# Patient Record
Sex: Female | Born: 1940 | Race: White | Hispanic: No | Marital: Single | Smoking: Never smoker
Health system: Southern US, Community
[De-identification: ages and names within clinical notes are randomized; demographics above are authoritative.]

## PROBLEM LIST (undated history)

## (undated) DIAGNOSIS — F329 Major depressive disorder, single episode, unspecified: Secondary | ICD-10-CM

## (undated) DIAGNOSIS — E079 Disorder of thyroid, unspecified: Secondary | ICD-10-CM

## (undated) DIAGNOSIS — F419 Anxiety disorder, unspecified: Secondary | ICD-10-CM

## (undated) DIAGNOSIS — K219 Gastro-esophageal reflux disease without esophagitis: Secondary | ICD-10-CM

## (undated) DIAGNOSIS — M4800 Spinal stenosis, site unspecified: Secondary | ICD-10-CM

## (undated) DIAGNOSIS — E785 Hyperlipidemia, unspecified: Secondary | ICD-10-CM

## (undated) DIAGNOSIS — F32A Depression, unspecified: Secondary | ICD-10-CM

## (undated) DIAGNOSIS — F319 Bipolar disorder, unspecified: Secondary | ICD-10-CM

## (undated) DIAGNOSIS — A6000 Herpesviral infection of urogenital system, unspecified: Secondary | ICD-10-CM

## (undated) DIAGNOSIS — E559 Vitamin D deficiency, unspecified: Secondary | ICD-10-CM

## (undated) HISTORY — PX: HAMMER TOE SURGERY: SHX385

## (undated) HISTORY — PX: NASAL SINUS SURGERY: SHX719

## (undated) HISTORY — DX: Vitamin D deficiency, unspecified: E55.9

## (undated) HISTORY — PX: ABDOMINAL HYSTERECTOMY: SHX81

## (undated) HISTORY — DX: Anxiety disorder, unspecified: F41.9

## (undated) HISTORY — DX: Hyperlipidemia, unspecified: E78.5

---

## 2010-01-07 ENCOUNTER — Ambulatory Visit: Payer: Self-pay | Admitting: Family Medicine

## 2010-02-09 ENCOUNTER — Ambulatory Visit: Payer: Self-pay | Admitting: Family Medicine

## 2010-02-26 ENCOUNTER — Ambulatory Visit: Payer: Self-pay | Admitting: Internal Medicine

## 2010-03-02 ENCOUNTER — Emergency Department: Payer: Self-pay | Admitting: Emergency Medicine

## 2011-02-28 ENCOUNTER — Other Ambulatory Visit (HOSPITAL_COMMUNITY): Payer: Self-pay | Admitting: Podiatry

## 2011-02-28 DIAGNOSIS — M79671 Pain in right foot: Secondary | ICD-10-CM

## 2011-03-09 ENCOUNTER — Encounter (HOSPITAL_COMMUNITY)
Admission: RE | Admit: 2011-03-09 | Discharge: 2011-03-09 | Disposition: A | Discharge status: Home / Self Care | Payer: Medicare Other | Source: Ambulatory Visit | Attending: Podiatry | Admitting: Podiatry

## 2011-03-09 ENCOUNTER — Ambulatory Visit (HOSPITAL_COMMUNITY): Payer: Medicare Other

## 2011-03-09 ENCOUNTER — Other Ambulatory Visit (HOSPITAL_COMMUNITY): Payer: Self-pay | Admitting: Podiatry

## 2011-03-09 ENCOUNTER — Ambulatory Visit (HOSPITAL_COMMUNITY)
Admission: RE | Admit: 2011-03-09 | Discharge: 2011-03-09 | Disposition: A | Discharge status: Home / Self Care | Payer: Medicare Other | Source: Ambulatory Visit | Attending: Podiatry | Admitting: Podiatry

## 2011-03-09 DIAGNOSIS — R52 Pain, unspecified: Secondary | ICD-10-CM

## 2011-03-09 DIAGNOSIS — M79609 Pain in unspecified limb: Secondary | ICD-10-CM | POA: Insufficient documentation

## 2011-03-09 DIAGNOSIS — M899 Disorder of bone, unspecified: Secondary | ICD-10-CM | POA: Insufficient documentation

## 2011-03-09 DIAGNOSIS — M79671 Pain in right foot: Secondary | ICD-10-CM

## 2011-03-09 DIAGNOSIS — M949 Disorder of cartilage, unspecified: Secondary | ICD-10-CM | POA: Insufficient documentation

## 2011-03-09 DIAGNOSIS — M79673 Pain in unspecified foot: Secondary | ICD-10-CM

## 2011-03-09 MED ORDER — TECHNETIUM TC 99M MEDRONATE IV KIT
23.4000 | PACK | Freq: Once | INTRAVENOUS | Status: AC | PRN
  Administered 2011-03-09: 23.4 via INTRAVENOUS

## 2012-03-22 ENCOUNTER — Other Ambulatory Visit: Payer: Self-pay | Admitting: Family Medicine

## 2012-03-22 DIAGNOSIS — M545 Low back pain: Secondary | ICD-10-CM

## 2012-03-27 ENCOUNTER — Other Ambulatory Visit: Payer: Medicare Other

## 2012-07-13 ENCOUNTER — Encounter (HOSPITAL_COMMUNITY): Payer: Self-pay | Admitting: Emergency Medicine

## 2012-07-13 ENCOUNTER — Emergency Department (HOSPITAL_COMMUNITY): Payer: Medicare Other

## 2012-07-13 ENCOUNTER — Emergency Department (HOSPITAL_COMMUNITY)
Admission: EM | Admit: 2012-07-13 | Discharge: 2012-07-13 | Disposition: A | Discharge status: Home / Self Care | Payer: Medicare Other | Attending: Emergency Medicine | Admitting: Emergency Medicine

## 2012-07-13 DIAGNOSIS — Z79899 Other long term (current) drug therapy: Secondary | ICD-10-CM | POA: Insufficient documentation

## 2012-07-13 DIAGNOSIS — Y9389 Activity, other specified: Secondary | ICD-10-CM | POA: Insufficient documentation

## 2012-07-13 DIAGNOSIS — W19XXXA Unspecified fall, initial encounter: Secondary | ICD-10-CM | POA: Insufficient documentation

## 2012-07-13 DIAGNOSIS — Z8659 Personal history of other mental and behavioral disorders: Secondary | ICD-10-CM | POA: Insufficient documentation

## 2012-07-13 DIAGNOSIS — S0993XA Unspecified injury of face, initial encounter: Secondary | ICD-10-CM | POA: Insufficient documentation

## 2012-07-13 DIAGNOSIS — S0990XA Unspecified injury of head, initial encounter: Secondary | ICD-10-CM | POA: Insufficient documentation

## 2012-07-13 DIAGNOSIS — Z90710 Acquired absence of both cervix and uterus: Secondary | ICD-10-CM | POA: Insufficient documentation

## 2012-07-13 DIAGNOSIS — S199XXA Unspecified injury of neck, initial encounter: Secondary | ICD-10-CM | POA: Insufficient documentation

## 2012-07-13 DIAGNOSIS — Y92009 Unspecified place in unspecified non-institutional (private) residence as the place of occurrence of the external cause: Secondary | ICD-10-CM | POA: Insufficient documentation

## 2012-07-13 HISTORY — DX: Disorder of thyroid, unspecified: E07.9

## 2012-07-13 MED ORDER — OXYCODONE-ACETAMINOPHEN 5-325 MG PO TABS
1.0000 | ORAL_TABLET | Freq: Once | ORAL | Status: AC
  Administered 2012-07-13: 1 via ORAL
  Filled 2012-07-13: qty 1

## 2012-07-13 MED ORDER — OXYCODONE-ACETAMINOPHEN 5-325 MG PO TABS: 2.0000 | ORAL_TABLET | ORAL | Status: DC | PRN

## 2012-07-13 NOTE — Progress Notes (Signed)
[facility] ED CM consulted by ED SW for assist with home health services. Pt states pcp, Pam Jackson, sent a RN to her home only for a home safety evaluation. Pt does not know the name of the agency and would prefer to use the same agency ordered previously by pcp.  She has agreed to home health RN, PT/OT/aide and SW.  Sister, Pam Jackson,  also at bedsides states she will continue to assist pt.  Pt has another sister locally but her children are not local.  Cm reviewed information for home health services, differences in home health and private duty nursing, private duty nursing agencies, Medicare admission guidelines, coverages for all levels of care discussed and rehab snf programs. CM reviewed this ED visit labs and imaging.  EDP also reviewed these results pt per sister. Pt states she lives alone CM recommend rehab snf as option for strengthening.  CM provided lists for [place] home health agencies, assisted living, snfs and private duty nursing agencies Pt noted with some confusion She told Cm she did not live in [place] but when sister came in room she stated she did live in [place] Pt is able to tell CM she is in Emergency room and today is "Friday" 1230 CM spoke with Dr Pam Jackson who reports pt as being uncooperative with her treatment plan Reports pt refused for [facility] to return to her home after first [facility] visit PCP has referred pt to neurology for memory and falling concerns. PCP agrees pt may benefit from services PCP has spoken with sister, Pam Jackson on 07/12/12 evening in details

## 2012-07-13 NOTE — ED Notes (Signed)
Per EMS chronic falls, fell yesterday-chronic right leg pain-no loss of consciousness, did not hit head-bruise on right upper chest

## 2012-07-13 NOTE — ED Provider Notes (Addendum)
History     CSN: [id number]  Arrival date & time 07/13/12  0930   First MD Initiated Contact with Patient 07/13/12 0945      Chief Complaint  Patient presents with  . Fall    (Consider location/radiation/quality/duration/timing/severity/associated sxs/prior treatment) The history is provided by the patient.  Pam Jackson is a 72 y.o. female history of hysterectomy, schizophrenia, here s/p fall. She falls frequently. Yesterday, she was at home and had a mechanical fall. + head injury and neck pain. No LOC or syncope. Not on anticoagulants. No headaches.    No past medical history on file.  Past Surgical History  Procedure Date  . Abdominal hysterectomy   . Hammer toe surgery     No family history on file.  History  Substance Use Topics  . Smoking status: Not on file  . Smokeless tobacco: Not on file  . Alcohol Use: No    OB History    Grav Para Term Preterm Abortions TAB SAB Ect Mult Living                  Review of Systems  Musculoskeletal:       Neck pain   All other systems reviewed and are negative.    Allergies  Review of patient's allergies indicates no known allergies.  Home Medications   Current Outpatient Rx  Name  Route  Sig  Dispense  Refill  . HYDROCODONE-ACETAMINOPHEN 7.5-325 MG PO TABS   Oral   Take 1 tablet by mouth every 4 (four) hours as needed.         Marland Kitchen LEVOTHYROXINE SODIUM 50 MCG PO TABS   Oral   Take 50 mcg by mouth daily.         . MELOXICAM 15 MG PO TABS   Oral   Take 15 mg by mouth daily.         Marland Kitchen METHOCARBAMOL 500 MG PO TABS   Oral   Take 500 mg by mouth 3 (three) times daily.         . TRAMADOL HCL 50 MG PO TABS   Oral   Take 50 mg by mouth every 6 (six) hours as needed.         . OXYCODONE-ACETAMINOPHEN 5-325 MG PO TABS   Oral   Take 2 tablets by mouth every 4 (four) hours as needed for pain.   15 tablet   0     BP 130/66  Pulse 85  Temp 98.4 F (36.9 C) (Oral)  Resp 18  SpO2  93%  Physical Exam  Nursing note and vitals reviewed. Constitutional: She is oriented to person, place, and time. She appears well-developed and well-nourished.       NAD   HENT:  Head: Normocephalic.  Mouth/Throat: Oropharynx is clear and moist.       No scalp hematoma   Eyes: Conjunctivae normal are normal. Pupils are equal, round, and reactive to light.  Neck: Normal range of motion.       ? Midline tenderness, dec ROM from pain   Cardiovascular: Normal rate, regular rhythm and normal heart sounds.   Pulmonary/Chest: Effort normal and breath sounds normal. No respiratory distress. She has no wheezes. She has no rales.       No palpable tenderness   Abdominal: Bowel sounds are normal. She exhibits no distension. There is no tenderness. There is no rebound.  Musculoskeletal: Normal range of motion.  Neurological: She is alert and oriented to person, place,  and time.       Nl strength and sensation throughout   Skin: Skin is warm and dry.  Psychiatric: She has a normal mood and affect. Her behavior is normal. Judgment and thought content normal.    ED Course  Procedures (including critical care time)  Labs Reviewed - No data to display Dg Chest 2 View  07/13/2012  *RADIOLOGY REPORT*  Clinical Data: Fall.  Right upper chest pain.  CHEST - 2 VIEW  Comparison: None.  Findings: Cardiac and mediastinal contours appear normal.  The lungs appear clear.  No pleural effusion is identified.  IMPRESSION:  No significant abnormality identified.   Original Report Authenticated By: Gaylyn Rong, M.D.    Ct Head Wo Contrast  07/13/2012  *RADIOLOGY REPORT*  Clinical Data:  Fall  CT HEAD WITHOUT CONTRAST CT CERVICAL SPINE WITHOUT CONTRAST  Technique:  Multidetector CT imaging of the head and cervical spine was performed following the standard protocol without intravenous contrast.  Multiplanar CT image reconstructions of the cervical spine were also generated.  Comparison:   None  CT HEAD   Findings: Chronic ischemic changes in the periventricular white matter and left basal ganglia.  Global atrophy.  No mass effect, midline shift, or acute intracranial hemorrhage. Mastoid air cells and visualized paranasal sinuses are clear.  IMPRESSION: No acute intracranial pathology.  CT CERVICAL SPINE  Findings: Advanced degenerative changes throughout the cervical spine are noted.  There is rotation of C1 upon C2.  There is severe multilevel facet arthropathy worse on the left. This involves C3-4 through C7-T1.  Anterolisthesis at C4-5 there is a 3 mm without evidence of dislocation or fracture. Anterior displacement of the right C4 articular facet with respect to that of C5 is present which may be chronic.  Severe narrowing of the C5-6 and C6-7 is present.  Posterior osteophytes occur at these levels.  Right foraminal stenosis occurs at C6-7 secondary to uncovertebral osteophytes.  IMPRESSION: No evidence of acute fracture in the cervical spine.  There is a 3 mm anterolisthesis of C4 upon C5 as described without obvious dislocation.  Soft tissue injury with instability of the ligamentous structures cannot be excluded.  Flexion and extension views are recommended.   Original Report Authenticated By: Jolaine Click, M.D.    Ct Cervical Spine Wo Contrast  07/13/2012  *RADIOLOGY REPORT*  Clinical Data:  Fall  CT HEAD WITHOUT CONTRAST CT CERVICAL SPINE WITHOUT CONTRAST  Technique:  Multidetector CT imaging of the head and cervical spine was performed following the standard protocol without intravenous contrast.  Multiplanar CT image reconstructions of the cervical spine were also generated.  Comparison:   None  CT HEAD  Findings: Chronic ischemic changes in the periventricular white matter and left basal ganglia.  Global atrophy.  No mass effect, midline shift, or acute intracranial hemorrhage. Mastoid air cells and visualized paranasal sinuses are clear.  IMPRESSION: No acute intracranial pathology.  CT CERVICAL  SPINE  Findings: Advanced degenerative changes throughout the cervical spine are noted.  There is rotation of C1 upon C2.  There is severe multilevel facet arthropathy worse on the left. This involves C3-4 through C7-T1.  Anterolisthesis at C4-5 there is a 3 mm without evidence of dislocation or fracture. Anterior displacement of the right C4 articular facet with respect to that of C5 is present which may be chronic.  Severe narrowing of the C5-6 and C6-7 is present.  Posterior osteophytes occur at these levels.  Right foraminal stenosis occurs at C6-7 secondary to  uncovertebral osteophytes.  IMPRESSION: No evidence of acute fracture in the cervical spine.  There is a 3 mm anterolisthesis of C4 upon C5 as described without obvious dislocation.  Soft tissue injury with instability of the ligamentous structures cannot be excluded.  Flexion and extension views are recommended.   Original Report Authenticated By: Jolaine Click, M.D.      1. Fall   2. Head injury       MDM  Pam Jackson is a 72 y.o. female here with s/p fall. Will do CT head/neck. Will get CXR and give percocet and reassess.   11:46 AM Sister came in. She said that patient has been falling more frequently in the last 6 months. She has been following up with a neurosurgeon and her PMD. She is suppose to get physical therapy. She doesn't have any home health aid. I called social work to help arrange for help at home. CT head/neck showed no bleed or fracture.   12:19 PM Social work talked with family. She was given list of resources. Patient wants to go home. Will d/c home with pain meds and she has neurosurgery f/u.    1:10 PM Home health referral obtained. D/c home.   Richardean Canal, MD 07/13/12 5621  Richardean Canal, MD 07/13/12 1310

## 2012-07-13 NOTE — ED Notes (Signed)
[facility] called for transport.  

## 2012-07-13 NOTE — Discharge Instructions (Signed)
Take mobic for pain. For severe pain you can take percocet.   Follow up with your neurosurgeon.   Return to ER if you have severe pain, unable to walk, falling.

## 2012-07-13 NOTE — Progress Notes (Signed)
Dr Haynes Dage confirmed [facility] health agency [facility].  Cm left messages x 2 for [facility] coordinator to complete referral for services Pt is aware [facility] can assist with further placement if she agrees from home Dr Haynes Dage states she will assist pt with "anything she needs"  Reviewed with sister it may take 1-3 days for placement once pt agrees Sister agrees to assist pt during and after this time Reports pt also has dogs that need to be cared for

## 2012-07-13 NOTE — ED Notes (Signed)
Off floor for testing 

## 2012-07-13 NOTE — ED Notes (Signed)
Bed:WA14<BR> Expected date:<BR> Expected time:<BR> Means of arrival:<BR> Comments:<BR> ems

## 2012-07-13 NOTE — Progress Notes (Signed)
sister stated pt has walker at home

## 2012-07-20 ENCOUNTER — Encounter (HOSPITAL_COMMUNITY): Payer: Self-pay | Admitting: Emergency Medicine

## 2012-07-20 ENCOUNTER — Emergency Department (HOSPITAL_COMMUNITY)
Admission: EM | Admit: 2012-07-20 | Discharge: 2012-07-20 | Disposition: A | Discharge status: Home / Self Care | Payer: Medicare Other | Attending: Emergency Medicine | Admitting: Emergency Medicine

## 2012-07-20 ENCOUNTER — Emergency Department (HOSPITAL_COMMUNITY): Payer: Medicare Other

## 2012-07-20 DIAGNOSIS — Y939 Activity, unspecified: Secondary | ICD-10-CM | POA: Insufficient documentation

## 2012-07-20 DIAGNOSIS — W010XXA Fall on same level from slipping, tripping and stumbling without subsequent striking against object, initial encounter: Secondary | ICD-10-CM | POA: Insufficient documentation

## 2012-07-20 DIAGNOSIS — Y999 Unspecified external cause status: Secondary | ICD-10-CM | POA: Insufficient documentation

## 2012-07-20 DIAGNOSIS — Y92009 Unspecified place in unspecified non-institutional (private) residence as the place of occurrence of the external cause: Secondary | ICD-10-CM | POA: Insufficient documentation

## 2012-07-20 DIAGNOSIS — Z7982 Long term (current) use of aspirin: Secondary | ICD-10-CM | POA: Insufficient documentation

## 2012-07-20 DIAGNOSIS — E079 Disorder of thyroid, unspecified: Secondary | ICD-10-CM | POA: Insufficient documentation

## 2012-07-20 DIAGNOSIS — S50319A Abrasion of unspecified elbow, initial encounter: Secondary | ICD-10-CM

## 2012-07-20 DIAGNOSIS — R109 Unspecified abdominal pain: Secondary | ICD-10-CM | POA: Insufficient documentation

## 2012-07-20 DIAGNOSIS — Z79899 Other long term (current) drug therapy: Secondary | ICD-10-CM | POA: Insufficient documentation

## 2012-07-20 DIAGNOSIS — IMO0002 Reserved for concepts with insufficient information to code with codable children: Secondary | ICD-10-CM | POA: Insufficient documentation

## 2012-07-20 DIAGNOSIS — Z90710 Acquired absence of both cervix and uterus: Secondary | ICD-10-CM | POA: Insufficient documentation

## 2012-07-20 DIAGNOSIS — S20219A Contusion of unspecified front wall of thorax, initial encounter: Secondary | ICD-10-CM | POA: Insufficient documentation

## 2012-07-20 NOTE — ED Notes (Signed)
[id number]<AV> Expected date:<BR> Expected time:<BR> Means of arrival:<BR> Comments:<BR> EMS/71 yo fall

## 2012-07-20 NOTE — ED Notes (Addendum)
Per EMS, pt tripped and fell at home.  Has an abrasion to her right elbow.  Pt states new onset abdominal pain and pain in bilateral hips. Pt denies any head pain, n/v, or dizziness.

## 2012-07-20 NOTE — ED Provider Notes (Signed)
History     CSN: [id number]  Arrival date & time 07/20/12  1308   First MD Initiated Contact with Patient 07/20/12 [phone]      Chief Complaint  Patient presents with  . Fall  . Hip Pain    (Consider location/radiation/quality/duration/timing/severity/associated sxs/prior treatment) HPI Comments: Ms. Pam Jackson presents via EMS from home for evaluation.  She fell at 1700 while letting her dog out of the house.  She is unsure what might have struck the ground first but denies any LOC.  She used emergency call device to reach EMS and was helped up off the floor.  She awoke this morning feeling very sore.  She reports diffuse soreness but also significant left lowerchest wall discomfort with deep inspiration.  Patient is a 72 y.o. female presenting with fall and hip pain. The history is provided by the patient. No language interpreter was used.  Fall The accident occurred yesterday (1700). The fall occurred while walking. Distance fallen: from standing. She landed on a hard floor. The volume of blood lost was minimal. Point of impact: pt is unsure. Pain location: chest wall. She was not ambulatory at the scene (EMS was called to the home and helped her get upright.).  Hip Pain    Past Medical History  Diagnosis Date  . Thyroid disease     Past Surgical History  Procedure Date  . Abdominal hysterectomy   . Hammer toe surgery     No family history on file.  History  Substance Use Topics  . Smoking status: Not on file  . Smokeless tobacco: Not on file  . Alcohol Use: No    OB History    Grav Para Term Preterm Abortions TAB SAB Ect Mult Living                  Review of Systems  Allergies  Review of patient's allergies indicates no known allergies.  Home Medications   Current Outpatient Rx  Name  Route  Sig  Dispense  Refill  . ASPIRIN EC 81 MG PO TBEC   Oral   Take 81 mg by mouth daily.         Marland Kitchen VITAMIN D 1000 UNITS PO TABS   Oral   Take 1,000 Units by mouth  daily.         . OMEGA-3 FATTY ACIDS 1000 MG PO CAPS   Oral   Take 1 g by mouth daily.         Marland Kitchen LEVOTHYROXINE SODIUM 50 MCG PO TABS   Oral   Take 50 mcg by mouth daily.         . OXYCODONE-ACETAMINOPHEN 5-325 MG PO TABS   Oral   Take 2 tablets by mouth every 4 (four) hours as needed for pain.   15 tablet   0   . TRAMADOL HCL 50 MG PO TABS   Oral   Take 50 mg by mouth every 6 (six) hours as needed. For pain           BP 104/71  Pulse 71  Temp 98.3 F (36.8 C) (Oral)  SpO2 98%  Physical Exam  Nursing note and vitals reviewed. Constitutional: She is oriented to person, place, and time. She appears well-developed and well-nourished. No distress. She is not intubated.  HENT:  Head: Normocephalic and atraumatic.  Right Ear: External ear normal.  Left Ear: External ear normal.  Nose: Nose normal.  Mouth/Throat: Oropharynx is clear and moist. No oropharyngeal exudate.  Eyes: Conjunctivae normal are normal. Pupils are equal, round, and reactive to light. Right eye exhibits no discharge. Left eye exhibits no discharge. No scleral icterus.  Neck: Normal range of motion. Neck supple. No JVD present. No tracheal deviation present.       No midline tenderness or step-offs.  ROM is intact.  Cardiovascular: Normal rate, regular rhythm, normal heart sounds and intact distal pulses.  Exam reveals no gallop.   No murmur heard. Pulmonary/Chest: Effort normal and breath sounds normal. No accessory muscle usage or stridor. No apnea, not tachypneic and not bradypneic. She is not intubated. No respiratory distress. She has no decreased breath sounds. She has no wheezes. She has no rhonchi. She has no rales. She exhibits tenderness and bony tenderness. She exhibits no mass, no laceration, no crepitus, no edema, no deformity, no swelling and no retraction.    Abdominal: Soft. Bowel sounds are normal. She exhibits no distension and no mass. There is no tenderness. There is no rebound and  no guarding.  Musculoskeletal: Normal range of motion. She exhibits edema (trace bilat) and tenderness.       No deformities x 4 extremities.  Note intact ROM without pain at shoulders, elbows, wrists, hips, knees, and ankles.  Lymphadenopathy:    She has no cervical adenopathy.  Neurological: She is alert and oriented to person, place, and time. No cranial nerve deficit.  Skin: Skin is warm. Abrasion, bruising and ecchymosis noted. No rash noted. She is not diaphoretic. No cyanosis or erythema. No pallor. Nails show no clubbing.          Right elbow abrasion with skin avulsion.  No active bleeding.  Note multiple bruises on arms and legs in various stages of healing.  Psychiatric: She has a normal mood and affect. Her behavior is normal.    ED Course  Procedures (including critical care time)  Labs Reviewed - No data to display Dg Chest 2 View  07/20/2012  *RADIOLOGY REPORT*  Clinical Data: Left anterior chest and rib pain after fall.  CHEST - 2 VIEW  Comparison: 07/13/2012  Findings: Shallow inspiration.  Borderline heart size.  Normal pulmonary vascularity.  No focal airspace consolidation in the lungs.  No blunting of costophrenic angles.  No pneumothorax. Visualized bones appear grossly intact.  No significant change since previous study.  IMPRESSION: No evidence of active pulmonary disease.   Original Report Authenticated By: Burman Nieves, M.D.      No diagnosis found.    MDM  Pt presents for evaluation after falling yesterday.  She appears nontoxic, note stable VS, NAD.  She has no respiratory insufficiency and no evidence of a head injury.  She also has no midline neck or back point tenderness.  The pelvis is stable.  Secondary to reproducible left, lower, lateral chest wall/rib discomfort, will obtain a chest x-ray.  1610.  Pt stable, NAD.  Note nl respiratory effort.  There are no obvious rib fractures, effusion, ptx, or infiltrate on CXR.  She refused any pain medication.   Plan discharge home.       Tobin Chad, MD 07/20/12 [phone]

## 2012-10-25 ENCOUNTER — Emergency Department (HOSPITAL_COMMUNITY): Payer: Medicare Other

## 2012-10-25 ENCOUNTER — Emergency Department (HOSPITAL_COMMUNITY)
Admission: EM | Admit: 2012-10-25 | Discharge: 2012-10-25 | Disposition: A | Discharge status: Home / Self Care | Payer: Medicare Other | Attending: Emergency Medicine | Admitting: Emergency Medicine

## 2012-10-25 DIAGNOSIS — K5900 Constipation, unspecified: Secondary | ICD-10-CM | POA: Insufficient documentation

## 2012-10-25 DIAGNOSIS — Z79899 Other long term (current) drug therapy: Secondary | ICD-10-CM | POA: Insufficient documentation

## 2012-10-25 DIAGNOSIS — R109 Unspecified abdominal pain: Secondary | ICD-10-CM

## 2012-10-25 DIAGNOSIS — R1013 Epigastric pain: Secondary | ICD-10-CM | POA: Insufficient documentation

## 2012-10-25 DIAGNOSIS — Z7982 Long term (current) use of aspirin: Secondary | ICD-10-CM | POA: Insufficient documentation

## 2012-10-25 DIAGNOSIS — E079 Disorder of thyroid, unspecified: Secondary | ICD-10-CM | POA: Insufficient documentation

## 2012-10-25 LAB — COMPREHENSIVE METABOLIC PANEL
AST: 397 U/L — ABNORMAL HIGH (ref 0–37)
Albumin: 3.2 g/dL — ABNORMAL LOW (ref 3.5–5.2)
Alkaline Phosphatase: 121 U/L — ABNORMAL HIGH (ref 39–117)
BUN: 23 mg/dL (ref 6–23)
Chloride: 106 mEq/L (ref 96–112)
Potassium: 3.2 mEq/L — ABNORMAL LOW (ref 3.5–5.1)
Sodium: 143 mEq/L (ref 135–145)
Total Bilirubin: 0.6 mg/dL (ref 0.3–1.2)
Total Protein: 6.2 g/dL (ref 6.0–8.3)

## 2012-10-25 LAB — CBC WITH DIFFERENTIAL/PLATELET
Basophils Absolute: 0 10*3/uL (ref 0.0–0.1)
Basophils Relative: 0 % (ref 0–1)
Eosinophils Absolute: 0 10*3/uL (ref 0.0–0.7)
Hemoglobin: 13 g/dL (ref 12.0–15.0)
MCH: 32.2 pg (ref 26.0–34.0)
MCHC: 33.6 g/dL (ref 30.0–36.0)
Neutro Abs: 4.4 10*3/uL (ref 1.7–7.7)
Neutrophils Relative %: 96 % — ABNORMAL HIGH (ref 43–77)
Platelets: 157 10*3/uL (ref 150–400)
RDW: 12.9 % (ref 11.5–15.5)

## 2012-10-25 LAB — URINALYSIS, ROUTINE W REFLEX MICROSCOPIC
Glucose, UA: NEGATIVE mg/dL
Ketones, ur: NEGATIVE mg/dL
Leukocytes, UA: NEGATIVE
Nitrite: NEGATIVE
Specific Gravity, Urine: >1.046 — ABNORMAL HIGH (ref 1.005–1.030)
pH: 5.5 (ref 5.0–8.0)

## 2012-10-25 LAB — LIPASE, BLOOD: Lipase: 20 U/L (ref 11–59)

## 2012-10-25 MED ORDER — HYDROCODONE-ACETAMINOPHEN 5-325 MG PO TABS: 1.0000 | ORAL_TABLET | Freq: Four times a day (QID) | ORAL | Status: DC | PRN

## 2012-10-25 MED ORDER — ONDANSETRON HCL 4 MG/2ML IJ SOLN
4.0000 mg | Freq: Once | INTRAMUSCULAR | Status: AC
  Administered 2012-10-25: 4 mg via INTRAVENOUS
  Filled 2012-10-25: qty 2

## 2012-10-25 MED ORDER — HYDROMORPHONE HCL PF 1 MG/ML IJ SOLN
0.5000 mg | Freq: Once | INTRAMUSCULAR | Status: AC
  Administered 2012-10-25: 0.5 mg via INTRAVENOUS
  Filled 2012-10-25: qty 1

## 2012-10-25 MED ORDER — SODIUM CHLORIDE 0.9 % IV BOLUS (SEPSIS)
1000.0000 mL | Freq: Once | INTRAVENOUS | Status: AC
  Administered 2012-10-25: 1000 mL via INTRAVENOUS

## 2012-10-25 MED ORDER — IOHEXOL 300 MG/ML  SOLN
100.0000 mL | Freq: Once | INTRAMUSCULAR | Status: AC | PRN
  Administered 2012-10-25: 100 mL via INTRAVENOUS

## 2012-10-25 MED ORDER — IOHEXOL 300 MG/ML  SOLN
50.0000 mL | Freq: Once | INTRAMUSCULAR | Status: AC | PRN
  Administered 2012-10-25: 50 mL via ORAL

## 2012-10-25 NOTE — ED Provider Notes (Signed)
History     CSN: [id number]  Arrival date & time 10/25/12  0700   First MD Initiated Contact with Patient 10/25/12 [phone]      Chief Complaint  Patient presents with  . Abdominal Pain  . Constipation    (Consider location/radiation/quality/duration/timing/severity/associated sxs/prior treatment) Patient is a 72 y.o. female presenting with abdominal pain and constipation. The history is provided by the patient (pt complains of abdominal pain and constipation). No language interpreter was used.  Abdominal Pain Pain location:  Epigastric Pain quality: aching   Pain radiates to:  Does not radiate Pain severity:  Moderate Onset quality:  Gradual Timing:  Intermittent Progression:  Waxing and waning Chronicity:  New Context: not alcohol use   Associated symptoms: constipation   Associated symptoms: no chest pain, no cough, no diarrhea, no fatigue and no hematuria   Constipation  Associated symptoms include abdominal pain. Pertinent negatives include no diarrhea, no hematuria, no chest pain, no headaches, no coughing and no rash.    Past Medical History  Diagnosis Date  . Thyroid disease     Past Surgical History  Procedure Laterality Date  . Abdominal hysterectomy    . Hammer toe surgery      No family history on file.  History  Substance Use Topics  . Smoking status: Not on file  . Smokeless tobacco: Not on file  . Alcohol Use: No    OB History   Grav Para Term Preterm Abortions TAB SAB Ect Mult Living                  Review of Systems  Constitutional: Negative for appetite change and fatigue.  HENT: Negative for congestion, sinus pressure and ear discharge.   Eyes: Negative for discharge.  Respiratory: Negative for cough.   Cardiovascular: Negative for chest pain.  Gastrointestinal: Positive for abdominal pain and constipation. Negative for diarrhea.  Genitourinary: Negative for frequency and hematuria.  Musculoskeletal: Negative for back pain.  Skin:  Negative for rash.  Neurological: Negative for seizures and headaches.  Psychiatric/Behavioral: Negative for hallucinations.    Allergies  Review of patient's allergies indicates no known allergies.  Home Medications   Current Outpatient Rx  Name  Route  Sig  Dispense  Refill  . aspirin EC 81 MG tablet   Oral   Take 81 mg by mouth daily.         . cholecalciferol (VITAMIN D) 1000 UNITS tablet   Oral   Take 1,000 Units by mouth daily.         . diphenhydramine-acetaminophen (TYLENOL PM) 25-500 MG TABS   Oral   Take 1 tablet by mouth at bedtime as needed (sleep).         . divalproex (DEPAKOTE) 500 MG DR tablet   Oral   Take 1,000 mg by mouth at bedtime.         . fish oil-omega-3 fatty acids 1000 MG capsule   Oral   Take 1 g by mouth daily.         Marland Kitchen FLUoxetine (PROZAC) 20 MG tablet   Oral   Take 20 mg by mouth every morning.         . lamoTRIgine (LAMICTAL) 150 MG tablet   Oral   Take 150 mg by mouth daily.         Marland Kitchen levothyroxine (LEVOTHROID) 25 MCG tablet   Oral   Take 25 mcg by mouth daily before breakfast.         .  meloxicam (MOBIC) 15 MG tablet   Oral   Take 15 mg by mouth daily.         . pantoprazole (PROTONIX) 40 MG tablet   Oral   Take 40 mg by mouth daily.         . traMADol (ULTRAM) 50 MG tablet   Oral   Take 50 mg by mouth every 6 (six) hours as needed. For pain         . HYDROcodone-acetaminophen (NORCO/VICODIN) 5-325 MG per tablet   Oral   Take 1 tablet by mouth every 6 (six) hours as needed for pain.   20 tablet   0     BP 112/54  Pulse 96  Temp(Src) 98.1 F (36.7 C) (Oral)  Resp 18  SpO2 95%  Physical Exam  Constitutional: She is oriented to person, place, and time. She appears well-developed.  HENT:  Head: Normocephalic.  Eyes: Conjunctivae and EOM are normal. No scleral icterus.  Neck: Neck supple. No thyromegaly present.  Cardiovascular: Normal rate and regular rhythm.  Exam reveals no gallop and no  friction rub.   No murmur heard. Pulmonary/Chest: No stridor. She has no wheezes. She has no rales. She exhibits no tenderness.  Abdominal: She exhibits no distension. There is tenderness. There is no rebound.  Tender epigastric and luq  Musculoskeletal: Normal range of motion. She exhibits no edema.  Lymphadenopathy:    She has no cervical adenopathy.  Neurological: She is oriented to person, place, and time. Coordination normal.  Skin: No rash noted. No erythema.  Psychiatric: She has a normal mood and affect. Her behavior is normal.    ED Course  Procedures (including critical care time)  Labs Reviewed  CBC WITH DIFFERENTIAL - Abnormal; Notable for the following:    Neutrophils Relative 96 (*)    Lymphocytes Relative 3 (*)    Lymphs Abs 0.1 (*)    Monocytes Relative 1 (*)    All other components within normal limits  COMPREHENSIVE METABOLIC PANEL - Abnormal; Notable for the following:    Potassium 3.2 (*)    Glucose, Bld 117 (*)    Albumin 3.2 (*)    AST 397 (*)    ALT 261 (*)    Alkaline Phosphatase 121 (*)    GFR calc non Af Amer 55 (*)    GFR calc Af Amer 64 (*)    All other components within normal limits  URINALYSIS, ROUTINE W REFLEX MICROSCOPIC - Abnormal; Notable for the following:    Color, Urine AMBER (*)    Specific Gravity, Urine >1.046 (*)    Bilirubin Urine SMALL (*)    Urobilinogen, UA 2.0 (*)    All other components within normal limits  LIPASE, BLOOD   Ct Abdomen Pelvis W Contrast  10/25/2012  *RADIOLOGY REPORT*  Clinical Data: Abdominal pain and constipation.  Nausea.  CT ABDOMEN AND PELVIS WITH CONTRAST  Technique:  Multidetector CT imaging of the abdomen and pelvis was performed following the standard protocol during bolus administration of intravenous contrast.  Contrast: 50mL OMNIPAQUE IOHEXOL 300 MG/ML  SOLN, OMNIPAQUE IOHEXOL 300 MG/ML  SOLN  Comparison: No priors.  Findings:  Lung Bases: A small amount of dependent subsegmental atelectasis in  the lung bases bilaterally (right greater than left).  Abdomen/Pelvis:  The appearance of the liver, gallbladder, pancreas, spleen, bilateral adrenal glands and bilateral kidneys is unremarkable.  Extensive atherosclerosis throughout the abdominal and pelvic vasculature, without evidence of aneurysm or dissection. No significant volume  of ascites.  No pneumoperitoneum.  No pathologic distension of small bowel.  No definite pathologic lymphadenopathy identified within the abdomen or pelvis.  Status post hysterectomy.  Ovaries are not confidently identified and are likely surgically absent, or may be atrophic.  Urinary bladder is unremarkable in appearance.  Tiny umbilical hernia containing only omental fat incidentally noted.  Musculoskeletal: There are no aggressive appearing lytic or blastic lesions noted in the visualized portions of the skeleton. Multilevel degenerative disc disease, most severe at L1-L2 where there are advanced degenerative changes of the vertebral body endplates.  IMPRESSION: 1.  No acute findings in the abdomen or pelvis to account for the patient's symptoms. 2.  Atherosclerosis.   Original Report Authenticated By: Trudie Reed, M.D.      1. Abdominal pain       MDM  abd pain.  Normal ct,  pud        Benny Lennert, MD 10/25/12 1131

## 2012-10-25 NOTE — ED Notes (Signed)
[id number]<GN> Expected date:10/25/12<BR> Expected time: 6:50 AM<BR> Means of arrival:Ambulance<BR> Comments:<BR> 72 yo upper abd pain

## 2012-10-25 NOTE — ED Notes (Signed)
Pt brought in per ems for epigastric/abd pain radiating to her back. Pt states this pain started 10/24/12 at 2100. Pt also states she has been constipated x 1 week. Pt also c/o weakness.

## 2012-10-25 NOTE — ED Notes (Signed)
Patient transported to CT 

## 2012-10-26 ENCOUNTER — Emergency Department (HOSPITAL_COMMUNITY)
Admission: EM | Admit: 2012-10-26 | Discharge: 2012-10-26 | Disposition: A | Discharge status: Home / Self Care | Payer: Medicare Other | Attending: Emergency Medicine | Admitting: Emergency Medicine

## 2012-10-26 ENCOUNTER — Encounter (HOSPITAL_COMMUNITY): Payer: Self-pay

## 2012-10-26 DIAGNOSIS — B373 Candidiasis of vulva and vagina: Secondary | ICD-10-CM

## 2012-10-26 DIAGNOSIS — R300 Dysuria: Secondary | ICD-10-CM | POA: Insufficient documentation

## 2012-10-26 DIAGNOSIS — Z791 Long term (current) use of non-steroidal anti-inflammatories (NSAID): Secondary | ICD-10-CM | POA: Insufficient documentation

## 2012-10-26 DIAGNOSIS — Z8619 Personal history of other infectious and parasitic diseases: Secondary | ICD-10-CM | POA: Insufficient documentation

## 2012-10-26 DIAGNOSIS — Z79899 Other long term (current) drug therapy: Secondary | ICD-10-CM | POA: Insufficient documentation

## 2012-10-26 DIAGNOSIS — B3731 Acute candidiasis of vulva and vagina: Secondary | ICD-10-CM | POA: Insufficient documentation

## 2012-10-26 DIAGNOSIS — Z7982 Long term (current) use of aspirin: Secondary | ICD-10-CM | POA: Insufficient documentation

## 2012-10-26 DIAGNOSIS — E079 Disorder of thyroid, unspecified: Secondary | ICD-10-CM | POA: Insufficient documentation

## 2012-10-26 DIAGNOSIS — Z87440 Personal history of urinary (tract) infections: Secondary | ICD-10-CM | POA: Insufficient documentation

## 2012-10-26 LAB — URINALYSIS, ROUTINE W REFLEX MICROSCOPIC
Nitrite: NEGATIVE
Protein, ur: NEGATIVE mg/dL
Urobilinogen, UA: 1 mg/dL (ref 0.0–1.0)

## 2012-10-26 LAB — URINE MICROSCOPIC-ADD ON

## 2012-10-26 MED ORDER — CLOTRIMAZOLE 1 % EX CREA: TOPICAL_CREAM | CUTANEOUS | Status: DC

## 2012-10-26 MED ORDER — HYDROCODONE-ACETAMINOPHEN 5-325 MG PO TABS: 1.0000 | ORAL_TABLET | ORAL | Status: DC | PRN

## 2012-10-26 MED ORDER — HYDROCODONE-ACETAMINOPHEN 5-325 MG PO TABS
2.0000 | ORAL_TABLET | Freq: Once | ORAL | Status: AC
  Administered 2012-10-26: 2 via ORAL
  Filled 2012-10-26: qty 2

## 2012-10-26 NOTE — ED Provider Notes (Signed)
Medical screening examination/treatment/procedure(s) were performed by non-physician practitioner and as supervising physician I was immediately available for consultation/collaboration.  Sunnie Nielsen, MD 10/26/12 2259

## 2012-10-26 NOTE — ED Notes (Signed)
Per EMS, pt lives alone and is here from home.  Lives by herself.  Was seen this morning and dx with constipation.  Pt now states that when she is having burning in vaginal area.  States that it is worse when it urinates.  Vitals 122/84, hr 80, resp 18, sp02 100% ra, pain 10/10

## 2012-10-26 NOTE — ED Provider Notes (Signed)
History     CSN: [id number]  Arrival date & time 10/26/12  0305   First MD Initiated Contact with Patient 10/26/12 [phone]      Chief Complaint  Patient presents with  . Vaginal Pain    (Consider location/radiation/quality/duration/timing/severity/associated sxs/prior treatment) HPI History provided by pt.   Pt presents to ED complaining of vaginal pain x 2 weeks.  Associated w/ dysuria.  Has been on two rounds of abx for UTI since 10/11/12 and PCP prescribed bactroban for "severe external bacterial infection" several days ago.  Her symptoms have worsened.  No associated fever, abd pain, N/V or other GU sx. Past Medical History  Diagnosis Date  . Thyroid disease     Past Surgical History  Procedure Laterality Date  . Abdominal hysterectomy    . Hammer toe surgery      History reviewed. No pertinent family history.  History  Substance Use Topics  . Smoking status: Not on file  . Smokeless tobacco: Not on file  . Alcohol Use: No    OB History   Grav Para Term Preterm Abortions TAB SAB Ect Mult Living                  Review of Systems  All other systems reviewed and are negative.    Allergies  Review of patient's allergies indicates no known allergies.  Home Medications   Current Outpatient Rx  Name  Route  Sig  Dispense  Refill  . aspirin EC 81 MG tablet   Oral   Take 81 mg by mouth daily.         . cholecalciferol (VITAMIN D) 1000 UNITS tablet   Oral   Take 1,000 Units by mouth daily.         . diphenhydramine-acetaminophen (TYLENOL PM) 25-500 MG TABS   Oral   Take 1 tablet by mouth at bedtime as needed (sleep).         . divalproex (DEPAKOTE) 500 MG DR tablet   Oral   Take 1,000 mg by mouth at bedtime.         . fish oil-omega-3 fatty acids 1000 MG capsule   Oral   Take 1 g by mouth daily.         Marland Kitchen FLUoxetine (PROZAC) 20 MG tablet   Oral   Take 20 mg by mouth every morning.         . lamoTRIgine (LAMICTAL) 150 MG tablet    Oral   Take 150 mg by mouth daily.         Marland Kitchen levothyroxine (LEVOTHROID) 25 MCG tablet   Oral   Take 25 mcg by mouth daily before breakfast.         . meloxicam (MOBIC) 15 MG tablet   Oral   Take 15 mg by mouth daily.         . pantoprazole (PROTONIX) 40 MG tablet   Oral   Take 40 mg by mouth daily.         . traMADol (ULTRAM) 50 MG tablet   Oral   Take 50 mg by mouth every 6 (six) hours as needed. For pain         . HYDROcodone-acetaminophen (NORCO/VICODIN) 5-325 MG per tablet   Oral   Take 1 tablet by mouth every 6 (six) hours as needed for pain.   20 tablet   0     BP 112/45  Pulse 75  Temp(Src) 98.9 F (37.2 C) (Oral)  Resp  16  Ht 5\' 6"  (1.676 m)  Wt 150 lb (68.04 kg)  BMI 24.22 kg/m2  SpO2 96%  Physical Exam  Nursing note and vitals reviewed. Constitutional: She is oriented to person, place, and time. She appears well-developed and well-nourished.  Uncomfortable appearing  HENT:  Head: Normocephalic and atraumatic.  Eyes:  Normal appearance  Neck: Normal range of motion.  Cardiovascular: Normal rate and regular rhythm.   Pulmonary/Chest: Effort normal and breath sounds normal. No respiratory distress.  Abdominal: Soft. Bowel sounds are normal. She exhibits no distension and no mass. There is no tenderness. There is no rebound and no guarding.  Genitourinary:  Vulva diffusely erythematous and raw.  Labia severely ttp.    Musculoskeletal: Normal range of motion.  Neurological: She is alert and oriented to person, place, and time.  Skin: Skin is warm and dry. No rash noted.  Psychiatric: She has a normal mood and affect. Her behavior is normal.    ED Course  Procedures (including critical care time)  Labs Reviewed  URINE CULTURE  URINALYSIS, ROUTINE W REFLEX MICROSCOPIC   Ct Abdomen Pelvis W Contrast  10/25/2012  *RADIOLOGY REPORT*  Clinical Data: Abdominal pain and constipation.  Nausea.  CT ABDOMEN AND PELVIS WITH CONTRAST  Technique:   Multidetector CT imaging of the abdomen and pelvis was performed following the standard protocol during bolus administration of intravenous contrast.  Contrast: 50mL OMNIPAQUE IOHEXOL 300 MG/ML  SOLN, OMNIPAQUE IOHEXOL 300 MG/ML  SOLN  Comparison: No priors.  Findings:  Lung Bases: A small amount of dependent subsegmental atelectasis in the lung bases bilaterally (right greater than left).  Abdomen/Pelvis:  The appearance of the liver, gallbladder, pancreas, spleen, bilateral adrenal glands and bilateral kidneys is unremarkable.  Extensive atherosclerosis throughout the abdominal and pelvic vasculature, without evidence of aneurysm or dissection. No significant volume of ascites.  No pneumoperitoneum.  No pathologic distension of small bowel.  No definite pathologic lymphadenopathy identified within the abdomen or pelvis.  Status post hysterectomy.  Ovaries are not confidently identified and are likely surgically absent, or may be atrophic.  Urinary bladder is unremarkable in appearance.  Tiny umbilical hernia containing only omental fat incidentally noted.  Musculoskeletal: There are no aggressive appearing lytic or blastic lesions noted in the visualized portions of the skeleton. Multilevel degenerative disc disease, most severe at L1-L2 where there are advanced degenerative changes of the vertebral body endplates.  IMPRESSION: 1.  No acute findings in the abdomen or pelvis to account for the patient's symptoms. 2.  Atherosclerosis.   Original Report Authenticated By: Trudie Reed, M.D.      1. Vaginal candidiasis       MDM  [phone] F presents w/ vulvovaginal pain and dysuria.  Has been on two abx for UTI since 10/11/12 as well as bactroban for "severe external bacterial infection" of vagina, and sx have worsened.  Afebrile, uncomfortable appearing, erythematous/raw/tender vulva on exam.  U/A neg.  Most likely has vulvar candidiasis.  Advised pt d/c bactroban and start lotrimin cream in conjunction  with desitin.  Prescribed vicodin for pain.  She will f/u with her PCP Monday.   Return precautions discussed.         Otilio Miu, PA-C 10/26/12 [id number]

## 2012-10-26 NOTE — ED Notes (Signed)
[id number]<AV> Expected date:10/26/12<BR> Expected time: 2:55 AM<BR> Means of arrival:Ambulance<BR> Comments:<BR> 72 yo F  UTI sxs

## 2012-10-27 LAB — URINE CULTURE: Culture: NO GROWTH

## 2013-01-09 ENCOUNTER — Emergency Department (HOSPITAL_COMMUNITY)
Admission: EM | Admit: 2013-01-09 | Discharge: 2013-01-10 | Disposition: A | Discharge status: Home / Self Care | Payer: Medicare Other | Attending: Emergency Medicine | Admitting: Emergency Medicine

## 2013-01-09 DIAGNOSIS — Z79899 Other long term (current) drug therapy: Secondary | ICD-10-CM | POA: Insufficient documentation

## 2013-01-09 DIAGNOSIS — R252 Cramp and spasm: Secondary | ICD-10-CM | POA: Insufficient documentation

## 2013-01-09 DIAGNOSIS — E079 Disorder of thyroid, unspecified: Secondary | ICD-10-CM | POA: Insufficient documentation

## 2013-01-09 DIAGNOSIS — Z7982 Long term (current) use of aspirin: Secondary | ICD-10-CM | POA: Insufficient documentation

## 2013-01-09 MED ORDER — HYDROMORPHONE HCL PF 1 MG/ML IJ SOLN
1.0000 mg | Freq: Once | INTRAMUSCULAR | Status: AC
  Administered 2013-01-09: 1 mg via INTRAVENOUS
  Filled 2013-01-09: qty 1

## 2013-01-09 MED ORDER — SODIUM CHLORIDE 0.9 % IV BOLUS (SEPSIS)
500.0000 mL | Freq: Once | INTRAVENOUS | Status: AC
  Administered 2013-01-09: 500 mL via INTRAVENOUS

## 2013-01-09 MED ORDER — DIAZEPAM 5 MG/ML IJ SOLN
2.5000 mg | Freq: Once | INTRAMUSCULAR | Status: AC
  Administered 2013-01-09: 2.5 mg via INTRAVENOUS
  Filled 2013-01-09: qty 2

## 2013-01-09 NOTE — ED Notes (Signed)
[id number]<NW> Expected date:<BR> Expected time:<BR> Means of arrival:<BR> Comments:<BR> EMS/legs cramping

## 2013-01-09 NOTE — ED Provider Notes (Signed)
History    CSN: [id number] Arrival date & time 01/09/13  2252  First MD Initiated Contact with Patient 01/09/13 2319     Chief Complaint  Patient presents with  . Leg Cramps    HPI Pam Jackson is a 72 y.o. 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 Patient seen her primary care physician and was taking "muscle relaxers" without relief. Patient's pain is constant and intermittently worse as the muscles cramp up, worse in the right leg than the left leg, no individual leg swelling, no redness in her legs, no history of DVT or PE, no history of varicosities. No fevers, no chills, no chest pain, shortness of breath, abdominal pain, nausea vomiting or diarrhea.   Past Medical History  Diagnosis Date  . Thyroid disease    Past Surgical History  Procedure Laterality Date  . Abdominal hysterectomy    . Hammer toe surgery     No family history on file. History  Substance Use Topics  . Smoking status: Not on file  . Smokeless tobacco: Not on file  . Alcohol Use: No   OB History   Grav Para Term Preterm Abortions TAB SAB Ect Mult Living                 Review of Systems At least 10pt or greater review of systems completed and are negative except where specified in the HPI.  Allergies  Review of patient's allergies indicates no known allergies.  Home Medications   Current Outpatient Rx  Name  Route  Sig  Dispense  Refill  . aspirin EC 81 MG tablet   Oral   Take 81 mg by mouth daily.         . cholecalciferol (VITAMIN D) 1000 UNITS tablet   Oral   Take 1,000 Units by mouth daily.         . diphenhydramine-acetaminophen (TYLENOL PM) 25-500 MG TABS   Oral   Take 1 tablet by mouth at bedtime as needed (sleep).         . divalproex (DEPAKOTE) 500 MG DR tablet   Oral   Take 1,000 mg by mouth at bedtime.         . fish oil-omega-3 fatty acids 1000 MG capsule  Oral   Take 1 g by mouth daily.         Marland Kitchen FLUoxetine (PROZAC) 20 MG tablet   Oral   Take 20 mg by mouth every morning.         . lamoTRIgine (LAMICTAL) 150 MG tablet   Oral   Take 150 mg by mouth daily.         Marland Kitchen levothyroxine (LEVOTHROID) 25 MCG tablet   Oral   Take 25 mcg by mouth daily before breakfast.         . meloxicam (MOBIC) 15 MG tablet   Oral   Take 15 mg by mouth daily.         . pantoprazole (PROTONIX) 40 MG tablet   Oral   Take 40 mg by mouth daily.         . traMADol (ULTRAM) 50 MG tablet   Oral   Take 50 mg by mouth every 6 (six) hours as needed. For pain          BP 115/60  Pulse 79  Temp(Src) 98.6 F (37 C) (Oral)  Resp 16  SpO2 95%  Physical Exam  Nursing notes reviewed.  Electronic medical record reviewed. VITAL SIGNS:   Filed Vitals:   01/09/13 2252 01/10/13 0213  BP: 115/60 151/81  Pulse: 79 88  Temp: 98.6 F (37 C) 98.3 F (36.8 C)  TempSrc: Oral Oral  Resp: 16 18  SpO2: 95% 96%   CONSTITUTIONAL: Awake, oriented, appears non-toxic HENT: Atraumatic, normocephalic, oral mucosa pink and moist, airway patent. Nares patent without drainage. External ears normal. EYES: Conjunctiva clear, EOMI, PERRLA NECK: Trachea midline, non-tender, supple CARDIOVASCULAR: Normal heart rate, Normal rhythm, No murmurs, rubs, gallops PULMONARY/CHEST: Clear to auscultation, no rhonchi, wheezes, or rales. Symmetrical breath sounds. Non-tender. ABDOMINAL: Non-distended, soft, non-tender - no rebound or guarding.  BS normal. NEUROLOGIC: Non-focal, moving all four extremities, no gross sensory or motor deficits. EXTREMITIES: No clubbing, cyanosis, or edema. No tenderness to palpation along the deep vein system. Medial aspect of right gastrocnemius muscle belly is especially tender to palpation.  No swelling in the lower extremities. No varicosities. Second toe amputation of left foot, site is clean dry and intact without erythema, pus. SKIN: Warm, Dry,  No erythema, No rash  ED Course  Procedures (including critical care time) Labs Reviewed  BASIC METABOLIC PANEL - Abnormal; Notable for the following:    Glucose, Bld 107 (*)    GFR calc non Af Amer 74 (*)    GFR calc Af Amer 85 (*)    All other components within normal limits  VALPROIC ACID LEVEL - Abnormal; Notable for the following:    Valproic Acid Lvl 26.5 (*)    All other components within normal limits  MAGNESIUM  HEMOGLOBIN AND HEMATOCRIT, BLOOD   No results found. 1. Leg cramps     MDM  Patient presenting with persistent leg cramps. Treat with pain medicine and Valium, she got good relief from this medication regimen we discharged home with some pain medicine and Valium for muscle relaxation.  I do not think this patient has a DVT, patient has had a surgery however this was not a major surgery, I think muscle cramps are more likely diagnosis giving her Wells score of -2. Do not think lower extremity ultrasounds or  d-dimer are indicated at this time. No signs of infection or cellulitis.  I explained the diagnosis and have given explicit precautions to return to the ER including any other new or worsening symptoms. The patient understands and accepts the medical plan as it's been dictated and I have answered her questions. Discharge instructions concerning home care and prescriptions have been given for muscle cramping.  The patient is STABLE and is discharged to home in good condition.   Jones Skene, MD 01/11/13 1610

## 2013-01-09 NOTE — ED Notes (Signed)
Pt BIB EMS. Pt c/o L side leg cramps for the past 2 days. Pt was given a muscle relaxer from her MD, but states it is not working. Pt able to ambulate with steady gait per EMS. Pt arrives a/o x 4 with no acute distress.

## 2013-01-10 LAB — HEMOGLOBIN AND HEMATOCRIT, BLOOD: HCT: 36.4 % (ref 36.0–46.0)

## 2013-01-10 LAB — VALPROIC ACID LEVEL: Valproic Acid Lvl: 26.5 ug/mL — ABNORMAL LOW (ref 50.0–100.0)

## 2013-01-10 LAB — BASIC METABOLIC PANEL
Chloride: 103 mEq/L (ref 96–112)
Creatinine, Ser: 0.8 mg/dL (ref 0.50–1.10)
GFR calc Af Amer: 85 mL/min — ABNORMAL LOW (ref >90)
Sodium: 139 mEq/L (ref 135–145)

## 2013-01-10 LAB — MAGNESIUM: Magnesium: 2 mg/dL (ref 1.5–2.5)

## 2013-01-10 MED ORDER — HYDROCODONE-ACETAMINOPHEN 5-325 MG PO TABS: 1.0000 | ORAL_TABLET | Freq: Four times a day (QID) | ORAL | Status: DC | PRN

## 2013-01-10 MED ORDER — DIAZEPAM 5 MG/ML IJ SOLN
2.5000 mg | Freq: Once | INTRAMUSCULAR | Status: AC
  Administered 2013-01-10: 2.5 mg via INTRAVENOUS
  Filled 2013-01-10: qty 2

## 2013-01-10 MED ORDER — DIAZEPAM 5 MG PO TABS: 5.0000 mg | ORAL_TABLET | Freq: Two times a day (BID) | ORAL | Status: DC

## 2013-01-19 ENCOUNTER — Encounter (HOSPITAL_COMMUNITY): Payer: Self-pay | Admitting: *Deleted

## 2013-01-19 ENCOUNTER — Emergency Department (HOSPITAL_COMMUNITY)
Admission: EM | Admit: 2013-01-19 | Discharge: 2013-01-19 | Disposition: A | Discharge status: Home / Self Care | Payer: Medicare Other | Attending: Emergency Medicine | Admitting: Emergency Medicine

## 2013-01-19 DIAGNOSIS — E079 Disorder of thyroid, unspecified: Secondary | ICD-10-CM

## 2013-01-19 DIAGNOSIS — R252 Cramp and spasm: Secondary | ICD-10-CM

## 2013-01-19 DIAGNOSIS — Y939 Activity, unspecified: Secondary | ICD-10-CM | POA: Insufficient documentation

## 2013-01-19 DIAGNOSIS — Y929 Unspecified place or not applicable: Secondary | ICD-10-CM | POA: Insufficient documentation

## 2013-01-19 DIAGNOSIS — F3289 Other specified depressive episodes: Secondary | ICD-10-CM | POA: Insufficient documentation

## 2013-01-19 DIAGNOSIS — K219 Gastro-esophageal reflux disease without esophagitis: Secondary | ICD-10-CM

## 2013-01-19 DIAGNOSIS — F319 Bipolar disorder, unspecified: Secondary | ICD-10-CM

## 2013-01-19 DIAGNOSIS — F32A Depression, unspecified: Secondary | ICD-10-CM

## 2013-01-19 DIAGNOSIS — W19XXXA Unspecified fall, initial encounter: Secondary | ICD-10-CM | POA: Insufficient documentation

## 2013-01-19 DIAGNOSIS — Z7982 Long term (current) use of aspirin: Secondary | ICD-10-CM | POA: Insufficient documentation

## 2013-01-19 DIAGNOSIS — Z79899 Other long term (current) drug therapy: Secondary | ICD-10-CM | POA: Insufficient documentation

## 2013-01-19 DIAGNOSIS — F329 Major depressive disorder, single episode, unspecified: Secondary | ICD-10-CM

## 2013-01-19 DIAGNOSIS — S8990XA Unspecified injury of unspecified lower leg, initial encounter: Secondary | ICD-10-CM | POA: Insufficient documentation

## 2013-01-19 HISTORY — DX: Bipolar disorder, unspecified: F31.9

## 2013-01-19 HISTORY — DX: Major depressive disorder, single episode, unspecified: F32.9

## 2013-01-19 HISTORY — DX: Depression, unspecified: F32.A

## 2013-01-19 HISTORY — DX: Gastro-esophageal reflux disease without esophagitis: K21.9

## 2013-01-19 LAB — POCT I-STAT, CHEM 8
Chloride: 106 mEq/L (ref 96–112)
Chloride: 106 mEq/L (ref 96–112)
Creatinine, Ser: 0.9 mg/dL (ref 0.50–1.10)
Glucose, Bld: 115 mg/dL — ABNORMAL HIGH (ref 70–99)
HCT: 37 % (ref 36.0–46.0)
Potassium: 4.1 mEq/L (ref 3.5–5.1)
Potassium: 4.6 mEq/L (ref 3.5–5.1)
Sodium: 142 mEq/L (ref 135–145)

## 2013-01-19 LAB — CBC
Platelets: 276 10*3/uL (ref 150–400)
RBC: 3.8 MIL/uL — ABNORMAL LOW (ref 3.87–5.11)
WBC: 9 10*3/uL (ref 4.0–10.5)

## 2013-01-19 LAB — URINALYSIS, ROUTINE W REFLEX MICROSCOPIC
Hgb urine dipstick: NEGATIVE
Specific Gravity, Urine: 1.035 — ABNORMAL HIGH (ref 1.005–1.030)
Urobilinogen, UA: 1 mg/dL (ref 0.0–1.0)
pH: 5.5 (ref 5.0–8.0)

## 2013-01-19 LAB — VALPROIC ACID LEVEL: Valproic Acid Lvl: 27.6 ug/mL — ABNORMAL LOW (ref 50.0–100.0)

## 2013-01-19 LAB — CK: Total CK: 43 U/L (ref 7–177)

## 2013-01-19 MED ORDER — CYCLOBENZAPRINE HCL 10 MG PO TABS: 10.0000 mg | ORAL_TABLET | Freq: Two times a day (BID) | ORAL | Status: DC | PRN

## 2013-01-19 MED ORDER — SODIUM CHLORIDE 0.9 % IV BOLUS (SEPSIS)
500.0000 mL | Freq: Once | INTRAVENOUS | Status: AC
  Administered 2013-01-19: 500 mL via INTRAVENOUS

## 2013-01-19 MED ORDER — TRAMADOL HCL 50 MG PO TABS: 50.0000 mg | ORAL_TABLET | Freq: Four times a day (QID) | ORAL | Status: DC | PRN

## 2013-01-19 MED ORDER — DIAZEPAM 5 MG/ML IJ SOLN
2.5000 mg | Freq: Once | INTRAMUSCULAR | Status: DC
  Filled 2013-01-19: qty 2

## 2013-01-19 MED ORDER — HYDROMORPHONE HCL PF 1 MG/ML IJ SOLN
1.0000 mg | Freq: Once | INTRAMUSCULAR | Status: DC
  Filled 2013-01-19: qty 1

## 2013-01-19 MED ORDER — HYDROMORPHONE HCL PF 1 MG/ML IJ SOLN
1.0000 mg | Freq: Once | INTRAMUSCULAR | Status: AC
  Administered 2013-01-19: 1 mg via INTRAVENOUS

## 2013-01-19 NOTE — ED Notes (Signed)
CBG registered 141 on ED Glucometer

## 2013-01-19 NOTE — ED Notes (Signed)
EMS states pt called due to feeling shaky since about 9 am as well as generalized pain with movement. Pt states she does not feel safe at home due to this feeling.

## 2013-01-19 NOTE — ED Notes (Signed)
Bed:WA20<BR> Expected date:<BR> Expected time:<BR> Means of arrival:<BR> Comments:<BR> EMS

## 2013-01-19 NOTE — ED Provider Notes (Signed)
The bilateral leg pain for several months. Also complains of bilateral shoulder pain for the past several days. Pain is worse with movement. No other complaint. Had recent amputation of second left toe. Patient states that she feels unsafe at home had multiple falls in the past year. Last fall was 2 days ago. She walks with a walker. Exam alert Glasgow Coma Score 15 no distress lungs clear auscultation heart regular rate and rhythm abdomen nondistended nontender pelvis stable nontender all 4 extremities contusion abrasion or tenderness neurovascularly intact left lower extremity with amputated second toe. No redness no swelling. PT pulses 2+ bilaterally   Doug Sou, MD 01/19/13 2052

## 2013-01-19 NOTE — ED Provider Notes (Signed)
History    CSN: [id number] Arrival date & time 01/19/13  1752  First MD Initiated Contact with Patient 01/19/13 1904     Chief Complaint  Patient presents with  . Shaking  . Pain   (Consider location/radiation/quality/duration/timing/severity/associated sxs/prior Treatment) The history is provided by the patient. No language interpreter was used.  Pam Jackson is a 72 y/o F with PMHx of thyroid disease, bipolar, depression, GERD, polysubstance abuse presenting to the ED, brought in by EMS, with generalized pain all over her body, with discomfort primarily to the legs bilaterally. Patient reported that she has "aches all over" - stating that the discomfort started this morning. Patient reported that mainly both of her legs bother her - stated that they are constantly aching - stated that "legs always hurt" - that is constantly feeling muscle spasms occur. Stated that the pain gets worse when she lays still, stated that the pain improves when she is moving and when her legs are being rubbed. Patient stated that this leg discomfort has been ongoing for the past 6 months. Stated that she is being followed by her PCP, Dr. Ricci Barker - stated that she is giving her medications, but patient does not know what she is taking. Patient reported that she does not necessarily like the PCP that she is seeing. Patient reported that she has been falling a lot - not due to syncopal episodes - but, due to leg pain. Stated that she uses a cane to walk. Reported that she fell the other night, reported that she did not hit her head, denied LOC, denied dizziness or neurological deficits - patient reported that she has been falling for the past year - nothing new. Denied head injury, blurred vision, neck pain, headache, nausea, vomiting, sweating, chest pain, shortness of breath, difficulty breathing, decreased appetite, fever, chills. PCP: Dr. Ricci Barker    Past Medical History  Diagnosis Date  .  Thyroid disease   . Bipolar 1 disorder   . Depression   . GERD (gastroesophageal reflux disease)    Past Surgical History  Procedure Laterality Date  . Abdominal hysterectomy    . Hammer toe surgery     No family history on file. History  Substance Use Topics  . Smoking status: Not on file  . Smokeless tobacco: Not on file  . Alcohol Use: No   OB History   Grav Para Term Preterm Abortions TAB SAB Ect Mult Living                 Review of Systems  Constitutional: Negative for fever and chills.  HENT: Negative for trouble swallowing.   Eyes: Negative for visual disturbance.  Respiratory: Negative for chest tightness and shortness of breath.   Cardiovascular: Negative for chest pain.  Genitourinary: Negative for decreased urine volume and difficulty urinating.  Musculoskeletal: Positive for myalgias (bilateral leg pain).  Neurological: Negative for dizziness, weakness, light-headedness, numbness and headaches.    Allergies  Review of patient's allergies indicates no known allergies.  Home Medications   Current Outpatient Rx  Name  Route  Sig  Dispense  Refill  . aspirin EC 81 MG tablet   Oral   Take 81 mg by mouth daily.         . celecoxib (CELEBREX) 200 MG capsule   Oral   Take 200 mg by mouth 2 (two) times daily.         . cholecalciferol (VITAMIN D) 1000 UNITS tablet   Oral  Take 1,000 Units by mouth daily.         . diazepam (VALIUM) 5 MG tablet   Oral   Take 1 tablet (5 mg total) by mouth 2 (two) times daily.   10 tablet   0   . diphenhydramine-acetaminophen (TYLENOL PM) 25-500 MG TABS   Oral   Take 1 tablet by mouth at bedtime as needed (sleep).         . divalproex (DEPAKOTE) 500 MG DR tablet   Oral   Take 1,000 mg by mouth at bedtime.         Marland Kitchen FLUoxetine (PROZAC) 20 MG tablet   Oral   Take 20 mg by mouth every morning.         Marland Kitchen HYDROcodone-acetaminophen (NORCO/VICODIN) 5-325 MG per tablet   Oral   Take 1-2 tablets by mouth  every 6 (six) hours as needed for pain.   17 tablet   0   . lamoTRIgine (LAMICTAL) 150 MG tablet   Oral   Take 150 mg by mouth daily.         Marland Kitchen levothyroxine (LEVOTHROID) 25 MCG tablet   Oral   Take 25 mcg by mouth daily before breakfast.         . meloxicam (MOBIC) 15 MG tablet   Oral   Take 15 mg by mouth daily.         . pantoprazole (PROTONIX) 40 MG tablet   Oral   Take 40 mg by mouth daily.         . traMADol (ULTRAM) 50 MG tablet   Oral   Take 50 mg by mouth every 6 (six) hours as needed. For pain         . cyclobenzaprine (FLEXERIL) 10 MG tablet   Oral   Take 1 tablet (10 mg total) by mouth 2 (two) times daily as needed for muscle spasms.   20 tablet   0   . traMADol (ULTRAM) 50 MG tablet   Oral   Take 1 tablet (50 mg total) by mouth every 6 (six) hours as needed for pain.   15 tablet   0    BP 131/52  Pulse 86  Temp(Src) 97.9 F (36.6 C) (Oral)  Resp 18  SpO2 95% Physical Exam  Nursing note and vitals reviewed. Constitutional: She is oriented to person, place, and time. She appears well-developed and well-nourished. No distress.  HENT:  Head: Normocephalic and atraumatic.  Eyes: Conjunctivae and EOM are normal. Pupils are equal, round, and reactive to light. Right eye exhibits no discharge. Left eye exhibits no discharge.  Neck: Normal range of motion. Neck supple.  Negative neck stiffness Negative nuchal rigidity  Cardiovascular: Normal rate, regular rhythm and normal heart sounds.  Exam reveals no friction rub.   No murmur heard. Pulses:      Radial pulses are 2+ on the right side, and 2+ on the left side.       Dorsalis pedis pulses are 2+ on the right side, and 2+ on the left side.  Negative leg and ankle swelling Negative pitting edema Negative discoloration to the lower extremities Negative ulcers noted  Pulmonary/Chest: Effort normal and breath sounds normal. No respiratory distress. She has no wheezes. She has no rales.   Musculoskeletal: Normal range of motion. She exhibits tenderness.  Generalized pain to body upon palpation  Pain upon palpation to the legs bilaterally Negative pain noted on straight leg raise bilaterally  Negative swelling, erythema, inflammation, discoloration, deformities  noted to the lower extremities.   Neurological: She is alert and oriented to person, place, and time. No cranial nerve deficit or sensory deficit. She exhibits normal muscle tone. Coordination normal. GCS eye subscore is 4. GCS verbal subscore is 5. GCS motor subscore is 6.  Cranial nerves III-XII grossly intact Sensation intact to upper and lower extremities bilaterally with differentiation to sharp and dull touch Strength 5+/5+ with resistance  Patient ambulated well - steady and balanced.   Skin: Skin is warm and dry. No rash noted. She is not diaphoretic. No erythema.  Amputation of left second toe - negative sign of infection, drainage, inflammation, swelling noted. Healing well.   Psychiatric: She has a normal mood and affect. Her behavior is normal. Thought content normal.    ED Course  Procedures (including critical care time)  9:13PM Patient feeling a lot better - stated that her legs are not bothering her anymore.  Spoke with Company secretary regarding steps for Home Health Aide with nurse to visit patient since patient lives alone. Flow manager directed me to Child psychotherapist, spoke with Baxter Hire from Child psychotherapist who directed me to Nurse Case Management. Secretary consulted Nurse Case Management numerous times without success - order placed for Home Health Aid. Patient stated that her sister comes to visit her all the time and landlord lives in front of her.    Labs Reviewed  GLUCOSE, CAPILLARY - Abnormal; Notable for the following:    Glucose-Capillary 141 (*)    All other components within normal limits  URINALYSIS, ROUTINE W REFLEX MICROSCOPIC - Abnormal; Notable for the following:    Color, Urine AMBER  (*)    APPearance CLOUDY (*)    Specific Gravity, Urine 1.035 (*)    Bilirubin Urine SMALL (*)    Leukocytes, UA MODERATE (*)    All other components within normal limits  URINE MICROSCOPIC-ADD ON - Abnormal; Notable for the following:    Bacteria, UA FEW (*)    Casts HYALINE CASTS (*)    All other components within normal limits  CBC - Abnormal; Notable for the following:    RBC 3.80 (*)    All other components within normal limits  VALPROIC ACID LEVEL - Abnormal; Notable for the following:    Valproic Acid Lvl 27.6 (*)    All other components within normal limits  POCT I-STAT, CHEM 8 - Abnormal; Notable for the following:    Glucose, Bld 115 (*)    All other components within normal limits  POCT I-STAT, CHEM 8 - Abnormal; Notable for the following:    Glucose, Bld 107 (*)    All other components within normal limits  URINE CULTURE  CK   No results found. 1. Leg cramping   2. Thyroid disease   3. Bipolar disorder   4. Depression   5. GERD (gastroesophageal reflux disease)     MDM  Patient with PMHx of bipolar, depression, GERD presenting to the ED with "aches all over" and bilateral leg pain - predominantly affecting the legs. Stated that the leg pain has been ongoing for the past 6 months. Patient was recently seen in the ED 01/09/2013 for the same complaint. Negative neurological deficits noted. Pulses palpable. Full ROM to upper and lower extremities. Strength 5+/5+ with resistance. Amputated toe to the left foot - negative sign of infection noted - healing well. Doubt DVT - negative inflammation, warmth, discoloration, decreased pulses, ulcers noted.   Dr. Alden Server saw patient and assessed patient. Chem-8  negative findings. CK negative elevation. CBC negative findings. UA noted moderate amount of leukocytes, small bilirubin, hyaline casts noted - negative elevation in WBC. Valproic acid negative elevation.  Patient stable, afebrile. Pain controlled in ED setting.  Negative acute findings. Leg cramping has been ongoing for the past 6 months - ongoing issue - PCP is aware of the situation and if being followed, as per patient. Doubt cardiac or vascular issues - suspicion to be possible psychological issue. Discharged patient with muscle relaxer - discussed with patient on how to take medications. Referred patient to PCP. Discussed with patient to rest and stay hydrated. Discussed with patient to continue to monitor symptoms and if symptoms are to worsen or change to report back to the ED - strict return instructions given.  Patient agreed to plan of care, understood, all questions answered.    Raymon Mutton, PA-C 01/20/13 0150

## 2013-01-20 NOTE — ED Provider Notes (Signed)
Medical screening examination/treatment/procedure(s) were conducted as a shared visit with non-physician practitioner(s) and myself.  I personally evaluated the patient during the encounter  Vickye Astorino, MD 01/20/13 1449 

## 2013-01-23 LAB — URINE CULTURE: Colony Count: 80000

## 2013-01-24 NOTE — ED Notes (Signed)
+   urine No treatment necessary per Sharilyn Sites.

## 2013-06-30 ENCOUNTER — Encounter (HOSPITAL_COMMUNITY): Payer: Self-pay | Admitting: Emergency Medicine

## 2013-06-30 ENCOUNTER — Emergency Department (HOSPITAL_COMMUNITY)
Admission: EM | Admit: 2013-06-30 | Discharge: 2013-06-30 | Disposition: A | Discharge status: Home / Self Care | Payer: Medicare Other | Attending: Emergency Medicine | Admitting: Emergency Medicine

## 2013-06-30 DIAGNOSIS — K219 Gastro-esophageal reflux disease without esophagitis: Secondary | ICD-10-CM | POA: Insufficient documentation

## 2013-06-30 DIAGNOSIS — M79606 Pain in leg, unspecified: Secondary | ICD-10-CM

## 2013-06-30 DIAGNOSIS — Z79899 Other long term (current) drug therapy: Secondary | ICD-10-CM | POA: Insufficient documentation

## 2013-06-30 DIAGNOSIS — E079 Disorder of thyroid, unspecified: Secondary | ICD-10-CM | POA: Insufficient documentation

## 2013-06-30 DIAGNOSIS — Z7982 Long term (current) use of aspirin: Secondary | ICD-10-CM | POA: Insufficient documentation

## 2013-06-30 DIAGNOSIS — F319 Bipolar disorder, unspecified: Secondary | ICD-10-CM | POA: Insufficient documentation

## 2013-06-30 DIAGNOSIS — M79609 Pain in unspecified limb: Secondary | ICD-10-CM | POA: Insufficient documentation

## 2013-06-30 MED ORDER — OXYCODONE-ACETAMINOPHEN 5-325 MG PO TABS: 1.0000 | ORAL_TABLET | ORAL | Status: DC | PRN

## 2013-06-30 MED ORDER — OXYCODONE-ACETAMINOPHEN 5-325 MG PO TABS
1.0000 | ORAL_TABLET | Freq: Once | ORAL | Status: AC
  Administered 2013-06-30: 1 via ORAL
  Filled 2013-06-30: qty 1

## 2013-06-30 MED ORDER — DIAZEPAM 5 MG PO TABS
5.0000 mg | ORAL_TABLET | Freq: Once | ORAL | Status: AC
  Administered 2013-06-30: 5 mg via ORAL
  Filled 2013-06-30: qty 1

## 2013-06-30 NOTE — ED Notes (Signed)
PER EMS- pt picked up from home with c/o bilateral leg pain x2 days.  Pt has a hematoma on L inner thigh.  Reports falling x1 week.  Pt has been seen before for leg cramping and reports it's related to poor circulation.  Reports pain 7/10. Ambulates with walker at home. Pt alert and oriented.

## 2013-06-30 NOTE — ED Notes (Signed)
Pt given percocet, would like to wait before discharged.

## 2013-06-30 NOTE — ED Notes (Signed)
Bed: WA04 Expected date:  Expected time:  Means of arrival:  Comments: Leg pain

## 2013-06-30 NOTE — ED Notes (Signed)
MD at bedside. 

## 2013-06-30 NOTE — ED Provider Notes (Signed)
CSN: [id number]     Arrival date & time 06/30/13  4098 History   First MD Initiated Contact with Patient 06/30/13 [phone]     Chief Complaint  Patient presents with  . Leg Pain    HPI Patient reports ongoing crampy leg pain over the past several days.  She reports falling one week ago.  She's had multiple ER visits for ongoing leg cramping and seems to believe is related to "poor circulation".  Normal pulses present at bedside currently.  Patient is nursing a Physiological scientist.  She relates a home with a walker secondary to some neuropathy.  She takes tramadol at home for pain but reports this is not improving her symptoms.  She has a primary care physician but there is no clear etiology found thus far for her crampy pain.  She states her legs feel better when she rubs them.  No recent fever or chills.  No weakness of her legs that is new.  No rash noted by the patient.   Past Medical History  Diagnosis Date  . Thyroid disease   . Bipolar 1 disorder   . Depression   . GERD (gastroesophageal reflux disease)    Past Surgical History  Procedure Laterality Date  . Abdominal hysterectomy    . Hammer toe surgery     History reviewed. No pertinent family history. History  Substance Use Topics  . Smoking status: Never Smoker   . Smokeless tobacco: Not on file  . Alcohol Use: No   OB History   Grav Para Term Preterm Abortions TAB SAB Ect Mult Living                 Review of Systems  All other systems reviewed and are negative.    Allergies  Review of patient's allergies indicates no known allergies.  Home Medications   Current Outpatient Rx  Name  Route  Sig  Dispense  Refill  . aspirin EC 81 MG tablet   Oral   Take 81 mg by mouth daily.         . cholecalciferol (VITAMIN D) 1000 UNITS tablet   Oral   Take 1,000 Units by mouth daily.         . divalproex (DEPAKOTE) 500 MG DR tablet   Oral   Take 1,000 mg by mouth at bedtime.         Marland Kitchen FLUoxetine (PROZAC) 20 MG  tablet   Oral   Take 20 mg by mouth every Monday, Wednesday, and Friday.          . lamoTRIgine (LAMICTAL) 150 MG tablet   Oral   Take 150 mg by mouth daily.         Marland Kitchen levothyroxine (LEVOTHROID) 25 MCG tablet   Oral   Take 25 mcg by mouth daily before breakfast.         . pantoprazole (PROTONIX) 40 MG tablet   Oral   Take 40 mg by mouth daily.         . traMADol (ULTRAM) 50 MG tablet   Oral   Take 50 mg by mouth every 6 (six) hours as needed. For pain         . oxyCODONE-acetaminophen (PERCOCET/ROXICET) 5-325 MG per tablet   Oral   Take 1 tablet by mouth every 4 (four) hours as needed for severe pain.   20 tablet   0    BP 131/61  Pulse 77  Temp(Src) 98 F (36.7 C) (Oral)  Resp 14  SpO2 94% Physical Exam  Nursing note and vitals reviewed. Constitutional: She is oriented to person, place, and time. She appears well-developed and well-nourished. No distress.  HENT:  Head: Normocephalic and atraumatic.  Eyes: EOM are normal.  Neck: Normal range of motion.  Cardiovascular: Normal rate, regular rhythm and normal heart sounds.   Pulmonary/Chest: Effort normal and breath sounds normal.  Abdominal: Soft. She exhibits no distension. There is no tenderness.  Musculoskeletal: Normal range of motion.  Normal PT and DP pulses bilaterally.  No unilateral leg swelling.  No erythema warmth or focal tenderness of her legs.  Full range of motion bilateral ankles knees and hips.  No joint effusion is noted.  Neurological: She is alert and oriented to person, place, and time.  Skin: Skin is warm and dry.  Psychiatric: She has a normal mood and affect. Judgment normal.    ED Course  Procedures (including critical care time) Labs Review Labs Reviewed - No data to display Imaging Review No results found.  EKG Interpretation   None       MDM   1. Leg pain, diffuse, unspecified laterality    Unclear etiology for ongoing leg cramps.  This is a chronic issue and does  not appear to be the presentation of deep vein thromboses.  This does not appear to be claudication.  Normal pulses in bilateral feet.  No indication for imaging or ultrasound or arterial studies today.  PCP followup.  Pain improved in the emergency department Valium and Percocet.  Discharge in good condition    Lyanne Co, MD 06/30/13 1046

## 2013-06-30 NOTE — ED Notes (Signed)
Pt awake, alert, no apparent distress.  In w/c discharged with family member.

## 2013-08-20 ENCOUNTER — Encounter (HOSPITAL_COMMUNITY): Payer: Self-pay | Admitting: Emergency Medicine

## 2013-08-20 ENCOUNTER — Emergency Department (HOSPITAL_COMMUNITY)
Admission: EM | Admit: 2013-08-20 | Discharge: 2013-08-20 | Disposition: A | Discharge status: Home / Self Care | Payer: Medicare Other | Attending: Emergency Medicine | Admitting: Emergency Medicine

## 2013-08-20 DIAGNOSIS — IMO0001 Reserved for inherently not codable concepts without codable children: Secondary | ICD-10-CM | POA: Insufficient documentation

## 2013-08-20 DIAGNOSIS — Z7982 Long term (current) use of aspirin: Secondary | ICD-10-CM | POA: Insufficient documentation

## 2013-08-20 DIAGNOSIS — E079 Disorder of thyroid, unspecified: Secondary | ICD-10-CM | POA: Insufficient documentation

## 2013-08-20 DIAGNOSIS — Z791 Long term (current) use of non-steroidal anti-inflammatories (NSAID): Secondary | ICD-10-CM | POA: Insufficient documentation

## 2013-08-20 DIAGNOSIS — M79606 Pain in leg, unspecified: Secondary | ICD-10-CM

## 2013-08-20 DIAGNOSIS — M25569 Pain in unspecified knee: Secondary | ICD-10-CM | POA: Insufficient documentation

## 2013-08-20 DIAGNOSIS — K219 Gastro-esophageal reflux disease without esophagitis: Secondary | ICD-10-CM | POA: Insufficient documentation

## 2013-08-20 DIAGNOSIS — Z79899 Other long term (current) drug therapy: Secondary | ICD-10-CM | POA: Insufficient documentation

## 2013-08-20 DIAGNOSIS — F3130 Bipolar disorder, current episode depressed, mild or moderate severity, unspecified: Secondary | ICD-10-CM | POA: Insufficient documentation

## 2013-08-20 MED ORDER — OXYCODONE-ACETAMINOPHEN 5-325 MG PO TABS: 1.0000 | ORAL_TABLET | ORAL | Status: DC | PRN

## 2013-08-20 MED ORDER — DIAZEPAM 5 MG PO TABS
5.0000 mg | ORAL_TABLET | Freq: Once | ORAL | Status: AC
  Administered 2013-08-20: 5 mg via ORAL
  Filled 2013-08-20: qty 1

## 2013-08-20 MED ORDER — OXYCODONE-ACETAMINOPHEN 5-325 MG PO TABS
1.0000 | ORAL_TABLET | Freq: Once | ORAL | Status: AC
Start: 2013-08-20 — End: 2013-08-20
  Administered 2013-08-20: 1 via ORAL
  Filled 2013-08-20: qty 1

## 2013-08-20 NOTE — ED Provider Notes (Signed)
CSN: [id number]     Arrival date & time 08/20/13  1902 History   First MD Initiated Contact with Patient 08/20/13 1922     Chief Complaint  Patient presents with  . Leg cramps      (Consider location/radiation/quality/duration/timing/severity/associated sxs/prior Treatment) HPI Comments: Patient presents with leg cramps. She has ongoing problem with leg cramps. She states it affects both of her legs and the thighs and in the lower legs. She says it alternates between the legs and waxes and wanes in intensity. She does state that they hurt all the time however. She states today she is having pain in her right thigh area. She denies any leg swelling. She denies any fevers or chills. She's been taking Tylenol and ibuprofen at home without relief. She states her pain is related to circulation problems although she doesn't seem to have any worsening symptoms on ambulation.   Past Medical History  Diagnosis Date  . Thyroid disease   . Bipolar 1 disorder   . Depression   . GERD (gastroesophageal reflux disease)    Past Surgical History  Procedure Laterality Date  . Abdominal hysterectomy    . Hammer toe surgery     History reviewed. No pertinent family history. History  Substance Use Topics  . Smoking status: Never Smoker   . Smokeless tobacco: Not on file  . Alcohol Use: No   OB History   Grav Para Term Preterm Abortions TAB SAB Ect Mult Living                 Review of Systems  Constitutional: Negative for fever, chills, diaphoresis and fatigue.  HENT: Negative for congestion, rhinorrhea and sneezing.   Eyes: Negative.   Respiratory: Negative for cough, chest tightness and shortness of breath.   Cardiovascular: Negative for chest pain and leg swelling.  Gastrointestinal: Negative for nausea, vomiting, abdominal pain, diarrhea and blood in stool.  Genitourinary: Negative for frequency, hematuria, flank pain and difficulty urinating.  Musculoskeletal: Positive for arthralgias  and myalgias. Negative for back pain.  Skin: Negative for rash.  Neurological: Negative for dizziness, speech difficulty, weakness, numbness and headaches.      Allergies  Review of patient's allergies indicates no known allergies.  Home Medications   Current Outpatient Rx  Name  Route  Sig  Dispense  Refill  . aspirin EC 81 MG tablet   Oral   Take 81 mg by mouth daily.         . cholecalciferol (VITAMIN D) 1000 UNITS tablet   Oral   Take 1,000 Units by mouth daily.         . divalproex (DEPAKOTE) 500 MG DR tablet   Oral   Take 1,000 mg by mouth at bedtime.         Marland Kitchen FLUoxetine (PROZAC) 20 MG tablet   Oral   Take 20 mg by mouth every Monday, Wednesday, and Friday.          . lamoTRIgine (LAMICTAL) 150 MG tablet   Oral   Take 150 mg by mouth daily.         Marland Kitchen levothyroxine (LEVOTHROID) 25 MCG tablet   Oral   Take 25 mcg by mouth daily before breakfast.         . meloxicam (MOBIC) 15 MG tablet   Oral   Take 15 mg by mouth daily.         . pantoprazole (PROTONIX) 40 MG tablet   Oral   Take 40 mg  by mouth daily.         Marland Kitchen. oxyCODONE-acetaminophen (PERCOCET) 5-325 MG per tablet   Oral   Take 1 tablet by mouth every 4 (four) hours as needed.   20 tablet   0    BP 115/56  Pulse 85  Temp(Src) 98.3 F (36.8 C) (Oral)  Resp 17  SpO2 92% Physical Exam  Constitutional: She is oriented to person, place, and time. She appears well-developed and well-nourished.  HENT:  Head: Normocephalic and atraumatic.  Eyes: Pupils are equal, round, and reactive to light.  Neck: Normal range of motion. Neck supple.  Cardiovascular: Normal rate, regular rhythm and normal heart sounds.   Pulmonary/Chest: Effort normal and breath sounds normal. No respiratory distress. She has no wheezes. She has no rales. She exhibits no tenderness.  Abdominal: Soft. Bowel sounds are normal. There is no tenderness. There is no rebound and no guarding.  Musculoskeletal: Normal range  of motion. She exhibits no edema.  There is no noticeable swelling to legs. There is no rash or erythema. There is no sores noted. Dorsalis pedis and posterior tibial pulses are intact and symmetric bilaterally. She has normal sensation to the legs bilaterally. She has normal motor function in the legs.  Lymphadenopathy:    She has no cervical adenopathy.  Neurological: She is alert and oriented to person, place, and time.  Skin: Skin is warm and dry. No rash noted.  Psychiatric: She has a normal mood and affect.    ED Course  Procedures (including critical care time) Labs Review Labs Reviewed - No data to display Imaging Review No results found.  EKG Interpretation   None       MDM   Final diagnoses:  Leg pain    Patient presents for leg pain. She has an ongoing history of leg pain. She doesn't seem to have any symptoms consistent with claudication. She has normal pulses in the feet. She has no neurologic deficits. She was given Valium and Percocet and had marked relief with symptoms with this. She was discharged home with a prescription for Percocet and advised to followup with her primary care physician.    Rolan BuccoMelanie Cyenna Rebello, MD 08/20/13 2156

## 2013-08-20 NOTE — ED Notes (Signed)
Per EMS, Pt, from home, c/o R upper leg cramp starting this morning.  Pt reports that she can not walk, but she was able to stand, pivot, and sit on EMS stretcher.  [facility]

## 2013-08-20 NOTE — Discharge Instructions (Signed)
Musculoskeletal Pain °Musculoskeletal pain is muscle and boney aches and pains. These pains can occur in any part of the body. Your caregiver may treat you without knowing the cause of the pain. They may treat you if blood or urine tests, X-rays, and other tests were normal.  °CAUSES °There is often not a definite cause or reason for these pains. These pains may be caused by a type of germ (virus). The discomfort may also come from overuse. Overuse includes working out too hard when your body is not fit. Boney aches also come from weather changes. Bone is sensitive to atmospheric pressure changes. °HOME CARE INSTRUCTIONS  °· Ask when your test results will be ready. Make sure you get your test results. °· Only take over-the-counter or prescription medicines for pain, discomfort, or fever as directed by your caregiver. If you were given medications for your condition, do not drive, operate machinery or power tools, or sign legal documents for 24 hours. Do not drink alcohol. Do not take sleeping pills or other medications that may interfere with treatment. °· Continue all activities unless the activities cause more pain. When the pain lessens, slowly resume normal activities. Gradually increase the intensity and duration of the activities or exercise. °· During periods of severe pain, bed rest may be helpful. Lay or sit in any position that is comfortable. °· Putting ice on the injured area. °· Put ice in a bag. °· Place a towel between your skin and the bag. °· Leave the ice on for 15 to 20 minutes, 3 to 4 times a day. °· Follow up with your caregiver for continued problems and no reason can be found for the pain. If the pain becomes worse or does not go away, it may be necessary to repeat tests or do additional testing. Your caregiver may need to look further for a possible cause. °SEEK IMMEDIATE MEDICAL CARE IF: °· You have pain that is getting worse and is not relieved by medications. °· You develop chest pain  that is associated with shortness or breath, sweating, feeling sick to your stomach (nauseous), or throw up (vomit). °· Your pain becomes localized to the abdomen. °· You develop any new symptoms that seem different or that concern you. °MAKE SURE YOU:  °· Understand these instructions. °· Will watch your condition. °· Will get help right away if you are not doing well or get worse. °Document Released: 06/20/2005 Document Revised: 09/12/2011 Document Reviewed: 02/22/2013 °ExitCare® Patient Information ©2014 [employer]. ° °

## 2013-09-06 ENCOUNTER — Emergency Department (HOSPITAL_COMMUNITY)
Admission: EM | Admit: 2013-09-06 | Discharge: 2013-09-06 | Disposition: A | Discharge status: Home / Self Care | Payer: Medicare Other | Attending: Emergency Medicine | Admitting: Emergency Medicine

## 2013-09-06 ENCOUNTER — Encounter (HOSPITAL_COMMUNITY): Payer: Self-pay | Admitting: Emergency Medicine

## 2013-09-06 DIAGNOSIS — Z79899 Other long term (current) drug therapy: Secondary | ICD-10-CM | POA: Insufficient documentation

## 2013-09-06 DIAGNOSIS — R252 Cramp and spasm: Secondary | ICD-10-CM | POA: Insufficient documentation

## 2013-09-06 DIAGNOSIS — K219 Gastro-esophageal reflux disease without esophagitis: Secondary | ICD-10-CM | POA: Insufficient documentation

## 2013-09-06 DIAGNOSIS — F319 Bipolar disorder, unspecified: Secondary | ICD-10-CM | POA: Insufficient documentation

## 2013-09-06 DIAGNOSIS — Z791 Long term (current) use of non-steroidal anti-inflammatories (NSAID): Secondary | ICD-10-CM | POA: Insufficient documentation

## 2013-09-06 DIAGNOSIS — Z7982 Long term (current) use of aspirin: Secondary | ICD-10-CM | POA: Insufficient documentation

## 2013-09-06 DIAGNOSIS — E079 Disorder of thyroid, unspecified: Secondary | ICD-10-CM | POA: Insufficient documentation

## 2013-09-06 DIAGNOSIS — M549 Dorsalgia, unspecified: Secondary | ICD-10-CM | POA: Insufficient documentation

## 2013-09-06 MED ORDER — GABAPENTIN 100 MG PO CAPS
200.0000 mg | ORAL_CAPSULE | Freq: Once | ORAL | Status: AC
  Administered 2013-09-06: 200 mg via ORAL
  Filled 2013-09-06: qty 2

## 2013-09-06 MED ORDER — OXYCODONE-ACETAMINOPHEN 5-325 MG PO TABS: 2.0000 | ORAL_TABLET | ORAL | Status: DC | PRN

## 2013-09-06 MED ORDER — OXYCODONE-ACETAMINOPHEN 5-325 MG PO TABS
1.0000 | ORAL_TABLET | Freq: Once | ORAL | Status: AC
Start: 2013-09-06 — End: 2013-09-06
  Administered 2013-09-06: 1 via ORAL
  Filled 2013-09-06: qty 1

## 2013-09-06 NOTE — ED Notes (Signed)
Per EMS pt coming from home with c/o right leg cramping. Per EMS pt sts hx of right leg/calf pain and is on muscle relaxers for the same, however sts pain has increased lately.

## 2013-09-06 NOTE — ED Notes (Signed)
Bed: WA05 Expected date:  Expected time:  Means of arrival:  Comments: ems 

## 2013-09-06 NOTE — ED Provider Notes (Signed)
CSN: [id number]     Arrival date & time 09/06/13  [id number] History   First MD Initiated Contact with Patient 09/06/13 [phone]     Chief Complaint  Patient presents with  . Leg Pain     (Consider location/radiation/quality/duration/timing/severity/associated sxs/prior Treatment) HPI  73 year old female with history of bipolar, thyroid, depression, GERD, history of recurrent leg pain who presents complaining of leg cramping. Patient was brought here via EMS from home. Patient reports recurrent legs cramping ongoing for more than a year. However since last night she has had increase cramping and pain to her entire right leg especially to the right calf. Pain feels similar to prior muscle cramping she has had. Pain usually improves with taking Neurontin however she notice no improvement since last night despite taking her medication. She denies fever, chest pain, shortness of breath, hemoptysis, new back pain, abdominal pain, leg weakness or numbness. She denies any recent trauma. She has been evaluated for this leg complaint by her PCP. States she has a recent MRI of her back and her legs and her doctor is supposed to receive the result sometimes today. She is here because the pain is out of control. She denies any prior history of DVT, or peripheral artery disease. No history of diabetes. Denies being dehydrated.  No recent medication changes specifically no statin drugs.    Past Medical History  Diagnosis Date  . Thyroid disease   . Bipolar 1 disorder   . Depression   . GERD (gastroesophageal reflux disease)    Past Surgical History  Procedure Laterality Date  . Abdominal hysterectomy    . Hammer toe surgery     No family history on file. History  Substance Use Topics  . Smoking status: Never Smoker   . Smokeless tobacco: Not on file  . Alcohol Use: No   OB History   Grav Para Term Preterm Abortions TAB SAB Ect Mult Living                 Review of Systems  Constitutional: Negative for  fever.  Gastrointestinal: Negative for abdominal pain.  Genitourinary: Negative for dysuria.  Musculoskeletal: Positive for back pain and myalgias.  Skin: Negative for rash.  Neurological: Negative for numbness.      Allergies  Review of patient's allergies indicates no known allergies.  Home Medications   Current Outpatient Rx  Name  Route  Sig  Dispense  Refill  . aspirin EC 81 MG tablet   Oral   Take 81 mg by mouth daily.         . cholecalciferol (VITAMIN D) 1000 UNITS tablet   Oral   Take 1,000 Units by mouth daily.         . divalproex (DEPAKOTE) 500 MG DR tablet   Oral   Take 1,000 mg by mouth at bedtime.         Marland Kitchen. FLUoxetine (PROZAC) 20 MG tablet   Oral   Take 20 mg by mouth every Monday, Wednesday, and Friday.          . lamoTRIgine (LAMICTAL) 150 MG tablet   Oral   Take 150 mg by mouth daily.         Marland Kitchen. levothyroxine (LEVOTHROID) 25 MCG tablet   Oral   Take 25 mcg by mouth daily before breakfast.         . meloxicam (MOBIC) 15 MG tablet   Oral   Take 15 mg by mouth daily.         .Marland Kitchen  oxyCODONE-acetaminophen (PERCOCET) 5-325 MG per tablet   Oral   Take 1 tablet by mouth every 4 (four) hours as needed.   20 tablet   0   . pantoprazole (PROTONIX) 40 MG tablet   Oral   Take 40 mg by mouth daily.          There were no vitals taken for this visit. Physical Exam  Nursing note and vitals reviewed. Constitutional: She appears well-developed and well-nourished. No distress.  HENT:  Head: Atraumatic.  Eyes: Conjunctivae are normal.  Neck: Neck supple.  Cardiovascular: Normal rate and regular rhythm.   Pulmonary/Chest: Effort normal and breath sounds normal.  Musculoskeletal: She exhibits tenderness (Generalized tenderness throughout bilateral  lower extremities with palpation but no focal point tenderness. Normal bilateral hip flexion, extension, knee flexion, extension, and full range of ankle movement.).  Bilateral lower extremities  without palpable cords, erythema, edema.  Tenderness to bilateral calf with palpation however no evidence of rash or deformity.  Intact dorsalis pedis pulses bilaterally with brisk cap refills  Neurological: She is alert.  Patellar deep tendon reflex intact bilaterally. No foot drop, 5 out of 5 strength to bilateral lower extremities  Skin: No rash noted.  Psychiatric: She has a normal mood and affect.    ED Course  Procedures (including critical care time)  8:56 AM Patient here with acute on chronic leg cramping she has had for the past year. She is neurovascularly intact and no evidence to suggest acute trauma. Low suspicion concerning for DVT, or claudication. She has been seen in the ER for same on February 17 and at that time she was discharge with pain medication and muscle relaxant. She reports she has had MRI of her low back and her legs by a PCP, result unknown. Given that she has no acute finding concerning for acute emergent condition, we'll prescribe pain medication and will have patient followup with her PCP for further management.  10:04 AM Pt felt better after receiving pain medication.  Stable for discharge.  Care discussed with Dr. Juleen China  Labs Review Labs Reviewed - No data to display Imaging Review No results found.   EKG Interpretation None      MDM   Final diagnoses:  Bilateral leg cramps    BP 134/78  Pulse 75  Temp(Src) 98.2 F (36.8 C) (Oral)  Resp 16  SpO2 98%  I have reviewed nursing notes and vital signs. I reviewed available ER/hospitalization records thought the EMR     Fayrene Helper, [place] 09/06/13 1105

## 2013-09-06 NOTE — ED Provider Notes (Signed)
Medical screening examination/treatment/procedure(s) were performed by non-physician practitioner and as supervising physician I was immediately available for consultation/collaboration.   EKG Interpretation None       Temprance Wyre, MD 09/06/13 1654 

## 2013-09-06 NOTE — Discharge Instructions (Signed)
Leg Cramps  Leg cramps that occur during exercise can be caused by poor circulation or dehydration. However, muscle cramps that occur at rest or during the night are usually not due to any serious medical problem. Heat cramps may cause muscle spasms during hot weather.   CAUSES  There is no clear cause for muscle cramps. However, dehydration may be a factor for those who do not drink enough fluids and those who exercise in the heat. Imbalances in the level of sodium, potassium, calcium or magnesium in the muscle tissue may also be a factor. Some medications, such as water pills (diuretics), may cause loss of chemicals that the body needs (like sodium and potassium) and cause muscle cramps.  TREATMENT   · Make sure your diet has enough fluids and essential minerals for the muscle to work normally.  · Avoid strenuous exercise for several days if you have been having frequent leg cramps.  · Stretch and massage the cramped muscle for several minutes.  · Some medicines may be helpful in some patients with night cramps. Only take over-the-counter or prescription medicines as directed by your caregiver.  SEEK IMMEDIATE MEDICAL CARE IF:   · Your leg cramps become worse.  · Your foot becomes cold, numb, or blue.  Document Released: 07/28/2004 Document Revised: 09/12/2011 Document Reviewed: 07/15/2008  ExitCare® Patient Information ©2014 [employer].

## 2013-09-06 NOTE — ED Notes (Signed)
[facility] called for to retrieve patient

## 2013-09-12 ENCOUNTER — Observation Stay (HOSPITAL_COMMUNITY)
Admission: EM | Admit: 2013-09-12 | Discharge: 2013-09-13 | Disposition: A | Discharge status: Home / Self Care | Payer: Medicare Other | Attending: Internal Medicine | Admitting: Internal Medicine

## 2013-09-12 ENCOUNTER — Emergency Department (HOSPITAL_COMMUNITY): Payer: Medicare Other

## 2013-09-12 ENCOUNTER — Encounter (HOSPITAL_COMMUNITY): Payer: Self-pay | Admitting: Emergency Medicine

## 2013-09-12 DIAGNOSIS — M549 Dorsalgia, unspecified: Secondary | ICD-10-CM

## 2013-09-12 DIAGNOSIS — E039 Hypothyroidism, unspecified: Secondary | ICD-10-CM | POA: Insufficient documentation

## 2013-09-12 DIAGNOSIS — R4182 Altered mental status, unspecified: Secondary | ICD-10-CM | POA: Diagnosis present

## 2013-09-12 DIAGNOSIS — G9340 Encephalopathy, unspecified: Secondary | ICD-10-CM | POA: Diagnosis present

## 2013-09-12 DIAGNOSIS — Z90710 Acquired absence of both cervix and uterus: Secondary | ICD-10-CM | POA: Insufficient documentation

## 2013-09-12 DIAGNOSIS — M47817 Spondylosis without myelopathy or radiculopathy, lumbosacral region: Secondary | ICD-10-CM | POA: Insufficient documentation

## 2013-09-12 DIAGNOSIS — E876 Hypokalemia: Secondary | ICD-10-CM | POA: Insufficient documentation

## 2013-09-12 DIAGNOSIS — Z79899 Other long term (current) drug therapy: Secondary | ICD-10-CM | POA: Insufficient documentation

## 2013-09-12 DIAGNOSIS — G9349 Other encephalopathy: Secondary | ICD-10-CM | POA: Insufficient documentation

## 2013-09-12 DIAGNOSIS — W19XXXA Unspecified fall, initial encounter: Secondary | ICD-10-CM | POA: Insufficient documentation

## 2013-09-12 DIAGNOSIS — G319 Degenerative disease of nervous system, unspecified: Secondary | ICD-10-CM | POA: Insufficient documentation

## 2013-09-12 DIAGNOSIS — G8929 Other chronic pain: Secondary | ICD-10-CM | POA: Insufficient documentation

## 2013-09-12 DIAGNOSIS — F319 Bipolar disorder, unspecified: Secondary | ICD-10-CM | POA: Insufficient documentation

## 2013-09-12 DIAGNOSIS — N390 Urinary tract infection, site not specified: Principal | ICD-10-CM | POA: Insufficient documentation

## 2013-09-12 DIAGNOSIS — Z9089 Acquired absence of other organs: Secondary | ICD-10-CM | POA: Insufficient documentation

## 2013-09-12 DIAGNOSIS — K219 Gastro-esophageal reflux disease without esophagitis: Secondary | ICD-10-CM | POA: Insufficient documentation

## 2013-09-12 LAB — URINALYSIS, ROUTINE W REFLEX MICROSCOPIC
Bilirubin Urine: NEGATIVE
Glucose, UA: NEGATIVE mg/dL
Ketones, ur: NEGATIVE mg/dL
NITRITE: NEGATIVE
PH: 7.5 (ref 5.0–8.0)
Protein, ur: NEGATIVE mg/dL
SPECIFIC GRAVITY, URINE: 1.018 (ref 1.005–1.030)
Urobilinogen, UA: 0.2 mg/dL (ref 0.0–1.0)

## 2013-09-12 LAB — CBC WITH DIFFERENTIAL/PLATELET
BASOS PCT: 0 % (ref 0–1)
Basophils Absolute: 0 10*3/uL (ref 0.0–0.1)
EOS ABS: 0.2 10*3/uL (ref 0.0–0.7)
EOS PCT: 2 % (ref 0–5)
HCT: 39.5 % (ref 36.0–46.0)
Hemoglobin: 13.4 g/dL (ref 12.0–15.0)
LYMPHS ABS: 1.9 10*3/uL (ref 0.7–4.0)
Lymphocytes Relative: 22 % (ref 12–46)
MCH: 31.8 pg (ref 26.0–34.0)
MCHC: 33.9 g/dL (ref 30.0–36.0)
MCV: 93.8 fL (ref 78.0–100.0)
Monocytes Absolute: 0.8 10*3/uL (ref 0.1–1.0)
Monocytes Relative: 9 % (ref 3–12)
Neutro Abs: 6 10*3/uL (ref 1.7–7.7)
Neutrophils Relative %: 67 % (ref 43–77)
PLATELETS: 235 10*3/uL (ref 150–400)
RBC: 4.21 MIL/uL (ref 3.87–5.11)
RDW: 13.2 % (ref 11.5–15.5)
WBC: 8.9 10*3/uL (ref 4.0–10.5)

## 2013-09-12 LAB — URINE MICROSCOPIC-ADD ON

## 2013-09-12 LAB — COMPREHENSIVE METABOLIC PANEL
ALBUMIN: 3.9 g/dL (ref 3.5–5.2)
ALT: 12 U/L (ref 0–35)
AST: 22 U/L (ref 0–37)
Alkaline Phosphatase: 66 U/L (ref 39–117)
BUN: 14 mg/dL (ref 6–23)
CALCIUM: 9.5 mg/dL (ref 8.4–10.5)
CO2: 24 mEq/L (ref 19–32)
Chloride: 100 mEq/L (ref 96–112)
Creatinine, Ser: 0.65 mg/dL (ref 0.50–1.10)
GFR calc non Af Amer: 88 mL/min — ABNORMAL LOW (ref >90)
GLUCOSE: 105 mg/dL — AB (ref 70–99)
Potassium: 3.9 mEq/L (ref 3.7–5.3)
SODIUM: 140 meq/L (ref 137–147)
TOTAL PROTEIN: 7 g/dL (ref 6.0–8.3)
Total Bilirubin: 0.3 mg/dL (ref 0.3–1.2)

## 2013-09-12 LAB — VALPROIC ACID LEVEL: Valproic Acid Lvl: 45.3 ug/mL — ABNORMAL LOW (ref 50.0–100.0)

## 2013-09-12 MED ORDER — DEXTROSE 5 % IV SOLN
1.0000 g | Freq: Once | INTRAVENOUS | Status: AC
  Administered 2013-09-12: 1 g via INTRAVENOUS
  Filled 2013-09-12: qty 10

## 2013-09-12 MED ORDER — LEVOTHYROXINE SODIUM 25 MCG PO TABS
25.0000 ug | ORAL_TABLET | Freq: Every day | ORAL | Status: DC
  Administered 2013-09-13: 25 ug via ORAL
  Filled 2013-09-12 (×2): qty 1

## 2013-09-12 MED ORDER — OXYCODONE-ACETAMINOPHEN 5-325 MG PO TABS: 2.0000 | ORAL_TABLET | ORAL | Status: DC | PRN

## 2013-09-12 MED ORDER — SODIUM CHLORIDE 0.9 % IV SOLN: INTRAVENOUS | Status: DC

## 2013-09-12 MED ORDER — MORPHINE SULFATE 2 MG/ML IJ SOLN
1.0000 mg | INTRAMUSCULAR | Status: DC | PRN
  Administered 2013-09-12: 1 mg via INTRAVENOUS
  Filled 2013-09-12: qty 1

## 2013-09-12 MED ORDER — DIVALPROEX SODIUM 500 MG PO DR TAB
1000.0000 mg | DELAYED_RELEASE_TABLET | Freq: Every day | ORAL | Status: DC
  Administered 2013-09-12: 1000 mg via ORAL
  Filled 2013-09-12 (×2): qty 2

## 2013-09-12 MED ORDER — ACETAMINOPHEN 650 MG RE SUPP: 650.0000 mg | Freq: Four times a day (QID) | RECTAL | Status: DC | PRN

## 2013-09-12 MED ORDER — DEXTROSE 5 % IV SOLN
1.0000 g | INTRAVENOUS | Status: DC
  Filled 2013-09-12: qty 10

## 2013-09-12 MED ORDER — SODIUM CHLORIDE 0.9 % IV SOLN
INTRAVENOUS | Status: DC
  Administered 2013-09-12: 23:00:00 via INTRAVENOUS

## 2013-09-12 MED ORDER — FLUOXETINE HCL 20 MG PO CAPS
20.0000 mg | ORAL_CAPSULE | ORAL | Status: DC
  Administered 2013-09-13: 20 mg via ORAL
  Filled 2013-09-12: qty 1

## 2013-09-12 MED ORDER — ENOXAPARIN SODIUM 40 MG/0.4ML ~~LOC~~ SOLN
40.0000 mg | Freq: Every day | SUBCUTANEOUS | Status: DC
Start: 2013-09-12 — End: 2013-09-13
  Administered 2013-09-12: 40 mg via SUBCUTANEOUS
  Filled 2013-09-12 (×2): qty 0.4

## 2013-09-12 MED ORDER — PANTOPRAZOLE SODIUM 40 MG PO TBEC
40.0000 mg | DELAYED_RELEASE_TABLET | Freq: Every day | ORAL | Status: DC
  Administered 2013-09-13: 40 mg via ORAL
  Filled 2013-09-12: qty 1

## 2013-09-12 MED ORDER — NAPROXEN 500 MG PO TABS
250.0000 mg | ORAL_TABLET | Freq: Once | ORAL | Status: AC
  Administered 2013-09-12: 250 mg via ORAL
  Filled 2013-09-12: qty 1

## 2013-09-12 MED ORDER — METHOCARBAMOL 500 MG PO TABS
750.0000 mg | ORAL_TABLET | Freq: Once | ORAL | Status: AC
  Administered 2013-09-12: 750 mg via ORAL
  Filled 2013-09-12: qty 2

## 2013-09-12 MED ORDER — LAMOTRIGINE 150 MG PO TABS
150.0000 mg | ORAL_TABLET | Freq: Every day | ORAL | Status: DC
  Administered 2013-09-13: 150 mg via ORAL
  Filled 2013-09-12: qty 1

## 2013-09-12 MED ORDER — ASPIRIN EC 81 MG PO TBEC
81.0000 mg | DELAYED_RELEASE_TABLET | Freq: Every day | ORAL | Status: DC
  Administered 2013-09-13: 81 mg via ORAL
  Filled 2013-09-12: qty 1

## 2013-09-12 MED ORDER — ACETAMINOPHEN 325 MG PO TABS: 650.0000 mg | ORAL_TABLET | Freq: Four times a day (QID) | ORAL | Status: DC | PRN

## 2013-09-12 NOTE — Progress Notes (Signed)
09/12/2013 A. Melda Mermelstein RNCM 2135pm [facility] [place] [facility].  A per Margaretha GlassingLoretta, [facility].  Margaretha GlassingLoretta suggesting maybe patient has another agency.  Margaretha GlassingLoretta supplied another number for patient's sister Vincente PoliZona [phone].  Attempted to call this phone number without success as phone number was disconnected.  Discussed patient with EDP.  As per EDP patient to be admitted for Altered mental status, possible UTI.  [facility]  Hospitalist to see patient.  No further EDCM needs at this time.

## 2013-09-12 NOTE — H&P (Signed)
[facility] History and Physical  Pam QueenConstance Stidham WUJ:[id number]:[id number] DOB: 04/16/1941 DOA: 09/12/2013  Referring physician:  PCP: Sissy HoffSWAYNE,DAVID W, MD  Specialists:   Chief Complaint: Fall, back pain  HPI: Pam Jackson is a 73 y.o. female  With a history of hypothyroidism, depression, bipolar disorder that presents emergency department with complaints of lower back pain. Patient sustained a ground-level fall yesterday while at home. She states she lost her balance and fell over a table.  At this time he does complain of lower back pain which is chronic for her. She denies any chest pain, dizziness, headaches, abdominal pain.  In the emergency department, patient was treated for her lower back pain with Robaxin and naproxen.  Patient was going to be discharged by the emergency department however she became altered. She started saying bizarre things. [facility] was called for admission.  Review of Systems:  Constitutional: Denies fever, chills, diaphoresis, appetite change and fatigue.  HEENT: Denies photophobia, eye pain, redness, hearing loss, ear pain, congestion, sore throat, rhinorrhea, sneezing, mouth sores, trouble swallowing, neck pain, neck stiffness and tinnitus.   Respiratory: Denies SOB, DOE, cough, chest tightness,  and wheezing.   Cardiovascular: Denies chest pain, palpitations and leg swelling.  Gastrointestinal: Denies nausea, vomiting, abdominal pain, diarrhea, constipation, blood in stool and abdominal distention.  Genitourinary: Denies dysuria, urgency, frequency, hematuria, flank pain and difficulty urinating.  Musculoskeletal: Complains of back pain and recent fall yesterday. Skin: Denies pallor, rash and wound.  Neurological: Denies dizziness, seizures, syncope, weakness, light-headedness, numbness and headaches.  Hematological: Denies adenopathy. Easy bruising, personal or family bleeding history  Psychiatric/Behavioral: Denies suicidal ideation, mood changes,  confusion, nervousness, sleep disturbance and agitation  Past Medical History  Diagnosis Date  . Thyroid disease   . Bipolar 1 disorder   . Depression   . GERD (gastroesophageal reflux disease)    Past Surgical History  Procedure Laterality Date  . Abdominal hysterectomy    . Hammer toe surgery     Social History:  reports that she has never smoked. She has never used smokeless tobacco. She reports that she does not drink alcohol or use illicit drugs. Lives alone, uses a walker for ambulation.  No Known Allergies  History reviewed. No pertinent family history.   Prior to Admission medications   Medication Sig Start Date End Date Taking? Authorizing Provider  aspirin EC 81 MG tablet Take 81 mg by mouth daily.   Yes Historical Provider, MD  cholecalciferol (VITAMIN D) 1000 UNITS tablet Take 1,000 Units by mouth daily.   Yes Historical Provider, MD  divalproex (DEPAKOTE) 500 MG DR tablet Take 1,000 mg by mouth at bedtime.   Yes Historical Provider, MD  FLUoxetine (PROZAC) 20 MG tablet Take 20 mg by mouth every Monday, Wednesday, and Friday.    Yes Historical Provider, MD  gabapentin (NEURONTIN) 300 MG capsule Take 300 mg by mouth 3 (three) times daily.   Yes Historical Provider, MD  lamoTRIgine (LAMICTAL) 150 MG tablet Take 150 mg by mouth daily.   Yes Historical Provider, MD  levothyroxine (LEVOTHROID) 25 MCG tablet Take 25 mcg by mouth daily before breakfast.   Yes Historical Provider, MD  meloxicam (MOBIC) 15 MG tablet Take 15 mg by mouth daily.   Yes Historical Provider, MD  oxyCODONE-acetaminophen (PERCOCET/ROXICET) 5-325 MG per tablet Take 2 tablets by mouth every 4 (four) hours as needed for severe pain. 09/06/13  Yes Fayrene HelperBowie Tran, PA-C  pantoprazole (PROTONIX) 40 MG tablet Take 40 mg by mouth daily.   Yes  Historical Provider, MD   Physical Exam: Filed Vitals:   09/12/13 1956  BP: 126/56  Pulse: 83  Temp:   Resp: 16     General: Well developed, well nourished, NAD, appears  stated age  HEENT: NCAT, PERRLA, EOMI, Anicteic Sclera, mucous membranes moist.   Neck: Supple, no JVD, no masses  Cardiovascular: S1 S2 auscultated, no rubs, murmurs or gallops. Regular rate and rhythm.  Respiratory: Clear to auscultation bilaterally with equal chest rise  Abdomen: Soft, nontender, nondistended, + bowel sounds  Extremities: warm dry without cyanosis clubbing or edema  Neuro: AAOx3, takes time to answer questions, cranial nerves grossly intact. Strength 5/5 in patient's upper and lower extremities bilaterally  Skin: Without rashes exudates or nodules  Psych: Normal affect and demeanor  Labs on Admission:  Basic Metabolic Panel:  Recent Labs Lab 09/12/13 1612  NA 140  K 3.9  CL 100  CO2 24  GLUCOSE 105*  BUN 14  CREATININE 0.65  CALCIUM 9.5   Liver Function Tests:  Recent Labs Lab 09/12/13 1612  AST 22  ALT 12  ALKPHOS 66  BILITOT 0.3  PROT 7.0  ALBUMIN 3.9   No results found for this basename: LIPASE, AMYLASE,  in the last 168 hours No results found for this basename: AMMONIA,  in the last 168 hours CBC:  Recent Labs Lab 09/12/13 1612  WBC 8.9  NEUTROABS 6.0  HGB 13.4  HCT 39.5  MCV 93.8  PLT 235   Cardiac Enzymes: No results found for this basename: CKTOTAL, CKMB, CKMBINDEX, TROPONINI,  in the last 168 hours  BNP (last 3 results) No results found for this basename: PROBNP,  in the last 8760 hours CBG: No results found for this basename: GLUCAP,  in the last 168 hours  Radiological Exams on Admission: Dg Lumbar Spine Complete  09/12/2013   CLINICAL DATA:  Fall with low back pain  EXAM: LUMBAR SPINE - COMPLETE 4+ VIEW  COMPARISON:  Sagittal images of the lumbar spine from a CT scan of the abdomen and pelvis dated October 25, 2012.  FINDINGS: The lumbar vertebral bodies are preserved in height. Degenerative disc space narrowing is present at L1-2 and at L2-3 similar to that previously demonstrated. There is degenerative facet joint  change at L4-5 and L5-S1. There is no pars defect nor spondylolisthesis. The pedicles and transverse processes appear intact. There is very gentle curvature of the mid lumbar spine with the convexity toward the left which is stable. The observed portions of the sacrum are grossly intact.  IMPRESSION: 1. There is no acute lumbar spine fracture nor dislocation. 2. There are stable degenerative changes as described.   Electronically Signed   By: David  [place]   On: 09/12/2013 16:10   Ct Head Wo Contrast  09/12/2013   CLINICAL DATA:  Status post fall.  No loss of consciousness.  EXAM: CT HEAD WITHOUT CONTRAST  TECHNIQUE: Contiguous axial images were obtained from the base of the skull through the vertex without intravenous contrast.  COMPARISON:  CT HEAD W/O CM dated 07/13/2012  FINDINGS: There is no evidence of mass effect, midline shift, or extra-axial fluid collections. There is no evidence of a space-occupying lesion or intracranial hemorrhage. There is no evidence of a cortical-based area of acute infarction. There is generalized cerebral atrophy. There is periventricular white matter low attenuation likely secondary to microangiopathy.  The ventricles and sulci are appropriate for the patient's age. The basal cisterns are patent.  Visualized portions of the orbits  are unremarkable. The visualized portions of the paranasal sinuses and mastoid air cells are unremarkable.  The osseous structures are unremarkable.  IMPRESSION: No acute intracranial pathology.   Electronically Signed   By: Elige Ko   On: 09/12/2013 16:09    EKG: Independently reviewed. Sinus rhythm, rate 71, poor baseline.  Assessment/Plan  Acute encephalopathy likely secondary to urinary tract infection versus acute delirium Will admit patient to medical floor. Will conduct neuro checks every 4 hours. Will obtain a vitamin B 12, folate, TSH levels,  as well as ammonia level.  Will place her on normal saline, IV ceftriaxone. Will obtain  urine culture and await sensitivities.    Urinary tract infection UA showed 11-20 white blood cells, large leukocytes. Will place patient on ceftriaxone and await urine culture and sensitivities.  Fall CT of the head was negative. Lumbar x-rays were also negative for any acute fractures, patient does have degenerative changes. Will consult physical therapy to evaluate and treat.  Bipolar disorder versus seizure disorder Patient's valproic acid level 45.3.  Will continue her on Depakote as well as Lamictal. Spoke with Dr. Cyril Mourning via phone, who recommended to continue her current dosages with outpatient followup.  Hypothyroidism Continue her thyroxine. Will obtain TSH level.  GERD Will continue PPI.  Depression Continue fluoxetine.  Neuropathy Will hold her gabapentin at this time due to her altered mental state.  Chronic back pain Will hold her MOBIC. Will continue her home dose of oxycodone PRN.  DVT prophylaxis: Lovenox  Code Status: Full  Condition: Guarded  Family Communication: None at bedside. Admission, patients condition and plan of care including tests being ordered have been discussed with the patient and she indicates understanding and agrees with the plan and Code Status.  Disposition Plan: Admitted for observation.    Time spent: 45 minutes  Lora Glomski D.O. [facility] Pager [phone]  If 7PM-7AM, please contact night-coverage [email or web address] Password [facility] 09/12/2013, 9:50 PM

## 2013-09-12 NOTE — Progress Notes (Signed)
09/12/2013 A. Dominic Rhome [facility] [id number] Discussed patient with EDP.  EDP questioning patient's mental status. EDP concerned for patient's safety if discharged home this evening.   EDCM walked into patient's room to find patient sitting up in chair on bed pan having a bowel movement.  Patient had climbed over side rail to get to chair at bedside.  Patient stated, "I know I'm not supposed to get up by myself.  I just want to go home."  [facility] asked patient if she had someone who can stay with her tonight and patient stated, "Yes, my sister."  When [facility] if could call her sister patient replied, "No! She's out of town."  Patient assisted to bed with 2 person assist.  Patient unsteady on her feet.  Patient cleaned, applied new bed linens. EDCM attempted to call patient's sister Zona without success.  [facility] patient's sister [facility]. [facility] with EDSW.

## 2013-09-12 NOTE — Progress Notes (Signed)
ANTIBIOTIC CONSULT NOTE - INITIAL  Pharmacy Consult for Rocephin Indication: UTI  No Known Allergies  Patient Measurements:   Wt=68 kg  Vital Signs: Temp: 97.6 F (36.4 C) (03/12 2100) Temp src: Oral (03/12 2100) BP: 133/76 mmHg (03/12 2100) Pulse Rate: 74 (03/12 2100) Intake/Output from previous day:   Intake/Output from this shift:    Labs:  Recent Labs  09/12/13 1612  WBC 8.9  HGB 13.4  PLT 235  CREATININE 0.65   The CrCl is unknown because both a height and weight (above a minimum accepted value) are required for this calculation. No results found for this basename: VANCOTROUGH, VANCOPEAK, VANCORANDOM, GENTTROUGH, GENTPEAK, GENTRANDOM, TOBRATROUGH, TOBRAPEAK, TOBRARND, AMIKACINPEAK, AMIKACINTROU, AMIKACIN,  in the last 72 hours   Microbiology: No results found for this or any previous visit (from the past 720 hour(s)).  Medical History: Past Medical History  Diagnosis Date  . Thyroid disease   . Bipolar 1 disorder   . Depression   . GERD (gastroesophageal reflux disease)     Medications:  Scheduled:  . [START ON 09/13/2013] aspirin EC  81 mg Oral Daily  . cefTRIAXone (ROCEPHIN)  IV  1 g Intravenous Once  . divalproex  1,000 mg Oral QHS  . enoxaparin (LOVENOX) injection  40 mg Subcutaneous QHS  . [START ON 09/13/2013] FLUoxetine  20 mg Oral Q M,W,F  . [START ON 09/13/2013] lamoTRIgine  150 mg Oral Daily  . [START ON 09/13/2013] levothyroxine  25 mcg Oral QAC breakfast  . [START ON 09/13/2013] pantoprazole  40 mg Oral Daily   Infusions:  . sodium chloride    . sodium chloride    . sodium chloride     Assessment: 73 yo with hx of hypothyroidism, depression, bipolar c/o lower back pain.  Rocephin per Rx for UTI.  Goal of Therapy:  Treat infection  Plan:   Rocephin 1gm IV q24h  F/U cultures  Lorenza EvangelistGreen, Werner Labella R 09/12/2013,11:14 PM

## 2013-09-12 NOTE — ED Notes (Signed)
Per EMS, pt at home and fell over table.  Pt fell to floor.  Did not hit head.  No LOC.  Not witnessed.  Pt called EMS c/o back pain.  Vitals 150/90, hr 72, 90% ra, resp 20.  Does not wear oxygen at home.

## 2013-09-12 NOTE — ED Notes (Signed)
Bed: WA07 Expected date:  Expected time:  Means of arrival:  Comments: ems 

## 2013-09-12 NOTE — Progress Notes (Addendum)
CSW met with patient at bedside.  Patient reports living home alone but having the support of her sisters that she see regularly.  Patient reports multiple falls in the past and currently receiving home health services with [facility] but she was not sure of the agency.  She reports her primary support is her sister Sona Rydall.   Chesley Noon, MSW, [place], 09/12/2013 Evening Clinical Social Worker [phone]

## 2013-09-12 NOTE — Progress Notes (Signed)
   CARE MANAGEMENT ED NOTE 09/12/2013  Patient:  Pam Jackson,Pam Jackson   Account Number:  [id number]  Date Initiated:  09/12/2013  Documentation initiated by:  Radford PaxFERRERO,Rosibel Giacobbe  Subjective/Objective Assessment:   Patient presents to Ed post fall     Subjective/Objective Assessment Detail:     Action/Plan:   Action/Plan Detail:   Anticipated DC Date:       Status Recommendation to Physician:   Result of Recommendation:    Other ED Services  Consult Working Plan    DC Planning Services  Other    Choice offered to / List presented to:            Status of service:  Completed, signed off  ED Comments:   ED Comments Detail:  EDCM spoke to patient at beside.  Patient lives alone at home.  Patient currently has home health services at home but is unsure of the agency name.  "[facility] think [facility]." Patient reports she has a visiting RN, PT, OT, and  an aide.  Patient has a walker and a bedside commode at home. Patient reports she has sisters who live nearby and check on her frequently.  Patient confirms her pcp is Dr. Tally Joeavid Swayne.  EDCM provided patient with a list of private duty nursing services.  Patient thankful for resources.  No further EDCM needs at this time.

## 2013-09-12 NOTE — Progress Notes (Signed)
CSW [facility] after she witnessed the patient sitting in the chair in her room having a bowel movement.  It appears that the patient climbed over the bed rails to get to the chair as evidenced by the rails still in position.  These behaviors is inconsistent with the CSW initial assessment of the patient and this leaves to question the sudden change in presentation.  CSW attempted to contact the patient's primary contact her sister Zona with no success.  CSW collected collateral information and it appears that the information the patient previously provided can not be validated at this time.  Per reports, the patient appear disoriented and confused therefore there are safety concerns with discharge her home alone.  CSW will follow up in the AM in order to ensure safety prior to discharge.     Pam Jackson, MSW, [place]isLCSWA, 09/12/2013 Evening Clinical Social Worker [phone]

## 2013-09-12 NOTE — ED Provider Notes (Addendum)
CSN: [id number]     Arrival date & time 09/12/13  1352 History   First MD Initiated Contact with Patient 09/12/13 1503     Chief Complaint  Patient presents with  . Fall  . Back Pain     (Consider location/radiation/quality/duration/timing/severity/associated sxs/prior Treatment) HPI Patient reports last night she fell forward when she lost her balance and fell onto a coffee table. She was able to get herself up. She denies having loss of consciousness. She states when she got up this morning she started having pain in her lower back. She has had pain in her lower back before from degenerative disc disease. The pain does not radiate into her legs. She denies numbness in her legs. She states nothing she does makes the pain worse although during her exam when she moves she seemed to be more painful. She states nothing has made it feel better.  She has not taking her pain medication today. Patient states she called EMS this morning because she was hurting today.   PCP Dr Haynes Dage  Past Medical History  Diagnosis Date  . Thyroid disease   . Bipolar 1 disorder   . Depression   . GERD (gastroesophageal reflux disease)    Past Surgical History  Procedure Laterality Date  . Abdominal hysterectomy    . Hammer toe surgery     History reviewed. No pertinent family history. History  Substance Use Topics  . Smoking status: Never Smoker   . Smokeless tobacco: Never Used  . Alcohol Use: No  lives at [facility] alone Uses a walker  OB History   Grav Para Term Preterm Abortions TAB SAB Ect Mult Living                 Review of Systems  All other systems reviewed and are negative.      Allergies  Review of patient's allergies indicates no known allergies.  Home Medications   Current Outpatient Rx  Name  Route  Sig  Dispense  Refill  . aspirin EC 81 MG tablet   Oral   Take 81 mg by mouth daily.         . cholecalciferol (VITAMIN D) 1000 UNITS tablet   Oral   Take 1,000  Units by mouth daily.         . divalproex (DEPAKOTE) 500 MG DR tablet   Oral   Take 1,000 mg by mouth at bedtime.         Marland Kitchen FLUoxetine (PROZAC) 20 MG tablet   Oral   Take 20 mg by mouth every Monday, Wednesday, and Friday.          . gabapentin (NEURONTIN) 300 MG capsule   Oral   Take 300 mg by mouth 3 (three) times daily.         Marland Kitchen lamoTRIgine (LAMICTAL) 150 MG tablet   Oral   Take 150 mg by mouth daily.         Marland Kitchen levothyroxine (LEVOTHROID) 25 MCG tablet   Oral   Take 25 mcg by mouth daily before breakfast.         . meloxicam (MOBIC) 15 MG tablet   Oral   Take 15 mg by mouth daily.         Marland Kitchen oxyCODONE-acetaminophen (PERCOCET/ROXICET) 5-325 MG per tablet   Oral   Take 2 tablets by mouth every 4 (four) hours as needed for severe pain.         . pantoprazole (PROTONIX) 40 MG  tablet   Oral   Take 40 mg by mouth daily.          BP 134/59  Pulse 72  Temp(Src) 98.7 F (37.1 C) (Oral)  Resp 20  SpO2 95%  Vital signs normal   Physical Exam  Nursing note and vitals reviewed. Constitutional: She is oriented to person, place, and time. She appears well-developed and well-nourished.  Non-toxic appearance. She does not appear ill. No distress.  HENT:  Head: Normocephalic and atraumatic.  Right Ear: External ear normal.  Left Ear: External ear normal.  Nose: Nose normal. No mucosal edema or rhinorrhea.  Mouth/Throat: Oropharynx is clear and moist and mucous membranes are normal. No dental abscesses or uvula swelling.  Eyes: Conjunctivae and EOM are normal. Pupils are equal, round, and reactive to light.  Neck: Normal range of motion and full passive range of motion without pain. Neck supple.  Cardiovascular: Normal rate, regular rhythm and normal heart sounds.  Exam reveals no gallop and no friction rub.   No murmur heard. Pulmonary/Chest: Effort normal and breath sounds normal. No respiratory distress. She has no wheezes. She has no rhonchi. She has no  rales. She exhibits no tenderness and no crepitus.  Abdominal: Soft. Normal appearance and bowel sounds are normal. She exhibits no distension. There is no tenderness. There is no rebound and no guarding.  Musculoskeletal: Normal range of motion. She exhibits no edema and no tenderness.       Back:  Moves all extremities well. She denies any pain on range of motion. She has no pain to palpation in her cervical or thoracic spine. She's tender diffusely along her whole lumbar spine and over the SI joints bilaterally. She has 2 small yellow bruises on her right buttock  Neurological: She is alert and oriented to person, place, and time. She has normal strength. No cranial nerve deficit.  Skin: Skin is warm, dry and intact. No rash noted. No erythema. No pallor.  Psychiatric: She has a normal mood and affect. Her speech is normal and behavior is normal. Her mood appears not anxious.    ED Course  Procedures (including critical care time)  Medications  0.9 %  sodium chloride infusion (not administered)  cefTRIAXone (ROCEPHIN) 1 g in dextrose 5 % 50 mL IVPB (not administered)  methocarbamol (ROBAXIN) tablet 750 mg (750 mg Oral Given 09/12/13 1604)  naproxen (NAPROSYN) tablet 250 mg (250 mg Oral Given 09/12/13 1604)   The longer patient is to ED the more agitated and uncooperative she became. She seems to be disoriented and confused.  The social worker states when she went in to talk to her patient climbed over the railing and went to the bathroom to have a BM in the chair. Social worker attempted to reach her sister however she is unable to contact her. [facility] also [facility] but they state they have not seen her since last summer. Pt seems too agitated and confused to go home at this time.   21:30 Dr Catha GosselinMikhail admit to obs, AMS, UTI, team 8 Give IV rocephin   Labs Review Results for orders placed during the hospital encounter of 09/12/13  CBC WITH DIFFERENTIAL      Result Value  Ref Range   WBC 8.9  4.0 - 10.5 K/uL   RBC 4.21  3.87 - 5.11 MIL/uL   Hemoglobin 13.4  12.0 - 15.0 g/dL   HCT 16.139.5  09.636.0 - 04.546.0 %   MCV 93.8  78.0 -  100.0 fL   MCH 31.8  26.0 - 34.0 pg   MCHC 33.9  30.0 - 36.0 g/dL   RDW 16.1  09.6 - 04.5 %   Platelets 235  150 - 400 K/uL   Neutrophils Relative % 67  43 - 77 %   Neutro Abs 6.0  1.7 - 7.7 K/uL   Lymphocytes Relative 22  12 - 46 %   Lymphs Abs 1.9  0.7 - 4.0 K/uL   Monocytes Relative 9  3 - 12 %   Monocytes Absolute 0.8  0.1 - 1.0 K/uL   Eosinophils Relative 2  0 - 5 %   Eosinophils Absolute 0.2  0.0 - 0.7 K/uL   Basophils Relative 0  0 - 1 %   Basophils Absolute 0.0  0.0 - 0.1 K/uL  COMPREHENSIVE METABOLIC PANEL      Result Value Ref Range   Sodium 140  137 - 147 mEq/L   Potassium 3.9  3.7 - 5.3 mEq/L   Chloride 100  96 - 112 mEq/L   CO2 24  19 - 32 mEq/L   Glucose, Bld 105 (*) 70 - 99 mg/dL   BUN 14  6 - 23 mg/dL   Creatinine, Ser 4.09  0.50 - 1.10 mg/dL   Calcium 9.5  8.4 - 81.1 mg/dL   Total Protein 7.0  6.0 - 8.3 g/dL   Albumin 3.9  3.5 - 5.2 g/dL   AST 22  0 - 37 U/L   ALT 12  0 - 35 U/L   Alkaline Phosphatase 66  39 - 117 U/L   Total Bilirubin 0.3  0.3 - 1.2 mg/dL   GFR calc non Af Amer 88 (*) >90 mL/min   GFR calc Af Amer >90  >90 mL/min  URINALYSIS, ROUTINE W REFLEX MICROSCOPIC      Result Value Ref Range   Color, Urine YELLOW  YELLOW   APPearance CLOUDY (*) CLEAR   Specific Gravity, Urine 1.018  1.005 - 1.030   pH 7.5  5.0 - 8.0   Glucose, UA NEGATIVE  NEGATIVE mg/dL   Hgb urine dipstick TRACE (*) NEGATIVE   Bilirubin Urine NEGATIVE  NEGATIVE   Ketones, ur NEGATIVE  NEGATIVE mg/dL   Protein, ur NEGATIVE  NEGATIVE mg/dL   Urobilinogen, UA 0.2  0.0 - 1.0 mg/dL   Nitrite NEGATIVE  NEGATIVE   Leukocytes, UA LARGE (*) NEGATIVE  VALPROIC ACID LEVEL      Result Value Ref Range   Valproic Acid Lvl 45.3 (*) 50.0 - 100.0 ug/mL  URINE MICROSCOPIC-ADD ON      Result Value Ref Range   Squamous Epithelial / LPF FEW  (*) RARE   WBC, UA 11-20  <3 WBC/hpf   RBC / HPF 3-6  <3 RBC/hpf   Bacteria, UA FEW (*) RARE     Laboratory interpretation all normal except possible UTI    Imaging Review  Dg Lumbar Spine Complete  09/12/2013   CLINICAL DATA:  Fall with low back pain  EXAM: LUMBAR SPINE - COMPLETE 4+ VIEW  COMPARISON:  Sagittal images of the lumbar spine from a CT scan of the abdomen and pelvis dated October 25, 2012.  FINDINGS: The lumbar vertebral bodies are preserved in height. Degenerative disc space narrowing is present at L1-2 and at L2-3 similar to that previously demonstrated. There is degenerative facet joint change at L4-5 and L5-S1. There is no pars defect nor spondylolisthesis. The pedicles and transverse processes appear intact. There is very gentle  curvature of the mid lumbar spine with the convexity toward the left which is stable. The observed portions of the sacrum are grossly intact.  IMPRESSION: 1. There is no acute lumbar spine fracture nor dislocation. 2. There are stable degenerative changes as described.   Electronically Signed   By: David  [place]   On: 09/12/2013 16:10   Ct Head Wo Contrast  09/12/2013   CLINICAL DATA:  Status post fall.  No loss of consciousness.  EXAM: CT HEAD WITHOUT CONTRAST  TECHNIQUE: Contiguous axial images were obtained from the base of the skull through the vertex without intravenous contrast.  COMPARISON:  CT HEAD W/O CM dated 07/13/2012  FINDINGS: There is no evidence of mass effect, midline shift, or extra-axial fluid collections. There is no evidence of a space-occupying lesion or intracranial hemorrhage. There is no evidence of a cortical-based area of acute infarction. There is generalized cerebral atrophy. There is periventricular white matter low attenuation likely secondary to microangiopathy.  The ventricles and sulci are appropriate for the patient's age. The basal cisterns are patent.  Visualized portions of the orbits are unremarkable. The visualized  portions of the paranasal sinuses and mastoid air cells are unremarkable.  The osseous structures are unremarkable.  IMPRESSION: No acute intracranial pathology.   Electronically Signed   By: Elige Ko   On: 09/12/2013 16:09      EKG Interpretation   Date/Time:  Thursday September 12 2013 14:01:50 EDT Ventricular Rate:  71 PR Interval:    QRS Duration: 74 QT Interval:  364 QTC Calculation: 395 R Axis:   11 Text Interpretation:  Undetermined rhythm Low voltage, precordial leads  Borderline T abnormalities, anterior leads Baseline wander in lead(s) V3  V4 V6 Electrode noise No old tracing to compare Confirmed by Janaya Broy  MD-I,  Ia Leeb ([id number]) on 09/12/2013 10:32:16 PM      MDM   Final diagnoses:  Altered mental status  UTI (urinary tract infection)  Fall  Back pain    Plan admission   Devoria Albe, MD, Franz Dell, MD 09/12/13 8119  Ward Givens, MD 09/12/13 2238

## 2013-09-12 NOTE — Progress Notes (Signed)
09/12/2013 A. Alea Ryer RNCM 2120pm EDCM placed call to [facility] to cofirm patient's home health services.  Awaiting call back.

## 2013-09-12 NOTE — ED Notes (Signed)
Patient found climbing out of bed by case manager. 3 staff member required to get the patient back in the bed. She is very weak and unsteady. She was also incontinent of stool. Case manager asked patient if her sister could be called and the patient answered no.

## 2013-09-12 NOTE — ED Notes (Signed)
MD at bedside. Dr. Knapp at bedside.  

## 2013-09-12 NOTE — Progress Notes (Signed)
Clinical Social Work Department BRIEF PSYCHOSOCIAL ASSESSMENT 09/12/2013  Patient:  Pam Jackson, Pam Jackson     Account Number:  [id number]     [place] date:  09/12/2013  Clinical Social Worker:  Luretha Rued  Date/Time:  09/12/2013 10:25 PM  Referred by:  CSW  Date Referred:  09/12/2013 Referred for  Other - See comment   Other Referral:   Concerns of safety in the home.   Interview type:  Patient Other interview type:    PSYCHOSOCIAL DATA Living Status:  ALONE Admitted from facility:   Level of care:   Primary support name:  Pam Jackson Primary support relationship to patient:  SIBLING Degree of support available:   unknown at this time.  CSW is unable to contact the emergency contact person.    CURRENT CONCERNS  Other Concerns:    SOCIAL WORK ASSESSMENT / PLAN CSW met with patient at bedside to complete this assessment.  Patient present as alert, oriented to person, place, and situation.  Patient reports living home alone but having the support of her sisters that she see regularly.  Patient reports multiple falls in the past and currently receiving home health services with [facility] but she was not sure of the agency.  She reports her primary support is her sister Pam Jackson.    CSW was contacted by the [facility] after she witnessed the patient sitting in the chair in her room having a bowel movement.  It appears that the patient climbed over the bed rails to get to the chair as evidenced by the rails still in position.  These behaviors are inconsistent with the CSW initial assessment of the patient and this leaves to question the sudden change in presentation.  CSW attempted to contact the patient's primary contact her sister Pam with no success.  CSW collected collateral information and it appears that the information the patient previously provided can not be validated at this time.  Per reports, the patient appear disoriented and confused therefore there are safety concerns  with discharge her home alone.  CSW will follow up in the AM in order to ensure safety prior to discharge.   Assessment/plan status:  Psychosocial Support/Ongoing Assessment of Needs Other assessment/ plan:   None   Information/referral to community resources:   none    PATIENT'S/FAMILY'S RESPONSE TO PLAN OF CARE: Initially the patient thanked the social work department for support.       Chesley Noon, MSW, [place], 09/12/2013 Evening Clinical Social Worker [phone]

## 2013-09-13 LAB — CBC
HEMATOCRIT: 37.9 % (ref 36.0–46.0)
HEMOGLOBIN: 12.9 g/dL (ref 12.0–15.0)
MCH: 31.8 pg (ref 26.0–34.0)
MCHC: 34 g/dL (ref 30.0–36.0)
MCV: 93.3 fL (ref 78.0–100.0)
Platelets: 232 10*3/uL (ref 150–400)
RBC: 4.06 MIL/uL (ref 3.87–5.11)
RDW: 13.3 % (ref 11.5–15.5)
WBC: 9 10*3/uL (ref 4.0–10.5)

## 2013-09-13 LAB — AMMONIA: Ammonia: 34 umol/L (ref 11–60)

## 2013-09-13 LAB — BASIC METABOLIC PANEL
BUN: 13 mg/dL (ref 6–23)
CHLORIDE: 104 meq/L (ref 96–112)
CO2: 24 meq/L (ref 19–32)
Calcium: 9.1 mg/dL (ref 8.4–10.5)
Creatinine, Ser: 0.67 mg/dL (ref 0.50–1.10)
GFR calc non Af Amer: 87 mL/min — ABNORMAL LOW (ref >90)
Glucose, Bld: 92 mg/dL (ref 70–99)
POTASSIUM: 3.6 meq/L — AB (ref 3.7–5.3)
Sodium: 143 mEq/L (ref 137–147)

## 2013-09-13 LAB — FOLATE RBC: RBC Folate: 606 ng/mL — ABNORMAL HIGH (ref >280)

## 2013-09-13 LAB — VITAMIN B12: Vitamin B-12: 478 pg/mL (ref 211–911)

## 2013-09-13 LAB — TSH: TSH: 3.806 u[IU]/mL (ref 0.350–4.500)

## 2013-09-13 MED ORDER — LORAZEPAM 1 MG PO TABS
1.0000 mg | ORAL_TABLET | Freq: Once | ORAL | Status: AC
  Administered 2013-09-13: 1 mg via ORAL
  Filled 2013-09-13: qty 1

## 2013-09-13 MED ORDER — OXYCODONE-ACETAMINOPHEN 5-325 MG PO TABS: 1.0000 | ORAL_TABLET | Freq: Four times a day (QID) | ORAL | Status: DC | PRN

## 2013-09-13 MED ORDER — POTASSIUM CHLORIDE CRYS ER 20 MEQ PO TBCR
40.0000 meq | EXTENDED_RELEASE_TABLET | Freq: Once | ORAL | Status: AC
  Administered 2013-09-13: 40 meq via ORAL
  Filled 2013-09-13: qty 2

## 2013-09-13 MED ORDER — CEFUROXIME AXETIL 500 MG PO TABS: 500.0000 mg | ORAL_TABLET | Freq: Two times a day (BID) | ORAL | Status: DC

## 2013-09-13 NOTE — Progress Notes (Signed)
[facility]  [facility] is providing the following services: RW (to be delivered to patient's home)  If patient discharges after hours, please call [phone].   [name] 09/13/2013, 12:27 PM

## 2013-09-13 NOTE — Progress Notes (Addendum)
Clinical Social Work  Patient was discussed during progression meeting and PT reports that patient would benefit from SNF. CSW reviewed chart and met with patient at bedside. CSW introduced myself and explained role.  Patient reports she lives at home alone and that husband passed away 59 years ago. Patient has a son and dtr but does not have a close relationship with dtr. Patient has 4 siblings but 1 brother has passed away. Patient reports that sisters (Zona and Sugar) live nearby and assist as needed. Patient does not drive but reports that sisters will assist with transportation to run errands and to MD appointments. Patient reports that she does feel like a burden at times but reports she has no other friends to ask for assistance.   CSW and patient discussed patient's safety if she decides to return home alone. Patient reports she has multiple falls a week and reports close to 2-3 falls a day at times. Patient has a life alert bracelet that she wears and states, "they are so nice when they come to pick me up and last week they came 3 times in one day." CSW discussed PT recommendations for SNF placement and discussed the benefits of placement. Patient adamantly refused as soon as CSW began talking about placement. CSW asked patient if CSW could give further information before she made her decision. When talking about placement patient reported concerns such as being away from her animals, not liking being around people, and not feeling her safety was at risk. CSW reminded patient that family is caring for animals now and would be able to assist if needed and explained that CSW could work on finding a private room at [facility]. CSW asked patient to reconsider for fear that if she continues to fall she could break bones or cause head trauma but patient reports. "I'll just take my chances." Patient continues to refuse placement and reports she will return home but is agreeable to [facility]. Patient reports that she will  take the ambulance home because both of her sisters are out of town. CSW asked permission to contact sisters but patient reported, "there's no need to contact them because even if they want me to go somewhere, I'm not going."   CSW and patient spoke in more detail about home life. Patient does admit to depression and reports she was diagnosed several years ago. Patient reports she has a psychiatrist (Dr. Ysidro Evert) that prescribes Lamictal and she feels its is effective. Patient used to see a therapist on a regular basis but reports it no longer helped so she stopped attending. CSW spoke about trying a different therapist or support groups but patient refuses. Patient acknowledges that family does not understand her depression but reports she does not want to reach out to anyone else. CSW pointed out that patient's isolation and lack of motivation could be related to depression and that symptoms could improve with treatment but patient is not interested in services.  CSW alerted CM of possible HH needs. CSW verified patient's address and can arrange PTAR at DC. Patient reports she has her keys and can let herself in at DC. Patient aware of no guarantee of payment via PTAR. Patient engaged in assessment and alert and oriented throughout. CSW asked RN to call CSW when ready for transportation home.  [place], [place] [phone]

## 2013-09-13 NOTE — Care Management Note (Signed)
    Page 1 of 2   09/13/2013     12:10:34 PM   CARE MANAGEMENT NOTE 09/13/2013  Patient:  Pam Jackson,Pam Jackson   Account Number:  [id number]  Date Initiated:  09/13/2013  Documentation initiated by:  [facility]Ola Fawver  Subjective/Objective Assessment:   73 year old female admitted with falls.     Action/Plan:   Lives at home alone, PT is recommending SNF but refuses and wants to go home. HH services have been ordered.   Anticipated DC Date:  09/13/2013   Anticipated DC Plan:  HOME W HOME HEALTH SERVICES  In-house referral  Clinical Social Worker      DC Planning Services  CM consult      Choice offered to / List presented to:  C-1 Patient   DME arranged  [facility]      DME agency  [facility]     HH arranged  HH-2 PT  HH[facility]1 RN      [facility]   Status of service:  In process, will continue to follow Medicare Important Message given?  NA - LOS <3 / Initial given by admissions (If response is "NO", the following Medicare IM given date fields will be blank) Date Medicare IM given:   Date Additional Medicare IM given:    Discharge Disposition:  HOME W HOME HEALTH SERVICES  Per UR Regulation:  Reviewed for med. necessity/level of care/duration of stay  If discussed at Long Length of Stay Meetings, dates discussed:    Comments:

## 2013-09-13 NOTE — Evaluation (Signed)
Physical Therapy Evaluation Patient Details Name: Pam Jackson MRN: [id number] DOB: 05/06/1941 Today's Date: 09/13/2013 Time: [id number] PT Time Calculation (min): 45 min  PT Assessment / Plan / Recommendation History of Present Illness  admitted after a fall at home. Per pt, fell over her coffee table. Pt  has life alert and utilized it per pt. Pt has h/o falls, depression and bipolar.  Clinical Impression  Pt had significant gait deviation with R LE not advancing, lagging behind, and  significant time  Required to ambulate 80'. Pt  Lives alone with no caregivers. Pt has frequent falls. Has life alert. Pt will benefit from PT to address problems listed, safety. Pt will benefit from SNF to improve safety.    PT Assessment  Patient needs continued PT services    Follow Up Recommendations  SNF;Supervision/Assistance - 24 hour    Does the patient have the potential to tolerate intense rehabilitation      Barriers to Discharge Decreased caregiver support      Equipment Recommendations  None recommended by PT    Recommendations for Other Services OT consult   Frequency Min 3X/week    Precautions / Restrictions Precautions Precautions: Fall Restrictions Weight Bearing Restrictions: No   Pertinent Vitals/Pain Reports pain all over      Mobility  Bed Mobility Overal bed mobility: Needs Assistance Bed Mobility: Supine to Sit Supine to sit: Min guard General bed mobility comments: extra time to initiate activity and carry out. Transfers Overall transfer level: Needs assistance Equipment used: Rolling walker (2 wheeled) Transfers: Sit to/from Stand Sit to Stand: Mod assist General transfer comment: pt had much difficulty [facility]. frequent cues for UE use and for sequencing, extra time for activity. Ambulation/Gait Ambulation/Gait assistance: Min assist;Mod assist;+2 safety/equipment Ambulation Distance (Feet): 10 Feet (then 5280' with  RW.) Assistive device: Rolling walker (2 wheeled) Gait Pattern/deviations: Step-to pattern;Decreased step length - right;Decreased weight shift to right Gait velocity: very slow Gait velocity interpretation: <1.8 ft/sec, indicative of risk for recurrent falls General Gait Details: R leg does not  step to/through, tends to be abducted. At times when pt stops, RLE is noted to slide out and back- almost like a plie' movement. R  leg  would continue to be extended when she took another step w?left leg.     Exercises     PT Diagnosis: Abnormality of gait;Generalized weakness  PT Problem List: Decreased strength;Decreased activity tolerance;Decreased balance;Decreased mobility;Decreased knowledge of precautions;Decreased safety awareness;Decreased knowledge of use of DME;Decreased cognition;Decreased coordination PT Treatment Interventions: DME instruction;Gait training;Functional mobility training;Therapeutic activities;Therapeutic exercise;Patient/family education     PT Goals(Current goals can be found in the care plan section) Acute Rehab PT Goals Patient Stated Goal: I want to go home today. the doctor said i could. PT Goal Formulation: With patient Time For Goal Achievement: 09/27/13 Potential to Achieve Goals: Good  Visit Information  Last PT Received On: 09/13/13 Assistance Needed: +2 (safety) History of Present Illness: admitted after a fall at home. Per pt, fell over her coffee table. Pt  has life alert and utilized it per pt. Pt has h/o falls, depression and bipolar.       Prior Functioning  Home Living Family/patient expects to be discharged to:: Private residence Living Arrangements: Alone Type of Home: Apartment Home Access: Level entry Home Layout: One level Home Equipment: Walker - 2 wheels;Bedside commode Additional Comments: pt reports that sisters assist with groceries, has a cleaning lady or man(pt said both),  Communication  Communication: Expressive difficulties     Cognition  Cognition Arousal/Alertness: Awake/alert Behavior During Therapy: WFL for tasks assessed/performed Overall Cognitive Status: Difficult to assess Area of Impairment: Orientation;Following commands General Comments: pt had difficulty following directions during strength test. pt sort of spced out at times.     Extremity/Trunk Assessment Upper Extremity Assessment Upper Extremity Assessment: Generalized weakness Lower Extremity Assessment Lower Extremity Assessment: LLE deficits/detail LLE Deficits / Details: hip flexion 3+, knee ext. 4, knee flexion 3+, eversion 3+, dorsiflexion 4,  Cervical / Trunk Assessment Cervical / Trunk Assessment: Other exceptions Cervical / Trunk Exceptions: pt's L eye closes and does not open at times. Pt reports that this happens at home.  Pt appreciates light touch on RLE   L face appears droopy. L eye lid does not open everytime with R.  Balance Balance Overall balance assessment: Needs assistance;History of Falls Sitting-balance support: Feet supported;No upper extremity supported Sitting balance-Leahy Scale: Fair Sitting balance - Comments: able to lean forward while sitting to reach R foot. Unable to lean over to put L foot into panty. Standing balance support: During functional activity;Bilateral upper extremity supported Standing balance-Leahy Scale: Poor Standing balance comment: pt loses balance standing and attempting to pull up panties. loses balance with RW when R leg does not swing to.  End of Session PT - End of Session Equipment Utilized During Treatment: Gait belt Activity Tolerance: Patient limited by fatigue Patient left: in chair;with call bell/phone within reach;with chair alarm set Nurse Communication: Mobility status  GP Functional Assessment Tool Used: clinical reasoning. Functional Limitation: Mobility: Walking and moving around Mobility: Walking and Moving Around Current Status [phone]): At least 40 percent but less  than 60 percent impaired, limited or restricted Mobility: Walking and Moving Around Goal Status [phone]): At least 1 percent but less than 20 percent impaired, limited or restricted   Rada Hay 09/13/2013, 10:12 AM Blanchard Kelch PT [phone]

## 2013-09-13 NOTE — Progress Notes (Addendum)
Patient to be DC to home.  IV Dc.  Home med rec carefully reviewed.  Rx given. Patient to transport to home. SW [place].  CM arranged HH.  No changes noted since am assessment.  Patient reported that her sisters were out of town, but that she did have a son in law that could fill her Rx for her tonight.  Patient denied for RN to call her sister, Pam Jackson, to let her know that she was being DC home.

## 2013-09-13 NOTE — Discharge Summary (Addendum)
Pam Jackson, is a 73 y.o. female  DOB May 21, 1941  MRN [id number].  Admission date:  09/12/2013  Admitting Physician  Edsel Petrin, DO  Discharge Date:  09/13/2013   Primary MD  Sissy Hoff, MD  Recommendations for primary care physician for things to follow:   Follow final urine cultures, repeat CBC BMP in a week.  Monitor narcotic use   Admission Diagnosis  Back pain [724.5] UTI (urinary tract infection) [599.0] Altered mental status [780.97] Fall [E888.9]   Discharge Diagnosis  Back pain [724.5] UTI (urinary tract infection) [599.0] Altered mental status [780.97] Fall [E888.9]     Active Problems:   Altered mental status   Acute encephalopathy      Past Medical History  Diagnosis Date  . Thyroid disease   . Bipolar 1 disorder   . Depression   . GERD (gastroesophageal reflux disease)     Past Surgical History  Procedure Laterality Date  . Abdominal hysterectomy    . Hammer toe surgery       Discharge Condition: stable   Follow UP  Follow-up Information   Follow up with [facility], MD. Schedule an appointment as soon as possible for a visit in 1 week.   Specialty:  Family Medicine   Contact information:   [address], [address] [place] [address] [phone]       Follow up with Tia Alert, MD. Schedule an appointment as soon as possible for a visit in 1 week.   Specialty:  Neurosurgery   Contact information:   [address] [place] [address] [phone]         Discharge Instructions  and  Discharge Medications         Medication List         aspirin EC 81 MG tablet  Take 81 mg by mouth daily.     cefUROXime 500 MG tablet  Commonly known as:  CEFTIN  Take 1 tablet (500 mg total) by mouth 2 (two) times daily with a meal.     cholecalciferol 1000 UNITS tablet  Commonly known as:  VITAMIN D  Take 1,000 Units by mouth daily.     divalproex 500 MG DR tablet  Commonly known as:  DEPAKOTE  Take 1,000 mg by mouth at bedtime.     FLUoxetine 20 MG tablet  Commonly known as:  PROZAC  Take 20 mg by mouth every Monday, Wednesday, and Friday.     gabapentin 300 MG capsule  Commonly known as:  NEURONTIN  Take 300 mg by mouth 3 (three) times daily.     lamoTRIgine 150 MG tablet  Commonly known as:  LAMICTAL  Take 150 mg by mouth daily.     LEVOTHROID 25 MCG tablet  Generic drug:  levothyroxine  Take 25 mcg by mouth daily before breakfast.     meloxicam 15 MG tablet  Commonly known as:  MOBIC  Take 15 mg by mouth daily.     oxyCODONE-acetaminophen 5-325 MG per tablet  Commonly known as:  PERCOCET/ROXICET  Take 1 tablet by mouth every 6 (six) hours as needed for severe pain.     pantoprazole 40 MG tablet  Commonly known as:  PROTONIX  Take 40 mg by mouth daily.          Diet and Activity recommendation: See Discharge Instructions above   Consults obtained -     Major procedures and Radiology Reports - PLEASE review detailed and final reports for all details, in brief -       Dg Lumbar Spine Complete  09/12/2013   CLINICAL DATA:  Fall with low back pain  EXAM: LUMBAR SPINE - COMPLETE 4+ VIEW  COMPARISON:  Sagittal images of the lumbar spine from a CT scan of the abdomen and pelvis dated October 25, 2012.  FINDINGS: The lumbar vertebral bodies are preserved in height. Degenerative disc space narrowing is present at L1-2 and at L2-3 similar to that previously demonstrated. There is degenerative facet joint change at L4-5 and L5-S1. There is no pars defect nor spondylolisthesis. The pedicles and transverse processes appear intact. There is very gentle curvature of the mid lumbar spine with the convexity toward the left which is stable. The observed portions of the sacrum are grossly intact.  IMPRESSION: 1.  There is no acute lumbar spine fracture nor dislocation. 2. There are stable degenerative changes as described.   Electronically Signed   By: David  [place]   On: 09/12/2013 16:10   Ct Head Wo Contrast  09/12/2013   CLINICAL DATA:  Status post fall.  No loss of consciousness.  EXAM: CT HEAD WITHOUT CONTRAST  TECHNIQUE: Contiguous axial images were obtained from the base of the skull through the vertex without intravenous contrast.  COMPARISON:  CT HEAD W/O CM dated 07/13/2012  FINDINGS: There is no evidence of mass effect, midline shift, or extra-axial fluid collections. There is no evidence of a space-occupying lesion or intracranial hemorrhage. There is no evidence of a cortical-based area of acute infarction. There is generalized cerebral atrophy. There is periventricular white matter low attenuation likely secondary to microangiopathy.  The ventricles and sulci are appropriate for the patient's age. The basal cisterns are patent.  Visualized portions of the orbits are unremarkable. The visualized portions of the paranasal sinuses and mastoid air cells are unremarkable.  The osseous structures are unremarkable.  IMPRESSION: No acute intracranial pathology.   Electronically Signed   By: Elige KoHetal  Patel   On: 09/12/2013 16:09    Micro Results      No results found for this or any previous visit (from the past 240 hour(s)).   History of present illness and  Hospital Course:     Kindly see H&P for history of present illness and admission details, please review complete Labs, Consult reports and Test reports for all details in brief Pam Jackson, is a 73 y.o. female, patient with history of bipolar disorder, GERD, hypothyroidism, chronic back pain on narcotics and muscle relaxants at home who came to the ER for evaluation of back pain along with trip and fall and after initial workup is being discharged home, however prior to her discharge patient was noted to be slightly confused, head CT was done which  was unremarkable along with lumbar spine x-rays, her workup was consistent with delirium with UTI and she was admitted to the hospital for the same.     Patient was kept on IV fluids along with IV Rocephin with good results, this morning she is back to  her baseline, denies any headache chest or abdominal pain, chronic low back pain which is much better, she does not want to stay in the hospital and eager to go home, I will place her on 5 more days of oral Ceftin and discharged home, we'll request PCP to monitor final urine culture results and to kindly repeat CBC BMP in a week. Her potassium was slightly low today which is being replaced orally.    For her chronic medical problems which include bipolar disorder, chronic back pain, hypothyroidism, GERD she will continue her home medications unchanged. I will order home PT and RN along with a walker . She refused SNF.       Today   Subjective:   Elora Wolter today has no headache,no chest abdominal pain,no new weakness tingling or numbness, feels much better wants to go home today.    Objective:   Blood pressure 128/68, pulse 78, temperature 97.9 F (36.6 C), temperature source Oral, resp. rate 20, height 5\' 6"  (1.676 m), weight 87.6 kg (193 lb 2 oz), SpO2 96.00%.   Intake/Output Summary (Last 24 hours) at 09/13/13 0902 Last data filed at 09/13/13 0400  Gross per 24 hour  Intake 338.75 ml  Output      0 ml  Net 338.75 ml    Exam Awake Alert, Oriented *3, No new F.N deficits, Normal affect [place].AT,PERRAL Supple Neck,No JVD, No cervical lymphadenopathy appriciated.  Symmetrical Chest wall movement, Good air movement bilaterally, CTAB RRR,No Gallops,Rubs or new Murmurs, No Parasternal Heave +ve B.Sounds, Abd Soft, Non tender, No organomegaly appriciated, No rebound -guarding or rigidity. No Cyanosis, Clubbing or edema, No new Rash or bruise  Data Review   CBC w Diff: Lab Results  Component Value Date   WBC 9.0 09/13/2013     HGB 12.9 09/13/2013   HCT 37.9 09/13/2013   PLT 232 09/13/2013   LYMPHOPCT 22 09/12/2013   MONOPCT 9 09/12/2013   EOSPCT 2 09/12/2013   BASOPCT 0 09/12/2013    CMP: Lab Results  Component Value Date   NA 143 09/13/2013   K 3.6* 09/13/2013   CL 104 09/13/2013   CO2 24 09/13/2013   BUN 13 09/13/2013   CREATININE 0.67 09/13/2013   PROT 7.0 09/12/2013   ALBUMIN 3.9 09/12/2013   BILITOT 0.3 09/12/2013   ALKPHOS 66 09/12/2013   AST 22 09/12/2013   ALT 12 09/12/2013  .   Total Time in preparing paper work, data evaluation and todays exam - 35 minutes  Leroy Sea M.D on 09/13/2013 at 9:02 AM  [facility] Office  [phone]

## 2013-09-13 NOTE — Progress Notes (Addendum)
Clinical Social Work  Charity fundraiserN reports ready for patient to DC. Patient verified address and has keys to get into home. CSW [place]. Request #: [id number]. CSW called and left a message with [facility] representative for authorization. CSW will alert [facility] once authorization # is received.  CSW is signing off but available if needed.  DillwynHolly Neta [place], [place] [phone]  Addendum: [phone]  [facility] and reported patient has PPO policy and no prior approval needed for ambulance transfer.

## 2013-09-13 NOTE — Discharge Instructions (Signed)
Follow with Primary MD Sissy HoffSWAYNE,DAVID W, MD in 7 days   Get CBC, CMP, checked 7 days by Primary MD and again as instructed by your Primary MD.     Activity: As tolerated with Full fall precautions use walker/cane & assistance as needed   Disposition Home     Diet: Heart Healthy  For Heart failure patients - Check your Weight same time everyday, if you gain over 2 pounds, or you develop in leg swelling, experience more shortness of breath or chest pain, call your Primary MD immediately. Follow Cardiac Low Salt Diet and 1.8 lit/day fluid restriction.   On your next visit with her primary care physician please Get Medicines reviewed and adjusted.  Please request your Prim.MD to go over all Hospital Tests and Procedure/Radiological results at the follow up, please get all Hospital records sent to your Prim MD by signing hospital release before you go home.   If you experience worsening of your admission symptoms, develop shortness of breath, life threatening emergency, suicidal or homicidal thoughts you must seek medical attention immediately by calling 911 or calling your MD immediately  if symptoms less severe.  You Must read complete instructions/literature along with all the possible adverse reactions/side effects for all the Medicines you take and that have been prescribed to you. Take any new Medicines after you have completely understood and accpet all the possible adverse reactions/side effects.   Do not drive and provide baby sitting services if your were admitted for syncope or siezures until you have seen by Primary MD or a Neurologist and advised to do so again.  Do not drive when taking Pain medications.    Do not take more than prescribed Pain, Sleep and Anxiety Medications  Special Instructions: If you have smoked or chewed Tobacco  in the last 2 yrs please stop smoking, stop any regular Alcohol  and or any Recreational drug use.  Wear Seat belts while driving.   Please  note  You were cared for by a hospitalist during your hospital stay. If you have any questions about your discharge medications or the care you received while you were in the hospital after you are discharged, you can call the unit and asked to speak with the hospitalist on call if the hospitalist that took care of you is not available. Once you are discharged, your primary care physician will handle any further medical issues. Please note that NO REFILLS for any discharge medications will be authorized once you are discharged, as it is imperative that you return to your primary care physician (or establish a relationship with a primary care physician if you do not have one) for your aftercare needs so that they can reassess your need for medications and monitor your lab values.

## 2013-09-14 LAB — URINE CULTURE

## 2013-09-28 ENCOUNTER — Encounter (HOSPITAL_COMMUNITY): Payer: Self-pay | Admitting: Emergency Medicine

## 2013-09-28 ENCOUNTER — Emergency Department (HOSPITAL_COMMUNITY)
Admission: EM | Admit: 2013-09-28 | Discharge: 2013-09-28 | Disposition: A | Discharge status: Home / Self Care | Payer: Medicare Other | Attending: Emergency Medicine | Admitting: Emergency Medicine

## 2013-09-28 DIAGNOSIS — K219 Gastro-esophageal reflux disease without esophagitis: Secondary | ICD-10-CM | POA: Insufficient documentation

## 2013-09-28 DIAGNOSIS — Z792 Long term (current) use of antibiotics: Secondary | ICD-10-CM | POA: Diagnosis not present

## 2013-09-28 DIAGNOSIS — L259 Unspecified contact dermatitis, unspecified cause: Secondary | ICD-10-CM | POA: Insufficient documentation

## 2013-09-28 DIAGNOSIS — F319 Bipolar disorder, unspecified: Secondary | ICD-10-CM | POA: Insufficient documentation

## 2013-09-28 DIAGNOSIS — L309 Dermatitis, unspecified: Secondary | ICD-10-CM

## 2013-09-28 DIAGNOSIS — R350 Frequency of micturition: Secondary | ICD-10-CM | POA: Diagnosis present

## 2013-09-28 DIAGNOSIS — N390 Urinary tract infection, site not specified: Secondary | ICD-10-CM | POA: Insufficient documentation

## 2013-09-28 DIAGNOSIS — E079 Disorder of thyroid, unspecified: Secondary | ICD-10-CM | POA: Diagnosis not present

## 2013-09-28 DIAGNOSIS — Z79899 Other long term (current) drug therapy: Secondary | ICD-10-CM | POA: Diagnosis not present

## 2013-09-28 DIAGNOSIS — Z791 Long term (current) use of non-steroidal anti-inflammatories (NSAID): Secondary | ICD-10-CM | POA: Diagnosis not present

## 2013-09-28 DIAGNOSIS — Z7982 Long term (current) use of aspirin: Secondary | ICD-10-CM | POA: Insufficient documentation

## 2013-09-28 LAB — URINALYSIS, ROUTINE W REFLEX MICROSCOPIC
BILIRUBIN URINE: NEGATIVE
Glucose, UA: NEGATIVE mg/dL
HGB URINE DIPSTICK: NEGATIVE
Ketones, ur: NEGATIVE mg/dL
NITRITE: NEGATIVE
PH: 6 (ref 5.0–8.0)
Protein, ur: NEGATIVE mg/dL
Specific Gravity, Urine: 1.031 — ABNORMAL HIGH (ref 1.005–1.030)
Urobilinogen, UA: 1 mg/dL (ref 0.0–1.0)

## 2013-09-28 LAB — URINE MICROSCOPIC-ADD ON

## 2013-09-28 MED ORDER — PHENAZOPYRIDINE HCL 200 MG PO TABS: 200.0000 mg | ORAL_TABLET | Freq: Three times a day (TID) | ORAL | Status: DC

## 2013-09-28 MED ORDER — HYDROCODONE-ACETAMINOPHEN 5-325 MG PO TABS
1.0000 | ORAL_TABLET | Freq: Once | ORAL | Status: AC
  Administered 2013-09-28: 1 via ORAL
  Filled 2013-09-28: qty 1

## 2013-09-28 MED ORDER — CEFTRIAXONE SODIUM 1 G IJ SOLR
1.0000 g | Freq: Once | INTRAMUSCULAR | Status: AC
  Administered 2013-09-28: 1 g via INTRAMUSCULAR
  Filled 2013-09-28: qty 10

## 2013-09-28 MED ORDER — PHENAZOPYRIDINE HCL 200 MG PO TABS
200.0000 mg | ORAL_TABLET | Freq: Once | ORAL | Status: AC
  Administered 2013-09-28: 200 mg via ORAL
  Filled 2013-09-28: qty 1

## 2013-09-28 MED ORDER — CEPHALEXIN 500 MG PO CAPS: 500.0000 mg | ORAL_CAPSULE | Freq: Four times a day (QID) | ORAL | Status: DC

## 2013-09-28 MED ORDER — LIDOCAINE HCL 1 % IJ SOLN
INTRAMUSCULAR | Status: AC
  Administered 2013-09-28: 2.1 mL
  Filled 2013-09-28: qty 20

## 2013-09-28 NOTE — Discharge Instructions (Signed)
Urinary Tract Infection  Urinary tract infections (UTIs) can develop anywhere along your urinary tract. Your urinary tract is your body's drainage system for removing wastes and extra water. Your urinary tract includes two kidneys, two ureters, a bladder, and a urethra. Your kidneys are a pair of bean-shaped organs. Each kidney is about the size of your fist. They are located below your ribs, one on each side of your spine.  CAUSES  Infections are caused by microbes, which are microscopic organisms, including fungi, viruses, and bacteria. These organisms are so small that they can only be seen through a microscope. Bacteria are the microbes that most commonly cause UTIs.  SYMPTOMS   Symptoms of UTIs may vary by age and gender of the patient and by the location of the infection. Symptoms in young women typically include a frequent and intense urge to urinate and a painful, burning feeling in the bladder or urethra during urination. Older women and men are more likely to be tired, shaky, and weak and have muscle aches and abdominal pain. A fever may mean the infection is in your kidneys. Other symptoms of a kidney infection include pain in your back or sides below the ribs, nausea, and vomiting.  DIAGNOSIS  To diagnose a UTI, your caregiver will ask you about your symptoms. Your caregiver also will ask to provide a urine sample. The urine sample will be tested for bacteria and white blood cells. White blood cells are made by your body to help fight infection.  TREATMENT   Typically, UTIs can be treated with medication. Because most UTIs are caused by a bacterial infection, they usually can be treated with the use of antibiotics. The choice of antibiotic and length of treatment depend on your symptoms and the type of bacteria causing your infection.  HOME CARE INSTRUCTIONS   If you were prescribed antibiotics, take them exactly as your caregiver instructs you. Finish the medication even if you feel better after you  have only taken some of the medication.   Drink enough water and fluids to keep your urine clear or pale yellow.   Avoid caffeine, tea, and carbonated beverages. They tend to irritate your bladder.   Empty your bladder often. Avoid holding urine for long periods of time.   Empty your bladder before and after sexual intercourse.   After a bowel movement, women should cleanse from front to back. Use each tissue only once.  SEEK MEDICAL CARE IF:    You have back pain.   You develop a fever.   Your symptoms do not begin to resolve within 3 days.  SEEK IMMEDIATE MEDICAL CARE IF:    You have severe back pain or lower abdominal pain.   You develop chills.   You have nausea or vomiting.   You have continued burning or discomfort with urination.  MAKE SURE YOU:    Understand these instructions.   Will watch your condition.   Will get help right away if you are not doing well or get worse.  Document Released: 03/30/2005 Document Revised: 12/20/2011 Document Reviewed: 07/29/2011  [employer] Patient Information 2014 [employer].

## 2013-09-28 NOTE — ED Notes (Signed)
Pt ended up riding home with sister after taxi was called , due to fact her sister pulled up as I was putting pt in cab

## 2013-09-28 NOTE — ED Notes (Signed)
Bed: [id number] Expected date:  Expected time:  Means of arrival:  Comments: EMS 72yo F, UTI sx

## 2013-09-28 NOTE — ED Provider Notes (Signed)
CSN: [id number]     Arrival date & time 09/28/13  0146 History   First MD Initiated Contact with Patient 09/28/13 [phone]     Chief Complaint  Patient presents with  . Urinary Frequency      HPI Patient reports first in 1 week of dysuria with some urinary frequency.  She denies fevers or chills.  No nausea or vomiting.  She reports she has a lot of urinary incontinence and has been wearing an adult diaper.  She states she doesn't changes as frequently as she should.  She reports that she's developed some irritation and pain in her vaginal area.  She's been trying a barrier cream without improvement in her symptoms.  She has a health aide that assists her at the house and reportedly looked down there was concern that look like a diaper rash.  She denies abdominal pain or flank pain.  Symptoms are mild to moderate in severity    Past Medical History  Diagnosis Date  . Thyroid disease   . Bipolar 1 disorder   . Depression   . GERD (gastroesophageal reflux disease)    Past Surgical History  Procedure Laterality Date  . Abdominal hysterectomy    . Hammer toe surgery     No family history on file. History  Substance Use Topics  . Smoking status: Never Smoker   . Smokeless tobacco: Never Used  . Alcohol Use: No   OB History   Grav Para Term Preterm Abortions TAB SAB Ect Mult Living                 Review of Systems  All other systems reviewed and are negative.      Allergies  Review of patient's allergies indicates no known allergies.  Home Medications   Current Outpatient Rx  Name  Route  Sig  Dispense  Refill  . aspirin EC 81 MG tablet   Oral   Take 81 mg by mouth daily.         . cholecalciferol (VITAMIN D) 1000 UNITS tablet   Oral   Take 1,000 Units by mouth daily.         . divalproex (DEPAKOTE) 500 MG DR tablet   Oral   Take 1,000 mg by mouth at bedtime.         Marland Kitchen FLUoxetine (PROZAC) 20 MG tablet   Oral   Take 20 mg by mouth every Monday, Wednesday,  and Friday.          . lamoTRIgine (LAMICTAL) 150 MG tablet   Oral   Take 150 mg by mouth daily.         Marland Kitchen levothyroxine (LEVOTHROID) 25 MCG tablet   Oral   Take 25 mcg by mouth daily before breakfast.         . meloxicam (MOBIC) 15 MG tablet   Oral   Take 15 mg by mouth daily.         . pantoprazole (PROTONIX) 40 MG tablet   Oral   Take 40 mg by mouth daily.         . traMADol (ULTRAM) 50 MG tablet   Oral   Take 1 tablet by mouth 2 (two) times daily as needed for moderate pain.          . cefUROXime (CEFTIN) 500 MG tablet   Oral   Take 1 tablet (500 mg total) by mouth 2 (two) times daily with a meal.   10 tablet   0   .  cephALEXin (KEFLEX) 500 MG capsule   Oral   Take 1 capsule (500 mg total) by mouth 4 (four) times daily.   28 capsule   0   . phenazopyridine (PYRIDIUM) 200 MG tablet   Oral   Take 1 tablet (200 mg total) by mouth 3 (three) times daily.   6 tablet   0    BP 106/53  Pulse 80  Temp(Src) 98.1 F (36.7 C) (Oral)  Resp 18  SpO2 96% Physical Exam  Nursing note and vitals reviewed. Constitutional: She is oriented to person, place, and time. She appears well-developed and well-nourished. No distress.  HENT:  Head: Normocephalic and atraumatic.  Eyes: EOM are normal.  Neck: Normal range of motion.  Cardiovascular: Normal rate, regular rhythm and normal heart sounds.   Pulmonary/Chest: Effort normal and breath sounds normal.  Abdominal: Soft. She exhibits no distension. There is no tenderness.  Genitourinary:  Dermatitis of her bilateral vulvar regions.  No significant swelling.  No abscess or cellulitis present  Musculoskeletal: Normal range of motion.  Neurological: She is alert and oriented to person, place, and time.  Skin: Skin is warm and dry.  Psychiatric: She has a normal mood and affect. Judgment normal.    ED Course  Procedures (including critical care time) Labs Review Labs Reviewed  URINALYSIS, ROUTINE W REFLEX  MICROSCOPIC - Abnormal; Notable for the following:    Specific Gravity, Urine 1.031 (*)    Leukocytes, UA MODERATE (*)    All other components within normal limits  URINE CULTURE  URINE MICROSCOPIC-ADD ON   Imaging Review No results found.   EKG Interpretation None      MDM   Final diagnoses:  Urinary tract infection  Vulvar dermatitis    Vulvar dermatitis likely secondary to ongoing moisture.  Recommend keeping her self as dry as possible and using some sort of barrier cream.  Patient be treated for urinary tract infection.  Home with Pyridium.  Vitals stable    Lyanne CoKevin M Jenia Klepper, MD 09/28/13 [phone]

## 2013-09-28 NOTE — ED Notes (Signed)
Pt states she has urinary frequency, and she thinks she has a uti,  Pain in vaginal area and redness

## 2013-09-30 LAB — URINE CULTURE: Colony Count: 35000

## 2013-10-03 ENCOUNTER — Encounter (HOSPITAL_COMMUNITY): Payer: Self-pay | Admitting: Emergency Medicine

## 2013-10-03 ENCOUNTER — Emergency Department (HOSPITAL_COMMUNITY)
Admission: EM | Admit: 2013-10-03 | Discharge: 2013-10-03 | Disposition: A | Discharge status: Home / Self Care | Payer: Medicare Other | Attending: Emergency Medicine | Admitting: Emergency Medicine

## 2013-10-03 DIAGNOSIS — R21 Rash and other nonspecific skin eruption: Secondary | ICD-10-CM | POA: Insufficient documentation

## 2013-10-03 DIAGNOSIS — Z79899 Other long term (current) drug therapy: Secondary | ICD-10-CM | POA: Insufficient documentation

## 2013-10-03 DIAGNOSIS — F329 Major depressive disorder, single episode, unspecified: Secondary | ICD-10-CM | POA: Insufficient documentation

## 2013-10-03 DIAGNOSIS — E079 Disorder of thyroid, unspecified: Secondary | ICD-10-CM | POA: Insufficient documentation

## 2013-10-03 DIAGNOSIS — N760 Acute vaginitis: Secondary | ICD-10-CM | POA: Insufficient documentation

## 2013-10-03 DIAGNOSIS — F3289 Other specified depressive episodes: Secondary | ICD-10-CM | POA: Insufficient documentation

## 2013-10-03 DIAGNOSIS — K219 Gastro-esophageal reflux disease without esophagitis: Secondary | ICD-10-CM | POA: Insufficient documentation

## 2013-10-03 DIAGNOSIS — F319 Bipolar disorder, unspecified: Secondary | ICD-10-CM | POA: Insufficient documentation

## 2013-10-03 DIAGNOSIS — Z7982 Long term (current) use of aspirin: Secondary | ICD-10-CM | POA: Insufficient documentation

## 2013-10-03 LAB — URINALYSIS, ROUTINE W REFLEX MICROSCOPIC
BILIRUBIN URINE: NEGATIVE
Glucose, UA: NEGATIVE mg/dL
HGB URINE DIPSTICK: NEGATIVE
KETONES UR: NEGATIVE mg/dL
Leukocytes, UA: NEGATIVE
Nitrite: NEGATIVE
Protein, ur: NEGATIVE mg/dL
Specific Gravity, Urine: 1.035 — ABNORMAL HIGH (ref 1.005–1.030)
UROBILINOGEN UA: 1 mg/dL (ref 0.0–1.0)
pH: 6 (ref 5.0–8.0)

## 2013-10-03 MED ORDER — NYSTATIN 100000 UNIT/GM EX CREA: TOPICAL_CREAM | CUTANEOUS | Status: DC

## 2013-10-03 MED ORDER — NYSTATIN 100000 UNIT/GM EX POWD
Freq: Once | CUTANEOUS | Status: AC
  Administered 2013-10-03: 06:00:00 via TOPICAL
  Filled 2013-10-03: qty 15

## 2013-10-03 NOTE — Discharge Instructions (Signed)
Disorders of the Vulva It is important to know the outside (external) area of the female genitalia to properly examine yourself. The vulva or labia, sometimes also called the lips around the vagina, covers the pubic bone and surrounds the vagina. The larger or outer labia is called the labia majora. The inner or smaller labia is called the labia minora. The clitoris is at the top of the vulva. It is covered by a small area of tissue called the hood. Below the clitoris is the opening of the tube from the bladder, which is the urethral opening. Just below the urethral opening is the opening of the vagina. This area is called the vestibule. Inside the vestibule are bartholin and skene glands that lubricate the outside of the vagina during sexual intercourse. The perineum is the area that lies between the vagina and anus. You should examine your external genitalia once a month just as you do your self breast examination. SIGNS AND SYMPTOMS TO LOOK FOR DURING YOUR EXAMINATION:  Swelling.  Redness.  Bumps.  Blisters.  Black or white areas.  Itching.  Bleeding.  Burning. TYPES OF VULVAR PROBLEMS  Infections.  Yeast (fungus) is the most common infection that causes redness, swelling, itching and a thick white vaginal discharge. Women with diabetes, taking medicines that kill germs (antibiotics) or on birth control pills are at risk for yeast infection. This infection is treated with vaginal creams, suppositories and anti-itch cream for the vulva.  Genital warts (condyloma) are caused by the human papilloma virus. They are raised bumps that can itch, bleed, cause discomfort and sometimes appear in groups like cauliflower. This is a sexually transmitted disease (STD) caused by sexual contact. This can be treated with topical medication, freezing, electrocautery, laser or surgical removal.  Genital herpes is a virus that causes redness, swelling, blisters and ulcers that can cause itching and are  very painful. This is also a STD. There are medications to control the symptoms of herpes, but there is no cure. Once you have it, you keep getting it because the virus stays in your body.  Contact dermatitis. Contact dermatitis is an irritation of the vulva that can cause redness, swelling and itching. It can be caused by:  Perfumed toilet paper.  Deodorants.  Talcum powder.  Hygiene sprays.  Tampons.  Soaps and fabric softeners.  Wearing wet underwear and bathing suits too long.  Spermicide.  Condoms.  Diaphragms.  Poison ivy. Treatment will depend on eliminating the cause and treating the symptoms.  Vulvodynia.  Vulvodynia is "painful vulva." It includes burning, itching, irritation and a feeling of rawness or bruising of the vulva. Treating this problem depends on the cause and diagnosis. Treatment can take a long time.  Vulvar Dystrophy.  Vulvar Dystrophy is a disorder of the skin of the vulva. The skin can be too thick with hard raised patches or too thin skin showing wrinkles. Vulvar dystrophy can cause redness or white patches, itching and burning sensation. A biopsy may be needed to diagnose the problem. Treatment with creams or ointments depends on the diagnosis.  Vulvar Cancer.  Vulvar cancer is usually a form of squamus skin cancer. Other types of vulvar cancer like melenoma (a dark or black, irregular shaped mole that can bleed easily) and adenocarcinoma (red, itchy, scaly area that looks like eczema) are rare. Treatment is usually surgery to remove the cancerous area, the vulva and the lymph nodes in the groin. This can be done with or without radiation and chemotherapy. HOME   CARE INSTRUCTIONS   Look at your vulva and external genital area every month.  Follow and finish all treatment given to you by your caregiver.  Do not use perfumed or scented soaps, detergents, hygiene spray, talcum powder, tampons, douches or toilet paper.  Remove your tampon every 6  hours. Do not leave tampons in overnight.  Wear loose-fitting clothing.  Wear underwear that is cotton.  Avoid spermicidal creams or foams that irritate you. SEEK MEDICAL CARE IF:   You notice changes on your vulva such as redness, swelling, itching, color changes, bleeding, small bumps, blisters or any discomfort.  You develop an abnormal vaginal discharge.  You have painful sexual intercourse.  You notice swelling of the lymph nodes in your groin. Document Released: 12/07/2007 Document Revised: 09/12/2011 Document Reviewed: 09/20/2010 [employer] Patient Information 2014 [employer].  

## 2013-10-03 NOTE — ED Notes (Signed)
Per EMS report: pt c/o burning upon urination.  Pt dx a week ago for a UTI and symptoms have not improved despite antibiotic tx. Pt a/o x 4.

## 2013-10-03 NOTE — ED Provider Notes (Signed)
CSN: [id number]     Arrival date & time 10/03/13  0400 History   First MD Initiated Contact with Patient 10/03/13 [phone]     Chief Complaint  Patient presents with  . Urinary Tract Infection     (Consider location/radiation/quality/duration/timing/severity/associated sxs/prior Treatment) HPI History provided by patient. Lives at home with home health assistance, has been having "UTI symptoms" for the last week. She was evaluated here at that time and prescribed Pyridium and Keflex for UTI. She complains of persistent symptoms despite medications. She has burning when she urinates, vulvar rash and itchiness. No back pain. No flank pain. No fevers or chills. No nausea vomiting. Symptoms moderate in severity without known alleviating factors. No abdominal pain.  Past Medical History  Diagnosis Date  . Thyroid disease   . Bipolar 1 disorder   . Depression   . GERD (gastroesophageal reflux disease)    Past Surgical History  Procedure Laterality Date  . Abdominal hysterectomy    . Hammer toe surgery     History reviewed. No pertinent family history. History  Substance Use Topics  . Smoking status: Never Smoker   . Smokeless tobacco: Never Used  . Alcohol Use: No   OB History   Grav Para Term Preterm Abortions TAB SAB Ect Mult Living                 Review of Systems  Constitutional: Negative for fever and chills.  Respiratory: Negative for shortness of breath.   Cardiovascular: Negative for chest pain.  Gastrointestinal: Negative for abdominal pain.  Genitourinary: Positive for dysuria.  Musculoskeletal: Negative for back pain.  Skin: Positive for rash.  Neurological: Negative for headaches.  All other systems reviewed and are negative.      Allergies  Review of patient's allergies indicates no known allergies.  Home Medications   Current Outpatient Rx  Name  Route  Sig  Dispense  Refill  . aspirin EC 81 MG tablet   Oral   Take 81 mg by mouth daily.         Marland Kitchen.  CALCIUM-VITAMIN D PO   Oral   Take 1 tablet by mouth daily.         . cephALEXin (KEFLEX) 500 MG capsule   Oral   Take 1 capsule (500 mg total) by mouth 4 (four) times daily.   28 capsule   0   . cholecalciferol (VITAMIN D) 1000 UNITS tablet   Oral   Take 1,000 Units by mouth daily.         . divalproex (DEPAKOTE) 500 MG DR tablet   Oral   Take 1,000 mg by mouth at bedtime.         Marland Kitchen. FLUoxetine (PROZAC) 20 MG tablet   Oral   Take 20 mg by mouth every Monday, Wednesday, and Friday.          Marland Kitchen. ibuprofen (ADVIL,MOTRIN) 200 MG tablet   Oral   Take 600 mg by mouth every 6 (six) hours as needed for moderate pain.         Marland Kitchen. lamoTRIgine (LAMICTAL) 150 MG tablet   Oral   Take 150 mg by mouth daily.         Marland Kitchen. levothyroxine (LEVOTHROID) 25 MCG tablet   Oral   Take 25 mcg by mouth daily before breakfast.         . meloxicam (MOBIC) 15 MG tablet   Oral   Take 15 mg by mouth daily.         .Marland Kitchen  Omega-3 Fatty Acids (FISH OIL) 1000 MG CAPS   Oral   Take 1,000 mg by mouth daily.         . pantoprazole (PROTONIX) 40 MG tablet   Oral   Take 40 mg by mouth daily.         . traMADol (ULTRAM) 50 MG tablet   Oral   Take 1 tablet by mouth 2 (two) times daily as needed for moderate pain.          Marland Kitchen VITAMIN E PO   Oral   Take 1 capsule by mouth daily.          BP 125/60  Pulse 74  Temp(Src) 98 F (36.7 C) (Oral)  Resp 16  Ht 5\' 6"  (1.676 m)  SpO2 94% Physical Exam  Nursing note and vitals reviewed. Constitutional: She is oriented to person, place, and time. She appears well-developed and well-nourished.  HENT:  Head: Normocephalic and atraumatic.  Eyes: EOM are normal. Pupils are equal, round, and reactive to light.  Neck: Neck supple.  Cardiovascular: Normal rate, normal heart sounds and intact distal pulses.   Pulmonary/Chest: Effort normal and breath sounds normal. No respiratory distress.  Abdominal: Soft. Bowel sounds are normal. She exhibits no  distension. There is no tenderness.  Genitourinary:  Ext GU: Areas of excoriation and induration to vulva.   Musculoskeletal: Normal range of motion. She exhibits no edema.  Neurological: She is alert and oriented to person, place, and time.  Skin: Skin is warm and dry.    ED Course  Procedures (including critical care time) Labs Review Labs Reviewed  URINALYSIS, ROUTINE W REFLEX MICROSCOPIC - Abnormal; Notable for the following:    Specific Gravity, Urine 1.035 (*)    All other components within normal limits   Nystatin provided.  Plan discharge home with antifungal treatment and followup primary care physician. Patient agrees to return precautions, is stable and appropriate for discharge at this time.  MDM   Dx: Vulvovaginitis  UA reviewed as above - no UTI Medication provided VS and nurses notes reviewed and considered Prior ER visit reviewed in EMR    Sunnie Nielsen, MD 10/03/13 [phone]

## 2013-10-03 NOTE — ED Notes (Signed)
Bed: [id number] Expected date:  Expected time:  Means of arrival:  Comments: EMS 73yo F UTI sx

## 2013-10-16 ENCOUNTER — Emergency Department (HOSPITAL_COMMUNITY)
Admission: EM | Admit: 2013-10-16 | Discharge: 2013-10-16 | Disposition: A | Discharge status: Home / Self Care | Payer: Medicare Other | Attending: Emergency Medicine | Admitting: Emergency Medicine

## 2013-10-16 ENCOUNTER — Emergency Department (HOSPITAL_COMMUNITY): Payer: Medicare Other

## 2013-10-16 ENCOUNTER — Encounter (HOSPITAL_COMMUNITY): Payer: Self-pay | Admitting: Emergency Medicine

## 2013-10-16 DIAGNOSIS — Y929 Unspecified place or not applicable: Secondary | ICD-10-CM | POA: Insufficient documentation

## 2013-10-16 DIAGNOSIS — F319 Bipolar disorder, unspecified: Secondary | ICD-10-CM | POA: Insufficient documentation

## 2013-10-16 DIAGNOSIS — E039 Hypothyroidism, unspecified: Secondary | ICD-10-CM | POA: Insufficient documentation

## 2013-10-16 DIAGNOSIS — W19XXXA Unspecified fall, initial encounter: Secondary | ICD-10-CM

## 2013-10-16 DIAGNOSIS — Z7982 Long term (current) use of aspirin: Secondary | ICD-10-CM | POA: Insufficient documentation

## 2013-10-16 DIAGNOSIS — S79919A Unspecified injury of unspecified hip, initial encounter: Secondary | ICD-10-CM | POA: Insufficient documentation

## 2013-10-16 DIAGNOSIS — Y9389 Activity, other specified: Secondary | ICD-10-CM | POA: Insufficient documentation

## 2013-10-16 DIAGNOSIS — Z792 Long term (current) use of antibiotics: Secondary | ICD-10-CM | POA: Insufficient documentation

## 2013-10-16 DIAGNOSIS — G8929 Other chronic pain: Secondary | ICD-10-CM | POA: Insufficient documentation

## 2013-10-16 DIAGNOSIS — Z79899 Other long term (current) drug therapy: Secondary | ICD-10-CM | POA: Insufficient documentation

## 2013-10-16 DIAGNOSIS — W010XXA Fall on same level from slipping, tripping and stumbling without subsequent striking against object, initial encounter: Secondary | ICD-10-CM | POA: Insufficient documentation

## 2013-10-16 DIAGNOSIS — M79606 Pain in leg, unspecified: Secondary | ICD-10-CM

## 2013-10-16 DIAGNOSIS — Z791 Long term (current) use of non-steroidal anti-inflammatories (NSAID): Secondary | ICD-10-CM | POA: Insufficient documentation

## 2013-10-16 DIAGNOSIS — K219 Gastro-esophageal reflux disease without esophagitis: Secondary | ICD-10-CM | POA: Insufficient documentation

## 2013-10-16 DIAGNOSIS — IMO0002 Reserved for concepts with insufficient information to code with codable children: Secondary | ICD-10-CM | POA: Insufficient documentation

## 2013-10-16 DIAGNOSIS — S79929A Unspecified injury of unspecified thigh, initial encounter: Principal | ICD-10-CM

## 2013-10-16 MED ORDER — KETOROLAC TROMETHAMINE 60 MG/2ML IM SOLN
30.0000 mg | Freq: Once | INTRAMUSCULAR | Status: AC
  Administered 2013-10-16: 30 mg via INTRAMUSCULAR
  Filled 2013-10-16: qty 2

## 2013-10-16 MED ORDER — MORPHINE SULFATE 4 MG/ML IJ SOLN
4.0000 mg | Freq: Once | INTRAMUSCULAR | Status: AC
  Administered 2013-10-16: 4 mg via INTRAMUSCULAR
  Filled 2013-10-16: qty 1

## 2013-10-16 MED ORDER — OXYCODONE-ACETAMINOPHEN 5-325 MG PO TABS: 1.0000 | ORAL_TABLET | Freq: Four times a day (QID) | ORAL | Status: DC | PRN

## 2013-10-16 NOTE — ED Notes (Signed)
Brought in by EMS from home after her fall this morning.  Per EMS, pt reported that she was getting out of bed and trying to get to her walker when she tripped and fell--- pt denies dizziness prior and after to fall, denies loss of consciousness; pt c/o lower back pain, right calf pain on EMS' arrival.

## 2013-10-16 NOTE — Discharge Instructions (Signed)
Chronic Pain Chronic pain can be defined as pain that is off and on and lasts for 3 6 months or longer. Many things cause chronic pain, which can make it difficult to make a diagnosis. There are many treatment options available for chronic pain. However, finding a treatment that works well for you may require trying various approaches until the right one is found. Many people benefit from a combination of two or more types of treatment to control their pain. SYMPTOMS  Chronic pain can occur anywhere in the body and can range from mild to very severe. Some types of chronic pain include:  Headache.  Low back pain.  Cancer pain.  Arthritis pain.  Neurogenic pain. This is pain resulting from damage to nerves. People with chronic pain may also have other symptoms such as:  Depression.  Anger.  Insomnia.  Anxiety. DIAGNOSIS  Your health care provider will help diagnose your condition over time. In many cases, the initial focus will be on excluding possible conditions that could be causing the pain. Depending on your symptoms, your health care provider may order tests to diagnose your condition. Some of these tests may include:   Blood tests.   CT scan.   MRI.   X-rays.   Ultrasounds.   Nerve conduction studies.  You may need to see a specialist.  TREATMENT  Finding treatment that works well may take time. You may be referred to a pain specialist. He or she may prescribe medicine or therapies, such as:   Mindful meditation or yoga.  Shots (injections) of numbing or pain-relieving medicines into the spine or area of pain.  Local electrical stimulation.  Acupuncture.   Massage therapy.   Aroma, color, light, or sound therapy.   Biofeedback.   Working with a physical therapist to keep from getting stiff.   Regular, gentle exercise.   Cognitive or behavioral therapy.   Group support.  Sometimes, surgery may be recommended.  HOME CARE INSTRUCTIONS    Take all medicines as directed by your health care provider.   Lessen stress in your life by relaxing and doing things such as listening to calming music.   Exercise or be active as directed by your health care provider.   Eat a healthy diet and include things such as vegetables, fruits, fish, and lean meats in your diet.   Keep all follow-up appointments with your health care provider.   Attend a support group with others suffering from chronic pain. SEEK MEDICAL CARE IF:   Your pain gets worse.   You develop a new pain that was not there before.   You cannot tolerate medicines given to you by your health care provider.   You have new symptoms since your last visit with your health care provider.  SEEK IMMEDIATE MEDICAL CARE IF:   You feel weak.   You have decreased sensation or numbness.   You lose control of bowel or bladder function.   Your pain suddenly gets much worse.   You develop shaking.  You develop chills.  You develop confusion.  You develop chest pain.  You develop shortness of breath.  MAKE SURE YOU:  Understand these instructions.  Will watch your condition.  Will get help right away if you are not doing well or get worse. Document Released: 03/12/2002 Document Revised: 02/20/2013 Document Reviewed: 12/14/2012 [facility] Patient Information 2014 [place], [place]. Fall Prevention and Home Safety Falls cause injuries and can affect all age groups. It is possible to use preventive  measures to significantly decrease the likelihood of falls. There are many simple measures which can make your home safer and prevent falls. OUTDOORS  Repair cracks and edges of walkways and driveways.  Remove high doorway thresholds.  Trim shrubbery on the main path into your home.  Have good outside lighting.  Clear walkways of tools, rocks, debris, and clutter.  Check that handrails are not broken and are securely fastened. Both sides of steps  should have handrails.  Have leaves, snow, and ice cleared regularly.  Use sand or salt on walkways during winter months.  In the garage, clean up grease or oil spills. BATHROOM  Install night lights.  Install grab bars by the toilet and in the tub and shower.  Use non-skid mats or decals in the tub or shower.  Place a plastic non-slip stool in the shower to sit on, if needed.  Keep floors dry and clean up all water on the floor immediately.  Remove soap buildup in the tub or shower on a regular basis.  Secure bath mats with non-slip, double-sided rug tape.  Remove throw rugs and tripping hazards from the floors. BEDROOMS  Install night lights.  Make sure a bedside light is easy to reach.  Do not use oversized bedding.  Keep a telephone by your bedside.  Have a firm chair with side arms to use for getting dressed.  Remove throw rugs and tripping hazards from the floor. KITCHEN  Keep handles on pots and pans turned toward the center of the stove. Use back burners when possible.  Clean up spills quickly and allow time for drying.  Avoid walking on wet floors.  Avoid hot utensils and knives.  Position shelves so they are not too high or low.  Place commonly used objects within easy reach.  If necessary, use a sturdy step stool with a grab bar when reaching.  Keep electrical cables out of the way.  Do not use floor polish or wax that makes floors slippery. If you must use wax, use non-skid floor wax.  Remove throw rugs and tripping hazards from the floor. STAIRWAYS  Never leave objects on stairs.  Place handrails on both sides of stairways and use them. Fix any loose handrails. Make sure handrails on both sides of the stairways are as long as the stairs.  Check carpeting to make sure it is firmly attached along stairs. Make repairs to worn or loose carpet promptly.  Avoid placing throw rugs at the top or bottom of stairways, or properly secure the rug  with carpet tape to prevent slippage. Get rid of throw rugs, if possible.  Have an electrician put in a light switch at the top and bottom of the stairs. OTHER FALL PREVENTION TIPS  Wear low-heel or rubber-soled shoes that are supportive and fit well. Wear closed toe shoes.  When using a stepladder, make sure it is fully opened and both spreaders are firmly locked. Do not climb a closed stepladder.  Add color or contrast paint or tape to grab bars and handrails in your home. Place contrasting color strips on first and last steps.  Learn and use mobility aids as needed. Install an electrical emergency response system.  Turn on lights to avoid dark areas. Replace light bulbs that burn out immediately. Get light switches that glow.  Arrange furniture to create clear pathways. Keep furniture in the same place.  Firmly attach carpet with non-skid or double-sided tape.  Eliminate uneven floor surfaces.  Select a carpet pattern  that does not visually hide the edge of steps.  Be aware of all pets. OTHER HOME SAFETY TIPS  Set the water temperature for 120 F (48.8 C).  Keep emergency numbers on or near the telephone.  Keep smoke detectors on every level of the home and near sleeping areas. Document Released: 06/10/2002 Document Revised: 12/20/2011 Document Reviewed: 09/09/2011 [facility] Patient Information 2014 [place], [place].

## 2013-10-16 NOTE — ED Notes (Signed)
Bed: [id number] Expected date:  Expected time:  Means of arrival:  Comments: EMS fall, leg pain

## 2013-10-16 NOTE — ED Provider Notes (Signed)
TIME SEEN: 4:35 PM  CHIEF COMPLAINT: Fall, right leg pain  HPI: Patient is a 73 year old female with a history of hypothyroidism, bipolar disorder, depression who presents emergency department with right lower extremity pain after a fall. She reports that she was walking using her walker when she lost her balance and fell on her right side. She denied hitting her head. She is having right lower Sherman pain from her hip to her toes. She states this pain is chronic but is worse than normal. Daughter reports the patient has had pain in her right lower extremity and a "heaviness" for one year. She is seen a spine specialist and has had MRIs of her spine. She is scheduled for epidural injections soon. She denies any new back pain, numbness or focal weakness. No bowel or bladder incontinence. No headache. No chest pain, abdominal pain or shortness of breath. She denies any chest pain, shortness of breath, dizziness or palpitations that led to her fall. No recent cough, vomiting or diarrhea, bloody stools or melena. She states she is here because she wants her pain controlled.  ROS: See HPI Constitutional: no fever  Eyes: no drainage  ENT: no runny nose   Cardiovascular:  no chest pain  Resp: no SOB  GI: no vomiting GU: no dysuria Integumentary: no rash  Allergy: no hives  Musculoskeletal: no leg swelling  Neurological: no slurred speech ROS otherwise negative  PAST MEDICAL HISTORY/PAST SURGICAL HISTORY:  Past Medical History  Diagnosis Date  . Thyroid disease   . Bipolar 1 disorder   . Depression   . GERD (gastroesophageal reflux disease)     MEDICATIONS:  Prior to Admission medications   Medication Sig Start Date End Date Taking? Authorizing Provider  aspirin EC 81 MG tablet Take 81 mg by mouth daily.    Historical Provider, MD  CALCIUM-VITAMIN D PO Take 1 tablet by mouth daily.    Historical Provider, MD  cephALEXin (KEFLEX) 500 MG capsule Take 1 capsule (500 mg total) by mouth 4  (four) times daily. 09/28/13   Lyanne Co, MD  cholecalciferol (VITAMIN D) 1000 UNITS tablet Take 1,000 Units by mouth daily.    Historical Provider, MD  divalproex (DEPAKOTE) 500 MG DR tablet Take 1,000 mg by mouth at bedtime.    Historical Provider, MD  FLUoxetine (PROZAC) 20 MG tablet Take 20 mg by mouth every Monday, Wednesday, and Friday.     Historical Provider, MD  ibuprofen (ADVIL,MOTRIN) 200 MG tablet Take 600 mg by mouth every 6 (six) hours as needed for moderate pain.    Historical Provider, MD  lamoTRIgine (LAMICTAL) 150 MG tablet Take 150 mg by mouth daily.    Historical Provider, MD  levothyroxine (LEVOTHROID) 25 MCG tablet Take 25 mcg by mouth daily before breakfast.    Historical Provider, MD  meloxicam (MOBIC) 15 MG tablet Take 15 mg by mouth daily.    Historical Provider, MD  nystatin cream (MYCOSTATIN) Apply to affected area 2 times daily 10/03/13   Sunnie Nielsen, MD  Omega-3 Fatty Acids (FISH OIL) 1000 MG CAPS Take 1,000 mg by mouth daily.    Historical Provider, MD  pantoprazole (PROTONIX) 40 MG tablet Take 40 mg by mouth daily.    Historical Provider, MD  traMADol (ULTRAM) 50 MG tablet Take 1 tablet by mouth 2 (two) times daily as needed for moderate pain.  08/28/13   Historical Provider, MD  VITAMIN E PO Take 1 capsule by mouth daily.    Historical Provider, MD  ALLERGIES:  No Known Allergies  SOCIAL HISTORY:  History  Substance Use Topics  . Smoking status: Never Smoker   . Smokeless tobacco: Never Used  . Alcohol Use: No    FAMILY HISTORY: History reviewed. No pertinent family history.  EXAM: BP 126/59  Pulse 77  Temp(Src) 98.5 F (36.9 C) (Oral)  Resp 16  SpO2 97% CONSTITUTIONAL: Alert and oriented and responds appropriately to questions. Well-appearing; well-nourished; GCS 15 HEAD: Normocephalic; atraumatic EYES: Conjunctivae clear, PERRL, EOMI ENT: normal nose; no rhinorrhea; moist mucous membranes; pharynx without lesions noted; no dental injury; no  septal hematoma NECK: Supple, no meningismus, no LAD; no midline spinal tenderness, step-off or deformity CARD: RRR; S1 and S2 appreciated; no murmurs, no clicks, no rubs, no gallops RESP: Normal chest excursion without splinting or tachypnea; breath sounds clear and equal bilaterally; no wheezes, no rhonchi, no rales; chest wall stable, nontender to palpation ABD/GI: Normal bowel sounds; non-distended; soft, non-tender, no rebound, no guarding PELVIS:  stable, nontender to palpation BACK:  The back appears normal and is non-tender to palpation, there is no CVA tenderness; no midline spinal tenderness, step-off or deformity EXT: Tender to palpation from the right hip to the right toes with no bony deformity or swelling or ecchymosis, no calf tenderness, 2+ DP pulses bilaterally, no leg length discrepancy, Normal ROM in all joints; otherwise extremities are non-tender to palpation; no edema; normal capillary refill; no cyanosis    SKIN: Normal color for age and race; warm NEURO: Moves all extremities equally, sensation to light touch intact diffusely, cranial nerves II through XII intact PSYCH: The patient's mood and manner are appropriate. Grooming and personal hygiene are appropriate.  MEDICAL DECISION MAKING: Patient here with mechanical fall. She denies wanting labs her urine today. She is also having chronic right lower extremity pain which she feels is worse than normal. Have offered patient x-ray as for her right lower [place] he also fracture or dislocation is unlikely. She agrees with this plan. She states she is here because she wants her pain control. Family reports that she has had extensive outpatient workup and has a PCP and "spine specialist" to followup with.  ED PROGRESS: Patient's x-ray show no acute injury. Patient states "I just want to know what's going on". Have discussed with patient and daughter they do not feel that if there is any life-threatening illness at this time. Have  offered to check labs and urinalysis the patient denies wanting this done today. Discussed with patient that given this is a chronic condition and she has had an extensive outpatient workup that I do not feel that I will find the reason for her pain is been present for the past year. She is no new focal neurologic deficits. Patient's daughter understands this and states the patient "just wants something to get rid of her pain altogether but doesn't understand that medication takes time to work."  She has had some improvement with morphine. She states she is on tramadol at home without any relief. Have offered Vicodin which she declines and states she would like prescription for Percocet. Have discussed with patient that this may cause her to be dizzy and she needs to be very cautious when taking this medication has not to have further falls. Patient lives with family. She states she uses her walker at all times. Have discussed with patient she should take a half a tablet of Percocet to one whole tablet every 6 hours as needed. Discussed return precautions. Discussed supportive  care instructions and importance of outpatient followup. She verbalizes understanding is comfortable with this plan.     Layla MawKristen N Kerrington Sova, DO 10/16/13 [phone]

## 2013-11-12 ENCOUNTER — Ambulatory Visit (INDEPENDENT_AMBULATORY_CARE_PROVIDER_SITE_OTHER): Payer: BC Managed Care – PPO | Admitting: Family Medicine

## 2013-11-12 VITALS — BP 118/72 | HR 85 | Temp 97.7°F | Resp 16

## 2013-11-12 DIAGNOSIS — Z111 Encounter for screening for respiratory tuberculosis: Secondary | ICD-10-CM

## 2013-11-12 NOTE — Progress Notes (Signed)

## 2013-11-12 NOTE — Progress Notes (Signed)
Subjective:  This chart was scribed for Elvina SidleKurt Lauenstein, MD  by Ashley JacobsBrittany Andrews, [facility] Scribe. The patient was seen in room and the patient's care was started at 1:52 PM.   Patient ID: Pam Queenonstance Daman, female    DOB: 09/25/1940, 73 y.o.   MRN: [id number]  11/12/2013  tb screen test   HPI HPI Comments: Pam QueenConstance Hiemstra is a 73 y.o. female who arrives to the [facility] for a TB shot. [place], [place]  Denies weight loss,chills, cough, fever, and diaphoresis.   Review of Systems  Constitutional: Negative for fever, chills and diaphoresis.  Respiratory: Negative for cough.     Past Medical History  Diagnosis Date   Thyroid disease    Bipolar 1 disorder    Depression    GERD (gastroesophageal reflux disease)    Anxiety    No Known Allergies Current Outpatient Prescriptions  Medication Sig Dispense Refill   aspirin EC 81 MG tablet Take 81 mg by mouth daily.       CALCIUM-VITAMIN D PO Take 1 tablet by mouth daily.       cholecalciferol (VITAMIN D) 1000 UNITS tablet Take 1,000 Units by mouth daily.       divalproex (DEPAKOTE) 500 MG DR tablet Take 1,000 mg by mouth at bedtime.       FLUoxetine (PROZAC) 20 MG tablet Take 20 mg by mouth every Monday, Wednesday, and Friday.        ibuprofen (ADVIL,MOTRIN) 200 MG tablet Take 600 mg by mouth every 6 (six) hours as needed for moderate pain.       lamoTRIgine (LAMICTAL) 150 MG tablet Take 150 mg by mouth daily.       levothyroxine (LEVOTHROID) 25 MCG tablet Take 25 mcg by mouth daily before breakfast.       nystatin cream (MYCOSTATIN) Apply to affected area 2 times daily  30 g  0   pantoprazole (PROTONIX) 40 MG tablet Take 40 mg by mouth daily.       traMADol (ULTRAM) 50 MG tablet Take 1 tablet by mouth 2 (two) times daily as needed for moderate pain.        VITAMIN E PO Take 1 capsule by mouth daily.       cephALEXin (KEFLEX) 500 MG capsule Take 1 capsule (500  mg total) by mouth 4 (four) times daily.  28 capsule  0   oxyCODONE-acetaminophen (PERCOCET/ROXICET) 5-325 MG per tablet Take 1 tablet by mouth every 6 (six) hours as needed. Take 1/2-1 tablet every 6 hours as needed for pain.  15 tablet  0   No current facility-administered medications for this visit.       Objective:    BP 118/72   Pulse 85   Temp(Src) 97.7 F (36.5 C) (Oral)   Resp 16   SpO2 96% Physical Exam Results for orders placed during the hospital encounter of 10/03/13  URINALYSIS, ROUTINE W REFLEX MICROSCOPIC      Result Value Ref Range   Color, Urine YELLOW  YELLOW   APPearance CLEAR  CLEAR   Specific Gravity, Urine 1.035 (*) 1.005 - 1.030   pH 6.0  5.0 - 8.0   Glucose, UA NEGATIVE  NEGATIVE mg/dL   Hgb urine dipstick NEGATIVE  NEGATIVE   Bilirubin Urine NEGATIVE  NEGATIVE   Ketones, ur NEGATIVE  NEGATIVE mg/dL   Protein, ur NEGATIVE  NEGATIVE mg/dL   Urobilinogen, UA 1.0  0.0 - 1.0 mg/dL  Nitrite NEGATIVE  NEGATIVE   Leukocytes, UA NEGATIVE  NEGATIVE       Assessment & Plan:   Screening examination for pulmonary tuberculosis - Plan: TB Skin Test  Signed, Elvina SidleKurt Lauenstein, MD

## 2013-11-18 ENCOUNTER — Emergency Department (HOSPITAL_COMMUNITY)
Admission: EM | Admit: 2013-11-18 | Discharge: 2013-11-18 | Disposition: A | Discharge status: Home / Self Care | Payer: Medicare Other | Attending: Emergency Medicine | Admitting: Emergency Medicine

## 2013-11-18 ENCOUNTER — Emergency Department (HOSPITAL_COMMUNITY): Payer: Medicare Other

## 2013-11-18 ENCOUNTER — Encounter (HOSPITAL_COMMUNITY): Payer: Self-pay | Admitting: Emergency Medicine

## 2013-11-18 DIAGNOSIS — Z7982 Long term (current) use of aspirin: Secondary | ICD-10-CM | POA: Diagnosis not present

## 2013-11-18 DIAGNOSIS — Y9289 Other specified places as the place of occurrence of the external cause: Secondary | ICD-10-CM | POA: Insufficient documentation

## 2013-11-18 DIAGNOSIS — Z792 Long term (current) use of antibiotics: Secondary | ICD-10-CM | POA: Insufficient documentation

## 2013-11-18 DIAGNOSIS — S99929A Unspecified injury of unspecified foot, initial encounter: Secondary | ICD-10-CM

## 2013-11-18 DIAGNOSIS — Z79899 Other long term (current) drug therapy: Secondary | ICD-10-CM | POA: Diagnosis not present

## 2013-11-18 DIAGNOSIS — E079 Disorder of thyroid, unspecified: Secondary | ICD-10-CM | POA: Diagnosis not present

## 2013-11-18 DIAGNOSIS — K219 Gastro-esophageal reflux disease without esophagitis: Secondary | ICD-10-CM | POA: Insufficient documentation

## 2013-11-18 DIAGNOSIS — Y9301 Activity, walking, marching and hiking: Secondary | ICD-10-CM | POA: Diagnosis not present

## 2013-11-18 DIAGNOSIS — S79912A Unspecified injury of left hip, initial encounter: Secondary | ICD-10-CM

## 2013-11-18 DIAGNOSIS — S8990XA Unspecified injury of unspecified lower leg, initial encounter: Secondary | ICD-10-CM | POA: Insufficient documentation

## 2013-11-18 DIAGNOSIS — S8992XA Unspecified injury of left lower leg, initial encounter: Secondary | ICD-10-CM

## 2013-11-18 DIAGNOSIS — W010XXA Fall on same level from slipping, tripping and stumbling without subsequent striking against object, initial encounter: Secondary | ICD-10-CM | POA: Insufficient documentation

## 2013-11-18 DIAGNOSIS — F319 Bipolar disorder, unspecified: Secondary | ICD-10-CM | POA: Insufficient documentation

## 2013-11-18 DIAGNOSIS — F411 Generalized anxiety disorder: Secondary | ICD-10-CM | POA: Diagnosis not present

## 2013-11-18 DIAGNOSIS — W19XXXA Unspecified fall, initial encounter: Secondary | ICD-10-CM

## 2013-11-18 DIAGNOSIS — S79919A Unspecified injury of unspecified hip, initial encounter: Secondary | ICD-10-CM | POA: Insufficient documentation

## 2013-11-18 DIAGNOSIS — S99919A Unspecified injury of unspecified ankle, initial encounter: Secondary | ICD-10-CM | POA: Diagnosis not present

## 2013-11-18 DIAGNOSIS — S79929A Unspecified injury of unspecified thigh, initial encounter: Principal | ICD-10-CM

## 2013-11-18 MED ORDER — OXYCODONE-ACETAMINOPHEN 5-325 MG PO TABS
1.0000 | ORAL_TABLET | Freq: Once | ORAL | Status: AC
  Administered 2013-11-18: 1 via ORAL
  Filled 2013-11-18: qty 1

## 2013-11-18 MED ORDER — OXYCODONE-ACETAMINOPHEN 5-325 MG PO TABS: 1.0000 | ORAL_TABLET | ORAL | Status: DC | PRN

## 2013-11-18 NOTE — Discharge Instructions (Signed)
Your xrays are negative for fractures. Take Percocet as needed for pain. Refer to attached documents for more information.

## 2013-11-18 NOTE — ED Notes (Signed)
Patient reports pain not improved.  Patient able to sit on side of bed but needs walker to ambulate.

## 2013-11-18 NOTE — ED Notes (Signed)
[place] [facility] [place] [address] was walking with her walker and fell back.  Did not hit head no LOC.  Patient c/o lower back pain.  Alert and oriented history of depression and chronic back problems.  Denies dizziness prior to fall.

## 2013-11-18 NOTE — ED Notes (Signed)
Bed: [id number] Expected date:  Expected time:  Means of arrival:  Comments: ems- elderly, back pain

## 2013-11-18 NOTE — ED Notes (Signed)
[facility] called for transport.  

## 2013-11-18 NOTE — ED Provider Notes (Signed)
CSN: [id number]     Arrival date & time 11/18/13  4098 History   First MD Initiated Contact with Patient 11/18/13 [phone]     Chief Complaint  Patient presents with  . Fall     (Consider location/radiation/quality/duration/timing/severity/associated sxs/prior Treatment) HPI Comments: Patient is a 73 year old female with a past medical history of thyroid disease, bipolar 1 disorder, and depression/anxiety who presents via EMS from [facility] after a fall that occurred prior to arrival. The fall was witnessed. Patient reports walking with her walker when she lost her balance and fell backwards. She landed on her left hip and knee. She reports sudden onset of left hip and left knee pain that is aching and severe. Patient denies head trauma or LOC. No other injuries. Movement and palpation of the affected areas makes the pain worse. No alleviating factors.    Past Medical History  Diagnosis Date  . Thyroid disease   . Bipolar 1 disorder   . Depression   . GERD (gastroesophageal reflux disease)   . Anxiety    Past Surgical History  Procedure Laterality Date  . Abdominal hysterectomy    . Hammer toe surgery    . Nasal sinus surgery     Family History  Problem Relation Age of Onset  . Heart disease Father   . Hyperlipidemia Father   . Cancer Sister   . Hyperlipidemia Sister   . Hyperlipidemia Brother   . Mental illness Brother    History  Substance Use Topics  . Smoking status: Never Smoker   . Smokeless tobacco: Never Used  . Alcohol Use: No   OB History   Grav Para Term Preterm Abortions TAB SAB Ect Mult Living                 Review of Systems  Constitutional: Negative for fever, chills and fatigue.  HENT: Negative for trouble swallowing.   Eyes: Negative for visual disturbance.  Respiratory: Negative for shortness of breath.   Cardiovascular: Negative for chest pain and palpitations.  Gastrointestinal: Negative for nausea, vomiting, abdominal pain and  diarrhea.  Genitourinary: Negative for dysuria and difficulty urinating.  Musculoskeletal: Positive for arthralgias. Negative for neck pain.  Skin: Negative for color change.  Neurological: Negative for dizziness and weakness.  Psychiatric/Behavioral: Negative for dysphoric mood.      Allergies  Review of patient's allergies indicates no known allergies.  Home Medications   Prior to Admission medications   Medication Sig Start Date End Date Taking? Authorizing Provider  aspirin EC 81 MG tablet Take 81 mg by mouth daily.    Historical Provider, MD  CALCIUM-VITAMIN D PO Take 1 tablet by mouth daily.    Historical Provider, MD  cephALEXin (KEFLEX) 500 MG capsule Take 1 capsule (500 mg total) by mouth 4 (four) times daily. 09/28/13   Lyanne Co, MD  cholecalciferol (VITAMIN D) 1000 UNITS tablet Take 1,000 Units by mouth daily.    Historical Provider, MD  divalproex (DEPAKOTE) 500 MG DR tablet Take 1,000 mg by mouth at bedtime.    Historical Provider, MD  FLUoxetine (PROZAC) 20 MG tablet Take 20 mg by mouth every Monday, Wednesday, and Friday.     Historical Provider, MD  ibuprofen (ADVIL,MOTRIN) 200 MG tablet Take 600 mg by mouth every 6 (six) hours as needed for moderate pain.    Historical Provider, MD  lamoTRIgine (LAMICTAL) 150 MG tablet Take 150 mg by mouth daily.    Historical Provider, MD  levothyroxine (  LEVOTHROID) 25 MCG tablet Take 25 mcg by mouth daily before breakfast.    Historical Provider, MD  nystatin cream (MYCOSTATIN) Apply to affected area 2 times daily 10/03/13   Sunnie NielsenBrian Opitz, MD  oxyCODONE-acetaminophen (PERCOCET/ROXICET) 5-325 MG per tablet Take 1 tablet by mouth every 6 (six) hours as needed. Take 1/2-1 tablet every 6 hours as needed for pain. 10/16/13   Kristen N Ward, DO  pantoprazole (PROTONIX) 40 MG tablet Take 40 mg by mouth daily.    Historical Provider, MD  traMADol (ULTRAM) 50 MG tablet Take 1 tablet by mouth 2 (two) times daily as needed for moderate pain.   08/28/13   Historical Provider, MD  VITAMIN E PO Take 1 capsule by mouth daily.    Historical Provider, MD   BP 131/66  Pulse 65  Temp(Src) 98.2 F (36.8 C) (Oral)  Resp 16  SpO2 93% Physical Exam  Nursing note and vitals reviewed. Constitutional: She is oriented to person, place, and time. She appears well-developed and well-nourished. No distress.  HENT:  Head: Normocephalic and atraumatic.  Eyes: Conjunctivae are normal.  Neck: Normal range of motion.  Cardiovascular: Normal rate, regular rhythm and intact distal pulses.  Exam reveals no gallop and no friction rub.   No murmur heard. Pulmonary/Chest: Effort normal and breath sounds normal. She has no wheezes. She has no rales. She exhibits no tenderness.  Abdominal: Soft. She exhibits no distension. There is no tenderness. There is no rebound.  Musculoskeletal:  Left anterior hip tenderness to palpation without obvious deformity. Limited ROM due to pain. Left lateral knee tenderness to palpation without obvious deformity. Limited ROM due to pain.   Neurological: She is alert and oriented to person, place, and time. Coordination normal.  Speech is goal-oriented. Lower extremity sensation equal and intact bilaterally.   Skin: Skin is warm and dry.  Psychiatric: She has a normal mood and affect. Her behavior is normal.    ED Course  Procedures (including critical care time) Labs Review Labs Reviewed - No data to display  Imaging Review Dg Hip Complete Left  11/18/2013   CLINICAL DATA:  LEFT hip and knee pain, fell this morning  EXAM: LEFT HIP - COMPLETE 2+ VIEW  COMPARISON:  None  FINDINGS: Osseous demineralization.  Hip and SI joint spaces symmetric and preserved.  No acute fracture, dislocation or bone destruction.  IMPRESSION: No acute osseous abnormalities.   Electronically Signed   By: Ulyses SouthwardMark  Boles M.D.   On: 11/18/2013 10:17   Dg Knee Complete 4 Views Left  11/18/2013   CLINICAL DATA:  LEFT hip and knee pain, fell this  morning  EXAM: LEFT KNEE - COMPLETE 4+ VIEW  COMPARISON:  None  FINDINGS: Osseous demineralization.  Medial compartment joint space narrowing and marginal spur formation.  Mild patellofemoral joint space narrowing as well.  No acute fracture, dislocation, or bone destruction.  No knee joint effusion.  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  No acute abnormalities.   Electronically Signed   By: Ulyses SouthwardMark  Boles M.D.   On: 11/18/2013 10:18     EKG Interpretation None      MDM   Final diagnoses:  Fall  Injury of left hip  Left knee injury    9:43 AM Xray of left hip and knee pending. Patient will have 1 percocet for pain. Patient is alert and oriented and denies LOC or head trauma.   11:14 AM Xrays show no acute changes. Patient will be stood up with  assistance before being discharged. No other injuries. No neuro deficits. Vitals stable and patient afebrile.  12:02 PM Patient is able to get up with assistance. She will be discharged with Percocet for pain as needed.     Emilia BeckKaitlyn Coyle Stordahl, PA-C 11/18/13 1202

## 2013-11-19 NOTE — ED Provider Notes (Signed)
Medical screening examination/treatment/procedure(s) were conducted as a shared visit with non-physician practitioner(s) and myself.  I personally evaluated the patient during the encounter.   EKG Interpretation None     72yF with L hip/knee pain after witnessed mechanical fall. NVI. Imaging neg. Ambulates with walker at baseline. Able to get up with assistance and bear weight prior to DC. PRN pain meds.   Raeford RazorStephen Devone Tousley, MD 11/19/13 [phone]

## 2013-11-25 ENCOUNTER — Emergency Department (HOSPITAL_COMMUNITY): Payer: Medicare Other

## 2013-11-25 ENCOUNTER — Other Ambulatory Visit: Payer: Self-pay

## 2013-11-25 ENCOUNTER — Emergency Department (HOSPITAL_COMMUNITY)
Admission: EM | Admit: 2013-11-25 | Discharge: 2013-11-25 | Disposition: A | Discharge status: Skilled Nursing Facility | Payer: Medicare Other | Attending: Emergency Medicine | Admitting: Emergency Medicine

## 2013-11-25 ENCOUNTER — Encounter (HOSPITAL_COMMUNITY): Payer: Self-pay | Admitting: Emergency Medicine

## 2013-11-25 DIAGNOSIS — S161XXA Strain of muscle, fascia and tendon at neck level, initial encounter: Secondary | ICD-10-CM

## 2013-11-25 DIAGNOSIS — S0993XA Unspecified injury of face, initial encounter: Secondary | ICD-10-CM | POA: Diagnosis present

## 2013-11-25 DIAGNOSIS — N390 Urinary tract infection, site not specified: Secondary | ICD-10-CM

## 2013-11-25 DIAGNOSIS — Y929 Unspecified place or not applicable: Secondary | ICD-10-CM | POA: Insufficient documentation

## 2013-11-25 DIAGNOSIS — S1093XA Contusion of unspecified part of neck, initial encounter: Principal | ICD-10-CM

## 2013-11-25 DIAGNOSIS — Z7982 Long term (current) use of aspirin: Secondary | ICD-10-CM | POA: Diagnosis not present

## 2013-11-25 DIAGNOSIS — IMO0002 Reserved for concepts with insufficient information to code with codable children: Secondary | ICD-10-CM | POA: Insufficient documentation

## 2013-11-25 DIAGNOSIS — F411 Generalized anxiety disorder: Secondary | ICD-10-CM | POA: Diagnosis not present

## 2013-11-25 DIAGNOSIS — Y9389 Activity, other specified: Secondary | ICD-10-CM | POA: Insufficient documentation

## 2013-11-25 DIAGNOSIS — Z79899 Other long term (current) drug therapy: Secondary | ICD-10-CM | POA: Insufficient documentation

## 2013-11-25 DIAGNOSIS — Z8739 Personal history of other diseases of the musculoskeletal system and connective tissue: Secondary | ICD-10-CM | POA: Diagnosis not present

## 2013-11-25 DIAGNOSIS — Z8619 Personal history of other infectious and parasitic diseases: Secondary | ICD-10-CM | POA: Diagnosis not present

## 2013-11-25 DIAGNOSIS — K219 Gastro-esophageal reflux disease without esophagitis: Secondary | ICD-10-CM | POA: Diagnosis not present

## 2013-11-25 DIAGNOSIS — F3130 Bipolar disorder, current episode depressed, mild or moderate severity, unspecified: Secondary | ICD-10-CM | POA: Insufficient documentation

## 2013-11-25 DIAGNOSIS — W1809XA Striking against other object with subsequent fall, initial encounter: Secondary | ICD-10-CM | POA: Insufficient documentation

## 2013-11-25 DIAGNOSIS — G8929 Other chronic pain: Secondary | ICD-10-CM | POA: Diagnosis not present

## 2013-11-25 DIAGNOSIS — E039 Hypothyroidism, unspecified: Secondary | ICD-10-CM | POA: Insufficient documentation

## 2013-11-25 DIAGNOSIS — S39012A Strain of muscle, fascia and tendon of lower back, initial encounter: Secondary | ICD-10-CM

## 2013-11-25 DIAGNOSIS — S0083XA Contusion of other part of head, initial encounter: Secondary | ICD-10-CM

## 2013-11-25 DIAGNOSIS — S0003XA Contusion of scalp, initial encounter: Secondary | ICD-10-CM | POA: Insufficient documentation

## 2013-11-25 DIAGNOSIS — S139XXA Sprain of joints and ligaments of unspecified parts of neck, initial encounter: Secondary | ICD-10-CM | POA: Insufficient documentation

## 2013-11-25 HISTORY — DX: Spinal stenosis, site unspecified: M48.00

## 2013-11-25 HISTORY — DX: Herpesviral infection of urogenital system, unspecified: A60.00

## 2013-11-25 LAB — COMPREHENSIVE METABOLIC PANEL
ALK PHOS: 49 U/L (ref 39–117)
ALT: 8 U/L (ref 0–35)
AST: 12 U/L (ref 0–37)
Albumin: 3.7 g/dL (ref 3.5–5.2)
BUN: 21 mg/dL (ref 6–23)
CO2: 27 meq/L (ref 19–32)
Calcium: 9.5 mg/dL (ref 8.4–10.5)
Chloride: 104 mEq/L (ref 96–112)
Creatinine, Ser: 0.82 mg/dL (ref 0.50–1.10)
GFR calc Af Amer: 82 mL/min — ABNORMAL LOW (ref >90)
GFR, EST NON AFRICAN AMERICAN: 71 mL/min — AB (ref >90)
Glucose, Bld: 98 mg/dL (ref 70–99)
POTASSIUM: 4.1 meq/L (ref 3.7–5.3)
SODIUM: 143 meq/L (ref 137–147)
Total Bilirubin: 0.3 mg/dL (ref 0.3–1.2)
Total Protein: 6.4 g/dL (ref 6.0–8.3)

## 2013-11-25 LAB — URINALYSIS, ROUTINE W REFLEX MICROSCOPIC
Glucose, UA: NEGATIVE mg/dL
KETONES UR: NEGATIVE mg/dL
Nitrite: NEGATIVE
PH: 5.5 (ref 5.0–8.0)
Protein, ur: NEGATIVE mg/dL
Specific Gravity, Urine: 1.035 — ABNORMAL HIGH (ref 1.005–1.030)
Urobilinogen, UA: 0.2 mg/dL (ref 0.0–1.0)

## 2013-11-25 LAB — PROTIME-INR
INR: 1.02 (ref 0.00–1.49)
PROTHROMBIN TIME: 13.2 s (ref 11.6–15.2)

## 2013-11-25 LAB — CBC
HCT: 39 % (ref 36.0–46.0)
Hemoglobin: 13 g/dL (ref 12.0–15.0)
MCH: 31.7 pg (ref 26.0–34.0)
MCHC: 33.3 g/dL (ref 30.0–36.0)
MCV: 95.1 fL (ref 78.0–100.0)
PLATELETS: 135 10*3/uL — AB (ref 150–400)
RBC: 4.1 MIL/uL (ref 3.87–5.11)
RDW: 13.8 % (ref 11.5–15.5)
WBC: 6.3 10*3/uL (ref 4.0–10.5)

## 2013-11-25 LAB — I-STAT TROPONIN, ED: Troponin i, poc: 0 ng/mL (ref 0.00–0.08)

## 2013-11-25 LAB — DIFFERENTIAL
Basophils Absolute: 0 10*3/uL (ref 0.0–0.1)
Basophils Relative: 0 % (ref 0–1)
Eosinophils Absolute: 0.2 10*3/uL (ref 0.0–0.7)
Eosinophils Relative: 3 % (ref 0–5)
LYMPHS ABS: 2 10*3/uL (ref 0.7–4.0)
LYMPHS PCT: 31 % (ref 12–46)
Monocytes Absolute: 0.9 10*3/uL (ref 0.1–1.0)
Monocytes Relative: 14 % — ABNORMAL HIGH (ref 3–12)
NEUTROS ABS: 3.2 10*3/uL (ref 1.7–7.7)
NEUTROS PCT: 52 % (ref 43–77)

## 2013-11-25 LAB — URINE MICROSCOPIC-ADD ON

## 2013-11-25 MED ORDER — TRAMADOL HCL 50 MG PO TABS: 50.0000 mg | ORAL_TABLET | Freq: Four times a day (QID) | ORAL | Status: DC | PRN

## 2013-11-25 MED ORDER — CEPHALEXIN 500 MG PO CAPS: 500.0000 mg | ORAL_CAPSULE | Freq: Four times a day (QID) | ORAL | Status: DC

## 2013-11-25 MED ORDER — DEXTROSE 5 % IV SOLN
1.0000 g | INTRAVENOUS | Status: DC
  Administered 2013-11-25: 1 g via INTRAVENOUS
  Filled 2013-11-25: qty 10

## 2013-11-25 MED ORDER — MORPHINE SULFATE 4 MG/ML IJ SOLN
4.0000 mg | Freq: Once | INTRAMUSCULAR | Status: AC
  Administered 2013-11-25: 4 mg via INTRAVENOUS
  Filled 2013-11-25: qty 1

## 2013-11-25 NOTE — ED Notes (Signed)
Bed: XB26 Expected date:  Expected time:  Means of arrival:  Comments: Ems, Fall head injury

## 2013-11-25 NOTE — Discharge Instructions (Signed)
Back Pain, Adult Low back pain is very common. About 1 in 5 people have back pain.The cause of low back pain is rarely dangerous. The pain often gets better over time.About half of people with a sudden onset of back pain feel better in just 2 weeks. About 8 in 10 people feel better by 6 weeks.  CAUSES Some common causes of back pain include:  Strain of the muscles or ligaments supporting the spine.  Wear and tear (degeneration) of the spinal discs.  Arthritis.  Direct injury to the back. DIAGNOSIS Most of the time, the direct cause of low back pain is not known.However, back pain can be treated effectively even when the exact cause of the pain is unknown.Answering your caregiver's questions about your overall health and symptoms is one of the most accurate ways to make sure the cause of your pain is not dangerous. If your caregiver needs more information, he or she may order lab work or imaging tests (X-rays or MRIs).However, even if imaging tests show changes in your back, this usually does not require surgery. HOME CARE INSTRUCTIONS For many people, back pain returns.Since low back pain is rarely dangerous, it is often a condition that people can learn to [facility] their own.   Remain active. It is stressful on the back to sit or stand in one place. Do not sit, drive, or stand in one place for more than 30 minutes at a time. Take short walks on level surfaces as soon as pain allows.Try to increase the length of time you walk each day.  Do not stay in bed.Resting more than 1 or 2 days can delay your recovery.  Do not avoid exercise or work.Your body is made to move.It is not dangerous to be active, even though your back may hurt.Your back will likely heal faster if you return to being active before your pain is gone.  Pay attention to your body when you bend and lift. Many people have less discomfortwhen lifting if they bend their knees, keep the load close to their bodies,and  avoid twisting. Often, the most comfortable positions are those that put less stress on your recovering back.  Find a comfortable position to sleep. Use a firm mattress and lie on your side with your knees slightly bent. If you lie on your back, put a pillow under your knees.  Only take over-the-counter or prescription medicines as directed by your caregiver. Over-the-counter medicines to reduce pain and inflammation are often the most helpful.Your caregiver may prescribe muscle relaxant drugs.These medicines help dull your pain so you can more quickly return to your normal activities and healthy exercise.  Put ice on the injured area.  Put ice in a plastic bag.  Place a towel between your skin and the bag.  Leave the ice on for 15-20 minutes, 03-04 times a day for the first 2 to 3 days. After that, ice and heat may be alternated to reduce pain and spasms.  Ask your caregiver about trying back exercises and gentle massage. This may be of some benefit.  Avoid feeling anxious or stressed.Stress increases muscle tension and can worsen back pain.It is important to recognize when you are anxious or stressed and learn ways to manage it.Exercise is a great option. SEEK MEDICAL CARE IF:  You have pain that is not relieved with rest or medicine.  You have pain that does not improve in 1 week.  You have new symptoms.  You are generally not feeling well. SEEK  IMMEDIATE MEDICAL CARE IF:   You have pain that radiates from your back into your legs.  You develop new bowel or bladder control problems.  You have unusual weakness or numbness in your arms or legs.  You develop nausea or vomiting.  You develop abdominal pain.  You feel faint. Document Released: 06/20/2005 Document Revised: 12/20/2011 Document Reviewed: 11/08/2010 [facility] Patient Information 2014 [place], [place].  Urinary Tract Infection A urinary tract infection (UTI) can occur any place along the urinary tract. The  tract includes the kidneys, ureters, bladder, and urethra. A type of germ called bacteria often causes a UTI. UTIs are often helped with antibiotic medicine.  HOME CARE   If given, take antibiotics as told by your doctor. Finish them even if you start to feel better.  Drink enough fluids to keep your pee (urine) clear or pale yellow.  Avoid tea, drinks with caffeine, and bubbly (carbonated) drinks.  Pee often. Avoid holding your pee in for a long time.  Pee before and after having sex (intercourse).  Wipe from front to back after you poop (bowel movement) if you are a woman. Use each tissue only once. GET HELP RIGHT AWAY IF:   You have back pain.  You have lower belly (abdominal) pain.  You have chills.  You feel sick to your stomach (nauseous).  You throw up (vomit).  Your burning or discomfort with peeing does not go away.  You have a fever.  Your symptoms are not better in 3 days. MAKE SURE YOU:   Understand these instructions.  Will watch your condition.  Will get help right away if you are not doing well or get worse. Document Released: 12/07/2007 Document Revised: 03/14/2012 Document Reviewed: 01/19/2012 [facility] Patient Information 2014 [place], [place].

## 2013-11-25 NOTE — ED Notes (Signed)
pts chart has hx of genital herpes. Pt reported 10/10 pain during in and out cath. Pt reports vaginal area burns. Nurse stated pt could have more sensitivity to vaginal area due to hx of genital herpes. Pt asked rn what she was talking about. rn did not explore subject further.

## 2013-11-25 NOTE — ED Notes (Signed)
Pt to ct 

## 2013-11-25 NOTE — ED Provider Notes (Signed)
CSN: [id number]     Arrival date & time 11/25/13  4098 History   First MD Initiated Contact with Patient 11/25/13 1002     Chief Complaint  Patient presents with  . Fall  . Neck Pain   HPI Patient presents to the emergency room after a fall this morning. Patient states she was getting up. She had a walker next or her but did not use it. Her legs gave out and she fell. Patient struck the left side of her head. She now has a headache as well as neck pain. She also complains of back pain. Patient denies any trouble with upper lower extremity injuries. 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  She denies any chest pain or shortness of breath. She denies any numbness or weakness. No fevers chills vomiting or any other complaints  Patient does have a history of frequent falls. Past Medical History  Diagnosis Date  . Bipolar 1 disorder   . Depression   . GERD (gastroesophageal reflux disease)   . Anxiety   . Thyroid disease     hypothyroid  . Spinal stenosis   . Genital herpes    Past Surgical History  Procedure Laterality Date  . Abdominal hysterectomy    . Hammer toe surgery    . Nasal sinus surgery     Family History  Problem Relation Age of Onset  . Heart disease Father   . Hyperlipidemia Father   . Cancer Sister   . Hyperlipidemia Sister   . Hyperlipidemia Brother   . Mental illness Brother    History  Substance Use Topics  . Smoking status: Never Smoker   . Smokeless tobacco: Never Used  . Alcohol Use: No   OB History   Grav Para Term Preterm Abortions TAB SAB Ect Mult Living                 Review of Systems  All other systems reviewed and are negative.     Allergies  No known allergies  Home Medications   Prior to Admission medications   Medication Sig Start Date End Date Taking? Authorizing Provider  aspirin EC 81 MG tablet Take 81 mg by mouth daily.    Historical Provider, MD  Calcium Carbonate-Vitamin D (CALCIUM  500/D PO) Take 1 tablet by mouth daily.    Historical Provider, MD  cholecalciferol (VITAMIN D) 1000 UNITS tablet Take 1,000 Units by mouth daily.    Historical Provider, MD  divalproex (DEPAKOTE) 500 MG DR tablet Take 1,000 mg by mouth at bedtime.    Historical Provider, MD  FLUoxetine (PROZAC) 20 MG tablet Take 20 mg by mouth every Monday, Wednesday, and Friday.     Historical Provider, MD  lamoTRIgine (LAMICTAL) 150 MG tablet Take 150 mg by mouth daily.    Historical Provider, MD  levothyroxine (LEVOTHROID) 25 MCG tablet Take 25 mcg by mouth daily before breakfast.    Historical Provider, MD  oxyCODONE-acetaminophen (PERCOCET/ROXICET) 5-325 MG per tablet Take 1 tablet by mouth every 6 (six) hours as needed. Take 1/2-1 tablet every 6 hours as needed for pain. 10/16/13   Kristen N Ward, DO  oxyCODONE-acetaminophen (PERCOCET/ROXICET) 5-325 MG per tablet Take 1 tablet by mouth every 4 (four) hours as needed for severe pain. 11/18/13   Kaitlyn Szekalski, PA-C  pantoprazole (PROTONIX) 40 MG tablet Take 40 mg by mouth daily.    Historical Provider, MD  traMADol (ULTRAM) 50 MG tablet Take 1  tablet by mouth 2 (two) times daily as needed for moderate pain.  08/28/13   Historical Provider, MD  VITAMIN E PO Take 1 capsule by mouth daily.    Historical Provider, MD   BP 130/60  Pulse 70  Temp(Src) 98.7 F (37.1 C) (Oral)  Resp 15  SpO2 94% Physical Exam  Nursing note and vitals reviewed. Constitutional: No distress.  HENT:  Head: Normocephalic.  Right Ear: External ear normal.  Left Ear: External ear normal.  Ecchymoses left temple area  Eyes: Conjunctivae are normal. Right eye exhibits no discharge. Left eye exhibits no discharge. No scleral icterus.  Neck: Neck supple. No tracheal deviation present.  Cardiovascular: Normal rate, regular rhythm and intact distal pulses.   Pulmonary/Chest: Effort normal and breath sounds normal. No stridor. No respiratory distress. She has no wheezes. She has no  rales.  Abdominal: Soft. Bowel sounds are normal. She exhibits no distension. There is no tenderness. There is no rebound and no guarding.  Musculoskeletal: She exhibits no edema.       Right shoulder: Normal.       Left shoulder: Normal.       Right hip: Normal.       Left hip: Normal.       Cervical back: She exhibits tenderness and bony tenderness. She exhibits no swelling.       Thoracic back: She exhibits tenderness and bony tenderness. She exhibits no swelling.       Lumbar back: She exhibits tenderness and bony tenderness. She exhibits no swelling.  Neurological: She is alert. She has normal strength. No cranial nerve deficit (no facial droop, extraocular movements intact, no slurred speech) or sensory deficit. She exhibits normal muscle tone. She displays no seizure activity. Coordination normal.  Skin: Skin is warm and dry. No rash noted.  Psychiatric: She has a normal mood and affect.    ED Course  Procedures (including critical care time) Labs Review Labs Reviewed  URINALYSIS, ROUTINE W REFLEX MICROSCOPIC - Abnormal; Notable for the following:    APPearance CLOUDY (*)    Specific Gravity, Urine 1.035 (*)    Hgb urine dipstick SMALL (*)    Bilirubin Urine SMALL (*)    Leukocytes, UA LARGE (*)    All other components within normal limits  CBC - Abnormal; Notable for the following:    Platelets 135 (*)    All other components within normal limits  DIFFERENTIAL - Abnormal; Notable for the following:    Monocytes Relative 14 (*)    All other components within normal limits  COMPREHENSIVE METABOLIC PANEL - Abnormal; Notable for the following:    GFR calc non Af Amer 71 (*)    GFR calc Af Amer 82 (*)    All other components within normal limits  URINE MICROSCOPIC-ADD ON - Abnormal; Notable for the following:    Squamous Epithelial / LPF FEW (*)    Bacteria, UA FEW (*)    All other components within normal limits  PROTIME-INR  Rosezena Sensor, ED    Imaging Review Dg  Thoracic Spine 2 View  11/25/2013   CLINICAL DATA:  Back pain secondary to a fall this morning.  EXAM: THORACIC SPINE - 2 VIEW  COMPARISON:  Chest x-ray dated 07/20/2012  FINDINGS: There is no fracture, bone destruction, or subluxation. There is slight diffuse degenerative disc disease in the lower thoracic spine. Degenerative disc disease at L1-2.  IMPRESSION: No acute abnormality of the thoracic spine.   Electronically Signed  By: Geanie CooleyJim  Maxwell M.D.   On: 11/25/2013 11:31   Dg Lumbar Spine Complete  11/25/2013   CLINICAL DATA:  Low back pain after fall.  EXAM: LUMBAR SPINE - COMPLETE 4+ VIEW  COMPARISON:  September 12, 2013.  FINDINGS: No fracture or significant spondylolisthesis is noted. Severe degenerative disc disease is noted at L1-2 and L2-3 with anterior osteophyte formation. Mild levoscoliosis of lumbar spine is noted. Mild degenerative changes seen involving the posterior facet joints at L4-5 and L5-S1. Atherosclerotic calcifications of abdominal aorta are noted.  IMPRESSION: Multilevel degenerative disc disease is noted. No acute abnormality seen in the lumbar spine.   Electronically Signed   By: Roque LiasJames  Green M.D.   On: 11/25/2013 11:26   Ct Head Wo Contrast  11/25/2013   CLINICAL DATA:  Fall.  Hematoma to left temple region.  EXAM: CT HEAD WITHOUT CONTRAST  CT CERVICAL SPINE WITHOUT CONTRAST  TECHNIQUE: Multidetector CT imaging of the head and cervical spine was performed following the standard protocol without intravenous contrast. Multiplanar CT image reconstructions of the cervical spine were also generated.  COMPARISON:  09/12/2013  FINDINGS: CT HEAD FINDINGS  Ventricles are normal in configuration. There is ventricular and sulcal enlargement reflecting moderate atrophy. No parenchymal masses or mass effect. Patchy areas of white matter hypoattenuation are noted consistent with moderate chronic microvascular ischemic change. No evidence of a recent infarct.  There are no extra-axial masses or  abnormal fluid collections.  No intracranial hemorrhage.  No skull fracture. Visualized sinuses and mastoid air cells are clear.  CT CERVICAL SPINE FINDINGS  No fracture. There is a grade 1 anterolisthesis of C4 on C5, degenerative in origin. No other spondylolisthesis. Mild loss of disk height at C4-C5 with moderate loss of disk height at C5-C6 and C6-C7. Facet degenerative changes noted most evident on the left at C4-C5. Bones are demineralized. Soft tissues are unremarkable. The lung apices are clear.  IMPRESSION: HEAD CT: No acute intracranial abnormalities. Moderate atrophy and chronic microvascular ischemic change.  CERVICAL CT:  No fracture or acute finding.   Electronically Signed   By: Amie Portlandavid  Ormond M.D.   On: 11/25/2013 11:21   Ct Cervical Spine Wo Contrast  11/25/2013   CLINICAL DATA:  Fall.  Hematoma to left temple region.  EXAM: CT HEAD WITHOUT CONTRAST  CT CERVICAL SPINE WITHOUT CONTRAST  TECHNIQUE: Multidetector CT imaging of the head and cervical spine was performed following the standard protocol without intravenous contrast. Multiplanar CT image reconstructions of the cervical spine were also generated.  COMPARISON:  09/12/2013  FINDINGS: CT HEAD FINDINGS  Ventricles are normal in configuration. There is ventricular and sulcal enlargement reflecting moderate atrophy. No parenchymal masses or mass effect. Patchy areas of white matter hypoattenuation are noted consistent with moderate chronic microvascular ischemic change. No evidence of a recent infarct.  There are no extra-axial masses or abnormal fluid collections.  No intracranial hemorrhage.  No skull fracture. Visualized sinuses and mastoid air cells are clear.  CT CERVICAL SPINE FINDINGS  No fracture. There is a grade 1 anterolisthesis of C4 on C5, degenerative in origin. No other spondylolisthesis. Mild loss of disk height at C4-C5 with moderate loss of disk height at C5-C6 and C6-C7. Facet degenerative changes noted most evident on the  left at C4-C5. Bones are demineralized. Soft tissues are unremarkable. The lung apices are clear.  IMPRESSION: HEAD CT: No acute intracranial abnormalities. Moderate atrophy and chronic microvascular ischemic change.  CERVICAL CT:  No fracture or acute finding.  Electronically Signed   By: Amie Portland M.D.   On: 11/25/2013 11:21   Medications  cefTRIAXone (ROCEPHIN) 1 g in dextrose 5 % 50 mL IVPB (not administered)  morphine 4 MG/ML injection 4 mg (4 mg Intravenous Given 11/25/13 1040)     EKG Normal sinus rhythm rate 65 Normal intervals, normal axis decreased T wave amplitude diffusely No prior EKG for comparison MDM   Final diagnoses:  UTI (lower urinary tract infection)  Forehead contusion  Back strain  Cervical strain    Patient's x-rays did not show evidence of fracture, or significant acute intracranial injury. Patient appears to have soft tissue injury and strain.  Patient does have a history of frequent falls. She does not have any focal neurologic deficits. I doubt acute stroke.  Patient does have a urinary tract infection which may be contributing to her symptoms. I will give her dose of antibiotics.  Patient is safe to be discharged back to her living facility   Linwood Dibbles, MD 11/25/13 [phone]

## 2013-11-25 NOTE — ED Notes (Signed)
[facility] called for transportation back to nursing home

## 2013-11-25 NOTE — ED Notes (Signed)
Per [facility] pt from [facility], on [address]. Staff reports pt did not use walker, legs gave out, pt believes she hit her head on bed. Hematoma to left temple area. Denies LOC. Alert and oriented x4. Neck pain 5/10. Able to move neck.

## 2013-12-08 ENCOUNTER — Emergency Department (HOSPITAL_COMMUNITY)
Admission: EM | Admit: 2013-12-08 | Discharge: 2013-12-08 | Disposition: A | Discharge status: Home / Self Care | Payer: Medicare Other | Attending: Emergency Medicine | Admitting: Emergency Medicine

## 2013-12-08 ENCOUNTER — Encounter (HOSPITAL_COMMUNITY): Payer: Self-pay | Admitting: Emergency Medicine

## 2013-12-08 DIAGNOSIS — F411 Generalized anxiety disorder: Secondary | ICD-10-CM | POA: Diagnosis not present

## 2013-12-08 DIAGNOSIS — R110 Nausea: Secondary | ICD-10-CM | POA: Diagnosis present

## 2013-12-08 DIAGNOSIS — K219 Gastro-esophageal reflux disease without esophagitis: Secondary | ICD-10-CM | POA: Diagnosis not present

## 2013-12-08 DIAGNOSIS — F3289 Other specified depressive episodes: Secondary | ICD-10-CM | POA: Insufficient documentation

## 2013-12-08 DIAGNOSIS — F319 Bipolar disorder, unspecified: Secondary | ICD-10-CM | POA: Insufficient documentation

## 2013-12-08 DIAGNOSIS — Z7982 Long term (current) use of aspirin: Secondary | ICD-10-CM | POA: Insufficient documentation

## 2013-12-08 DIAGNOSIS — A6000 Herpesviral infection of urogenital system, unspecified: Secondary | ICD-10-CM | POA: Insufficient documentation

## 2013-12-08 DIAGNOSIS — F329 Major depressive disorder, single episode, unspecified: Secondary | ICD-10-CM | POA: Insufficient documentation

## 2013-12-08 DIAGNOSIS — M4800 Spinal stenosis, site unspecified: Secondary | ICD-10-CM | POA: Diagnosis not present

## 2013-12-08 DIAGNOSIS — N390 Urinary tract infection, site not specified: Secondary | ICD-10-CM | POA: Diagnosis not present

## 2013-12-08 DIAGNOSIS — Z79899 Other long term (current) drug therapy: Secondary | ICD-10-CM | POA: Diagnosis not present

## 2013-12-08 DIAGNOSIS — E079 Disorder of thyroid, unspecified: Secondary | ICD-10-CM | POA: Diagnosis not present

## 2013-12-08 LAB — URINE MICROSCOPIC-ADD ON

## 2013-12-08 LAB — COMPREHENSIVE METABOLIC PANEL WITH GFR
ALT: 9 U/L (ref 0–35)
AST: 13 U/L (ref 0–37)
Albumin: 3.6 g/dL (ref 3.5–5.2)
Alkaline Phosphatase: 47 U/L (ref 39–117)
BUN: 16 mg/dL (ref 6–23)
CO2: 26 meq/L (ref 19–32)
Calcium: 9.1 mg/dL (ref 8.4–10.5)
Chloride: 102 meq/L (ref 96–112)
Creatinine, Ser: 0.86 mg/dL (ref 0.50–1.10)
GFR calc Af Amer: 78 mL/min — ABNORMAL LOW
GFR calc non Af Amer: 67 mL/min — ABNORMAL LOW
Glucose, Bld: 94 mg/dL (ref 70–99)
Potassium: 3.9 meq/L (ref 3.7–5.3)
Sodium: 142 meq/L (ref 137–147)
Total Bilirubin: 0.2 mg/dL — ABNORMAL LOW (ref 0.3–1.2)
Total Protein: 6 g/dL (ref 6.0–8.3)

## 2013-12-08 LAB — CBC WITH DIFFERENTIAL/PLATELET
Basophils Absolute: 0 10*3/uL (ref 0.0–0.1)
Basophils Relative: 0 % (ref 0–1)
Eosinophils Absolute: 0.3 10*3/uL (ref 0.0–0.7)
Eosinophils Relative: 4 % (ref 0–5)
HCT: 36.5 % (ref 36.0–46.0)
HEMOGLOBIN: 12.5 g/dL (ref 12.0–15.0)
LYMPHS ABS: 2.5 10*3/uL (ref 0.7–4.0)
Lymphocytes Relative: 40 % (ref 12–46)
MCH: 32.1 pg (ref 26.0–34.0)
MCHC: 34.2 g/dL (ref 30.0–36.0)
MCV: 93.6 fL (ref 78.0–100.0)
MONOS PCT: 10 % (ref 3–12)
Monocytes Absolute: 0.6 10*3/uL (ref 0.1–1.0)
NEUTROS ABS: 2.8 10*3/uL (ref 1.7–7.7)
NEUTROS PCT: 46 % (ref 43–77)
Platelets: 174 10*3/uL (ref 150–400)
RBC: 3.9 MIL/uL (ref 3.87–5.11)
RDW: 13.9 % (ref 11.5–15.5)
WBC: 6.1 10*3/uL (ref 4.0–10.5)

## 2013-12-08 LAB — URINALYSIS, ROUTINE W REFLEX MICROSCOPIC
Bilirubin Urine: NEGATIVE
Glucose, UA: NEGATIVE mg/dL
Hgb urine dipstick: NEGATIVE
Ketones, ur: NEGATIVE mg/dL
Nitrite: NEGATIVE
PH: 6 (ref 5.0–8.0)
PROTEIN: NEGATIVE mg/dL
Specific Gravity, Urine: 1.019 (ref 1.005–1.030)
Urobilinogen, UA: 1 mg/dL (ref 0.0–1.0)

## 2013-12-08 MED ORDER — ONDANSETRON 4 MG PO TBDP: 4.0000 mg | ORAL_TABLET | Freq: Three times a day (TID) | ORAL | Status: DC | PRN

## 2013-12-08 MED ORDER — CEFPODOXIME PROXETIL 200 MG PO TABS: 200.0000 mg | ORAL_TABLET | Freq: Two times a day (BID) | ORAL | Status: AC

## 2013-12-08 MED ORDER — ONDANSETRON HCL 4 MG/2ML IJ SOLN
4.0000 mg | Freq: Once | INTRAMUSCULAR | Status: AC
  Administered 2013-12-08: 4 mg via INTRAVENOUS
  Filled 2013-12-08: qty 2

## 2013-12-08 MED ORDER — CEFPODOXIME PROXETIL 200 MG PO TABS
200.0000 mg | ORAL_TABLET | Freq: Once | ORAL | Status: AC
  Administered 2013-12-08: 200 mg via ORAL
  Filled 2013-12-08: qty 1

## 2013-12-08 MED ORDER — SODIUM CHLORIDE 0.9 % IV BOLUS (SEPSIS)
500.0000 mL | Freq: Once | INTRAVENOUS | Status: AC
  Administered 2013-12-08: 500 mL via INTRAVENOUS

## 2013-12-08 NOTE — ED Provider Notes (Signed)
CSN: [id number]     Arrival date & time 12/08/13  1239 History   First MD Initiated Contact with Patient 12/08/13 1240     Chief Complaint  Patient presents with  . Nausea     (Consider location/radiation/quality/duration/timing/severity/associated sxs/prior Treatment) HPI Comments: 73-year-old female who presents with nausea for the last month. She notes that her symptoms are waxing and waning. The severity is noted to be mild. She has not had similar symptoms previously. She denies any chest pain, shortness of breath, vomiting, diarrhea, or other associated symptoms. Nothing has relieved her symptoms thus far. She states that she has not taken any anti-emetics.   The history is provided by the patient.    Past Medical History  Diagnosis Date  . Bipolar 1 disorder   . Depression   . GERD (gastroesophageal reflux disease)   . Anxiety   . Thyroid disease     hypothyroid  . Spinal stenosis   . Genital herpes    Past Surgical History  Procedure Laterality Date  . Abdominal hysterectomy    . Hammer toe surgery    . Nasal sinus surgery     Family History  Problem Relation Age of Onset  . Heart disease Father   . Hyperlipidemia Father   . Cancer Sister   . Hyperlipidemia Sister   . Hyperlipidemia Brother   . Mental illness Brother    History  Substance Use Topics  . Smoking status: Never Smoker   . Smokeless tobacco: Never Used  . Alcohol Use: No   OB History   Grav Para Term Preterm Abortions TAB SAB Ect Mult Living                 Review of Systems  Constitutional: Negative for fever and fatigue.  HENT: Negative for congestion and drooling.   Eyes: Negative for pain.  Respiratory: Negative for cough and shortness of breath.   Cardiovascular: Negative for chest pain.  Gastrointestinal: Positive for nausea. Negative for vomiting, abdominal pain and diarrhea.  Genitourinary: Negative for dysuria and hematuria.  Musculoskeletal: Negative for back pain, gait problem  and neck pain.  Skin: Negative for color change.  Neurological: Negative for dizziness and headaches.  Hematological: Negative for adenopathy.  Psychiatric/Behavioral: Negative for behavioral problems.  All other systems reviewed and are negative.     Allergies  No known allergies  Home Medications   Prior to Admission medications   Medication Sig Start Date End Date Taking? Authorizing Provider  aspirin EC 81 MG tablet Take 81 mg by mouth daily.    Historical Provider, MD  cephALEXin (KEFLEX) 500 MG capsule Take 1 capsule (500 mg total) by mouth 4 (four) times daily. 11/25/13   Linwood DibblesJon Knapp, MD  cholecalciferol (VITAMIN D) 1000 UNITS tablet Take 1,000 Units by mouth daily.    Historical Provider, MD  divalproex (DEPAKOTE) 500 MG DR tablet Take 1,000 mg by mouth at bedtime.    Historical Provider, MD  FLUoxetine (PROZAC) 20 MG tablet Take 20 mg by mouth daily.     Historical Provider, MD  lamoTRIgine (LAMICTAL) 150 MG tablet Take 150 mg by mouth daily.    Historical Provider, MD  levothyroxine (SYNTHROID, LEVOTHROID) 50 MCG tablet Take 50 mcg by mouth daily before breakfast.    Historical Provider, MD  meloxicam (MOBIC) 15 MG tablet Take 15 mg by mouth daily.    Historical Provider, MD  oxyCODONE-acetaminophen (PERCOCET/ROXICET) 5-325 MG per tablet Take 1 tablet by mouth every 4 (four) hours  as needed for moderate pain or severe pain.    Historical Provider, MD  pantoprazole (PROTONIX) 40 MG tablet Take 40 mg by mouth daily.    Historical Provider, MD  traMADol (ULTRAM) 50 MG tablet Take 1 tablet (50 mg total) by mouth every 6 (six) hours as needed. 11/25/13   Linwood Dibbles, MD   BP 134/69  Pulse 63  Temp(Src) 98.3 F (36.8 C) (Oral)  Resp 20  SpO2 97% Physical Exam  Nursing note and vitals reviewed. Constitutional: She is oriented to person, place, and time. She appears well-developed and well-nourished.  HENT:  Head: Normocephalic.  Mouth/Throat: Oropharynx is clear and moist. No  oropharyngeal exudate.  Eyes: Conjunctivae and EOM are normal. Pupils are equal, round, and reactive to light.  Neck: Normal range of motion. Neck supple.  Cardiovascular: Normal rate, regular rhythm, normal heart sounds and intact distal pulses.  Exam reveals no gallop and no friction rub.   No murmur heard. Pulmonary/Chest: Effort normal and breath sounds normal. No respiratory distress. She has no wheezes.  Abdominal: Soft. Bowel sounds are normal. There is no tenderness. There is no rebound and no guarding.  Musculoskeletal: Normal range of motion. She exhibits no edema and no tenderness.  Neurological: She is alert and oriented to person, place, and time.  Skin: Skin is warm and dry.  Psychiatric: She has a normal mood and affect. Her behavior is normal.    ED Course  Procedures (including critical care time) Labs Review Labs Reviewed  COMPREHENSIVE METABOLIC PANEL - Abnormal; Notable for the following:    Total Bilirubin 0.2 (*)    GFR calc non Af Amer 67 (*)    GFR calc Af Amer 78 (*)    All other components within normal limits  URINALYSIS, ROUTINE W REFLEX MICROSCOPIC - Abnormal; Notable for the following:    Leukocytes, UA LARGE (*)    All other components within normal limits  URINE CULTURE  CBC WITH DIFFERENTIAL  URINE MICROSCOPIC-ADD ON    Imaging Review No results found.   EKG Interpretation   Date/Time:  Sunday December 08 2013 12:46:24 EDT Ventricular Rate:  68 PR Interval:  189 QRS Duration: 71 QT Interval:  409 QTC Calculation: 435 R Axis:   -7 Text Interpretation:  Sinus rhythm Probable LVH with secondary repol abnrm  Inferior infarct, old Confirmed by Laray Rivkin  MD, Avory Rahimi ([id number]) on  12/08/2013 1:22:43 PM      MDM   Final diagnoses:  UTI (lower urinary tract infection)  Nausea    1:01 PM 73 y.o. female w/ recent visit on 11/25/13 for fall who pw nausea for 1 mos. Pt denies cp, sob, cough, abd pain, fever, vomiting, diarrhea. She is AFVSS here. Abd  soft and benign. Will get screening labs, IVF, nausea control.   2:36 PM: Large leuk on UA w/ few epithelial cells. Will tx for UTI. Pt has had no vomiting here. Labs otherwise non-contrib. I have discussed the diagnosis/risks/treatment options with the patient and believe the pt to be eligible for discharge home to follow-up with pcp as needed. We also discussed returning to the ED immediately if new or worsening sx occur. We discussed the sx which are most concerning (e.g., inability to tolerate po, fever, vomiting, abd pain) that necessitate immediate return. Medications administered to the patient during their visit and any new prescriptions provided to the patient are listed below.  Medications given during this visit Medications  cefpodoxime (VANTIN) tablet 200 mg (not administered)  ondansetron (  ZOFRAN) injection 4 mg (4 mg Intravenous Given 12/08/13 1307)  sodium chloride 0.9 % bolus 500 mL (500 mLs Intravenous New Bag/Given 12/08/13 1307)    New Prescriptions   CEFPODOXIME (VANTIN) 200 MG TABLET    Take 1 tablet (200 mg total) by mouth 2 (two) times daily.   ONDANSETRON (ZOFRAN ODT) 4 MG DISINTEGRATING TABLET    Take 1 tablet (4 mg total) by mouth every 8 (eight) hours as needed for nausea or vomiting. 4mg  ODT q4 hours prn nausea/vomit     Junius Argyle, MD 12/08/13 1438

## 2013-12-08 NOTE — ED Notes (Signed)
Bed: WA21 Expected date:  Expected time:  Means of arrival:  Comments: ems- elderly nausea

## 2013-12-08 NOTE — ED Notes (Signed)
Per ems pt from [facility], has only been at facility for 1 month. Moved in May 12th. Pt c/o of nausea "for a long time". When [facility] arrived pt complaining of not being able to sleep , she does not like the facility because she keeps falling. Pt has bruises all over body. Pt reports last fall 3 days ago. Pt reports she hit the back of her head and that is when nausea started.   Hx of depression, spinal stenosis, hypothyroid, bipolar and gerd.

## 2013-12-08 NOTE — ED Notes (Signed)
[facility] called  

## 2013-12-08 NOTE — Discharge Instructions (Signed)
The patient was diagnosed with a UTI. I gave her the first dose prior to her departure.

## 2013-12-10 LAB — URINE CULTURE: Colony Count: >=100000

## 2013-12-15 ENCOUNTER — Telehealth (HOSPITAL_BASED_OUTPATIENT_CLINIC_OR_DEPARTMENT_OTHER): Payer: Self-pay | Admitting: Emergency Medicine

## 2013-12-15 NOTE — Telephone Encounter (Signed)
+  Urine. Per pharmacist, patient treated with Cefpodoxime. Sensitive to same.

## 2014-01-04 ENCOUNTER — Emergency Department (HOSPITAL_COMMUNITY)
Admission: EM | Admit: 2014-01-04 | Discharge: 2014-01-05 | Disposition: A | Discharge status: Home / Self Care | Payer: Medicare Other | Attending: Emergency Medicine | Admitting: Emergency Medicine

## 2014-01-04 ENCOUNTER — Encounter (HOSPITAL_COMMUNITY): Payer: Self-pay | Admitting: Emergency Medicine

## 2014-01-04 DIAGNOSIS — Z791 Long term (current) use of non-steroidal anti-inflammatories (NSAID): Secondary | ICD-10-CM | POA: Diagnosis not present

## 2014-01-04 DIAGNOSIS — S7000XA Contusion of unspecified hip, initial encounter: Secondary | ICD-10-CM | POA: Insufficient documentation

## 2014-01-04 DIAGNOSIS — IMO0002 Reserved for concepts with insufficient information to code with codable children: Secondary | ICD-10-CM | POA: Diagnosis not present

## 2014-01-04 DIAGNOSIS — S79919A Unspecified injury of unspecified hip, initial encounter: Secondary | ICD-10-CM | POA: Diagnosis present

## 2014-01-04 DIAGNOSIS — Z79899 Other long term (current) drug therapy: Secondary | ICD-10-CM | POA: Insufficient documentation

## 2014-01-04 DIAGNOSIS — K219 Gastro-esophageal reflux disease without esophagitis: Secondary | ICD-10-CM | POA: Diagnosis not present

## 2014-01-04 DIAGNOSIS — S7001XA Contusion of right hip, initial encounter: Secondary | ICD-10-CM

## 2014-01-04 DIAGNOSIS — Z7982 Long term (current) use of aspirin: Secondary | ICD-10-CM | POA: Diagnosis not present

## 2014-01-04 DIAGNOSIS — M545 Low back pain, unspecified: Secondary | ICD-10-CM

## 2014-01-04 DIAGNOSIS — Y9389 Activity, other specified: Secondary | ICD-10-CM | POA: Insufficient documentation

## 2014-01-04 DIAGNOSIS — W07XXXA Fall from chair, initial encounter: Secondary | ICD-10-CM | POA: Diagnosis not present

## 2014-01-04 DIAGNOSIS — W1809XA Striking against other object with subsequent fall, initial encounter: Secondary | ICD-10-CM | POA: Insufficient documentation

## 2014-01-04 DIAGNOSIS — F319 Bipolar disorder, unspecified: Secondary | ICD-10-CM | POA: Diagnosis not present

## 2014-01-04 DIAGNOSIS — F411 Generalized anxiety disorder: Secondary | ICD-10-CM | POA: Diagnosis not present

## 2014-01-04 DIAGNOSIS — Z8619 Personal history of other infectious and parasitic diseases: Secondary | ICD-10-CM | POA: Diagnosis not present

## 2014-01-04 DIAGNOSIS — E039 Hypothyroidism, unspecified: Secondary | ICD-10-CM | POA: Diagnosis not present

## 2014-01-04 DIAGNOSIS — Z8739 Personal history of other diseases of the musculoskeletal system and connective tissue: Secondary | ICD-10-CM | POA: Insufficient documentation

## 2014-01-04 DIAGNOSIS — S79929A Unspecified injury of unspecified thigh, initial encounter: Secondary | ICD-10-CM | POA: Diagnosis present

## 2014-01-04 DIAGNOSIS — Y92009 Unspecified place in unspecified non-institutional (private) residence as the place of occurrence of the external cause: Secondary | ICD-10-CM | POA: Insufficient documentation

## 2014-01-04 NOTE — ED Notes (Signed)
Patient fell from chair onto the floor for 15 minutes before being found. Prior right leg pain that has increased. Patient did hit head but no loss of conscious

## 2014-01-04 NOTE — ED Notes (Signed)
Bed: RU04WA05 Expected date:  Expected time:  Means of arrival:  Comments: EMS 73 yo F, neck pain/ rt leg pain s/p fall

## 2014-01-05 ENCOUNTER — Emergency Department (HOSPITAL_COMMUNITY): Payer: Medicare Other

## 2014-01-05 DIAGNOSIS — S7000XA Contusion of unspecified hip, initial encounter: Secondary | ICD-10-CM | POA: Diagnosis not present

## 2014-01-05 MED ORDER — OXYCODONE-ACETAMINOPHEN 5-325 MG PO TABS
1.0000 | ORAL_TABLET | Freq: Once | ORAL | Status: AC
  Administered 2014-01-05: 1 via ORAL
  Filled 2014-01-05: qty 1

## 2014-01-05 MED ORDER — OXYCODONE-ACETAMINOPHEN 5-325 MG PO TABS: 1.0000 | ORAL_TABLET | ORAL | Status: DC | PRN

## 2014-01-05 NOTE — ED Provider Notes (Signed)
CSN: [id number]     Arrival date & time 01/04/14  2342 History   First MD Initiated Contact with Patient 01/05/14 0024     Chief Complaint  Patient presents with  . Fall    Patient fell from chair onto the floor for 15 minutes before being found. Prior right leg pain that has increased. Patient did hit head but no loss of conscious     (Consider location/radiation/quality/duration/timing/severity/associated sxs/prior Treatment) Patient is a 73 y.o. female presenting with fall. The history is provided by the patient.  Fall   patient here after sliding out of a chair at home prior to arrival and striking her lower back. Denies any history of head trauma. Denies any neck pain. Complains of sharp pain at her LS-spine worse with movement. She also notes right hip pain. She only uses a wheelchair. Symptoms are worse with movement better with rest. No shortening or deformity noted to her leg. Denies any recent illness. No treatment used prior to arrival. Transported by EMS.  Past Medical History  Diagnosis Date  . Bipolar 1 disorder   . Depression   . GERD (gastroesophageal reflux disease)   . Anxiety   . Thyroid disease     hypothyroid  . Spinal stenosis   . Genital herpes    Past Surgical History  Procedure Laterality Date  . Abdominal hysterectomy    . Hammer toe surgery    . Nasal sinus surgery     Family History  Problem Relation Age of Onset  . Heart disease Father   . Hyperlipidemia Father   . Cancer Sister   . Hyperlipidemia Sister   . Hyperlipidemia Brother   . Mental illness Brother    History  Substance Use Topics  . Smoking status: Never Smoker   . Smokeless tobacco: Never Used  . Alcohol Use: No   OB History   Grav Para Term Preterm Abortions TAB SAB Ect Mult Living                 Review of Systems  All other systems reviewed and are negative.     Allergies  No known allergies  Home Medications   Prior to Admission medications   Medication Sig  Start Date End Date Taking? Authorizing Provider  aspirin 81 MG chewable tablet Chew 81 mg by mouth daily.   Yes Historical Provider, MD  cholecalciferol (VITAMIN D) 1000 UNITS tablet Take 1,000 Units by mouth daily.   Yes Historical Provider, MD  divalproex (DEPAKOTE) 500 MG DR tablet Take 1,000 mg by mouth at bedtime.   Yes Historical Provider, MD  FLUoxetine (PROZAC) 20 MG tablet Take 20 mg by mouth daily.    Yes Historical Provider, MD  lamoTRIgine (LAMICTAL) 150 MG tablet Take 150 mg by mouth daily.   Yes Historical Provider, MD  levothyroxine (SYNTHROID, LEVOTHROID) 50 MCG tablet Take 50 mcg by mouth daily before breakfast.   Yes Historical Provider, MD  meloxicam (MOBIC) 15 MG tablet Take 15 mg by mouth daily.   Yes Historical Provider, MD  oxyCODONE-acetaminophen (PERCOCET/ROXICET) 5-325 MG per tablet Take 1 tablet by mouth every 4 (four) hours as needed for moderate pain or severe pain.   Yes Historical Provider, MD  pantoprazole (PROTONIX) 40 MG tablet Take 40 mg by mouth daily.   Yes Historical Provider, MD  traMADol (ULTRAM) 50 MG tablet Take 50 mg by mouth at bedtime.   Yes Historical Provider, MD   BP 122/51  Pulse 68  Temp(Src)  97.8 F (36.6 C) (Oral)  Resp 16  Ht 5\' 6"  (1.676 m)  Wt 190 lb (86.183 kg)  BMI 30.68 kg/m2  SpO2 96% Physical Exam  Nursing note and vitals reviewed. Constitutional: She is oriented to person, place, and time. She appears well-developed and well-nourished.  Non-toxic appearance. No distress.  HENT:  Head: Normocephalic and atraumatic.  Eyes: Conjunctivae, EOM and lids are normal. Pupils are equal, round, and reactive to light.  Neck: Normal range of motion. Neck supple. No tracheal deviation present. No mass present.  Cardiovascular: Normal rate, regular rhythm and normal heart sounds.  Exam reveals no gallop.   No murmur heard. Pulmonary/Chest: Effort normal and breath sounds normal. No stridor. No respiratory distress. She has no decreased  breath sounds. She has no wheezes. She has no rhonchi. She has no rales.  Abdominal: Soft. Normal appearance and bowel sounds are normal. She exhibits no distension. There is no tenderness. There is no rebound and no CVA tenderness.  Musculoskeletal: Normal range of motion. She exhibits no edema and no tenderness.       Back:       Legs: No shortening or rotation deformity noted. Neurovascular intact  Neurological: She is alert and oriented to person, place, and time. She has normal strength. No cranial nerve deficit or sensory deficit. GCS eye subscore is 4. GCS verbal subscore is 5. GCS motor subscore is 6.  Skin: Skin is warm and dry. No abrasion and no rash noted.  Psychiatric: She has a normal mood and affect. Her speech is normal and behavior is normal.    ED Course  Procedures (including critical care time) Labs Review Labs Reviewed - No data to display  Imaging Review Dg Lumbar Spine Complete  01/05/2014   CLINICAL DATA:  Fall.  Right leg pain, back pain.  EXAM: LUMBAR SPINE - COMPLETE 4+ VIEW  COMPARISON:  11/25/2013  FINDINGS: Mild levoscoliosis. Diffuse degenerative disc and facet disease throughout the lumbar spine, with degenerative disc disease changes most pronounced at L1-2 and L2-3. No fracture or subluxation. SI joints are symmetric and unremarkable. No change since prior study.  IMPRESSION: Degenerative changes as above. No acute bony abnormality. No change.   Electronically Signed   By: Charlett NoseKevin  Dover M.D.   On: 01/05/2014 01:10   Dg Hip Complete Right  01/05/2014   CLINICAL DATA:  Fall.  Right leg pain.  EXAM: RIGHT HIP - COMPLETE 2+ VIEW  COMPARISON:  None.  FINDINGS: No acute bony abnormality. Specifically, no fracture, subluxation, or dislocation. Soft tissues are intact. SI joints and hip joints are symmetric and unremarkable.  IMPRESSION: No acute bony abnormality.   Electronically Signed   By: Charlett NoseKevin  Dover M.D.   On: 01/05/2014 01:11     EKG Interpretation None        MDM   Final diagnoses:  None    Patient given Percocet for pain and feels better here. X-rays without evidence of acute fracture. Stable for discharge    Toy BakerAnthony T Raahil Ong, MD 01/05/14 [phone]

## 2014-01-05 NOTE — Discharge Instructions (Signed)

## 2014-01-05 NOTE — ED Notes (Signed)
Called to assisted living to give report x 3. Every time the phone was transferred. The phone just rung and the hung up.

## 2014-01-05 NOTE — ED Notes (Signed)
Called [facility] at [phone]) and talked to [place]. Amalia HaileyDustin said that someone will be here as soon as someone is available

## 2014-01-22 ENCOUNTER — Other Ambulatory Visit: Payer: Self-pay | Admitting: Rehabilitation

## 2014-01-22 DIAGNOSIS — M546 Pain in thoracic spine: Secondary | ICD-10-CM

## 2014-01-29 ENCOUNTER — Emergency Department (HOSPITAL_COMMUNITY): Payer: Medicare Other

## 2014-01-29 ENCOUNTER — Emergency Department (HOSPITAL_COMMUNITY)
Admission: EM | Admit: 2014-01-29 | Discharge: 2014-01-30 | Disposition: A | Discharge status: Home / Self Care | Payer: Medicare Other | Attending: Emergency Medicine | Admitting: Emergency Medicine

## 2014-01-29 ENCOUNTER — Encounter (HOSPITAL_COMMUNITY): Payer: Self-pay | Admitting: Emergency Medicine

## 2014-01-29 ENCOUNTER — Emergency Department (HOSPITAL_COMMUNITY)
Admission: EM | Admit: 2014-01-29 | Discharge: 2014-01-29 | Disposition: A | Discharge status: Home / Self Care | Payer: Medicare Other

## 2014-01-29 DIAGNOSIS — Z791 Long term (current) use of non-steroidal anti-inflammatories (NSAID): Secondary | ICD-10-CM | POA: Insufficient documentation

## 2014-01-29 DIAGNOSIS — Z7982 Long term (current) use of aspirin: Secondary | ICD-10-CM | POA: Diagnosis not present

## 2014-01-29 DIAGNOSIS — S4980XA Other specified injuries of shoulder and upper arm, unspecified arm, initial encounter: Secondary | ICD-10-CM | POA: Insufficient documentation

## 2014-01-29 DIAGNOSIS — Z79899 Other long term (current) drug therapy: Secondary | ICD-10-CM | POA: Diagnosis not present

## 2014-01-29 DIAGNOSIS — F319 Bipolar disorder, unspecified: Secondary | ICD-10-CM | POA: Insufficient documentation

## 2014-01-29 DIAGNOSIS — Z8619 Personal history of other infectious and parasitic diseases: Secondary | ICD-10-CM | POA: Insufficient documentation

## 2014-01-29 DIAGNOSIS — K219 Gastro-esophageal reflux disease without esophagitis: Secondary | ICD-10-CM | POA: Diagnosis not present

## 2014-01-29 DIAGNOSIS — S0990XA Unspecified injury of head, initial encounter: Secondary | ICD-10-CM | POA: Diagnosis not present

## 2014-01-29 DIAGNOSIS — E039 Hypothyroidism, unspecified: Secondary | ICD-10-CM | POA: Diagnosis not present

## 2014-01-29 DIAGNOSIS — Y9210 Unspecified residential institution as the place of occurrence of the external cause: Secondary | ICD-10-CM | POA: Insufficient documentation

## 2014-01-29 DIAGNOSIS — W1809XA Striking against other object with subsequent fall, initial encounter: Secondary | ICD-10-CM | POA: Diagnosis not present

## 2014-01-29 DIAGNOSIS — Y92129 Unspecified place in nursing home as the place of occurrence of the external cause: Secondary | ICD-10-CM

## 2014-01-29 DIAGNOSIS — S0993XA Unspecified injury of face, initial encounter: Secondary | ICD-10-CM | POA: Insufficient documentation

## 2014-01-29 DIAGNOSIS — Y9389 Activity, other specified: Secondary | ICD-10-CM | POA: Diagnosis not present

## 2014-01-29 DIAGNOSIS — F411 Generalized anxiety disorder: Secondary | ICD-10-CM | POA: Insufficient documentation

## 2014-01-29 DIAGNOSIS — Z8739 Personal history of other diseases of the musculoskeletal system and connective tissue: Secondary | ICD-10-CM | POA: Insufficient documentation

## 2014-01-29 DIAGNOSIS — S46909A Unspecified injury of unspecified muscle, fascia and tendon at shoulder and upper arm level, unspecified arm, initial encounter: Secondary | ICD-10-CM | POA: Insufficient documentation

## 2014-01-29 DIAGNOSIS — S199XXA Unspecified injury of neck, initial encounter: Secondary | ICD-10-CM

## 2014-01-29 DIAGNOSIS — W19XXXA Unspecified fall, initial encounter: Secondary | ICD-10-CM

## 2014-01-29 MED ORDER — ACETAMINOPHEN 325 MG PO TABS
650.0000 mg | ORAL_TABLET | Freq: Once | ORAL | Status: AC
  Administered 2014-01-30: 650 mg via ORAL
  Filled 2014-01-29: qty 2

## 2014-01-29 NOTE — ED Notes (Signed)
Patient reports falling while washing hair in sink this evening and hitting head on floor. Patient denies LOC, patient is A&O x4, patient rates pain on right side of head and right side of neck 5/10. Patient denies N/V, SOB, lightheadedness or dizziness.

## 2014-01-29 NOTE — ED Notes (Signed)
Patient was washing head in sink, fell and hit head on ground, denies LOC, patient has mild swelling to right side of head.

## 2014-01-29 NOTE — ED Notes (Signed)
Bed: [id number] Expected date:  Expected time:  Means of arrival:  Comments: EMS 73 yo F, fall, no LOC

## 2014-01-29 NOTE — ED Provider Notes (Signed)
CSN: [id number]     Arrival date & time 01/29/14  2310 History   First MD Initiated Contact with Patient 01/29/14 2319     Chief Complaint  Patient presents with  . Fall     (Consider location/radiation/quality/duration/timing/severity/associated sxs/prior Treatment) HPI  73 year old female with history of spinal stenosis, bipolar, thyroid disease who is brought in via EMS for evaluation of a fall.  Patient is living at an assistance facility. She used her walker to walk. She was trying to wash her hair on the sink, but in the process she lost her balance, fell on the right side striking the floor with her head. She denies any loss of consciousness but does complaining of 6/10 throbbing headache to the right side of the scalp and some neck discomfort. She also complaining of some mild tenderness to her left shoulder only. She denies any other injury. Denies any precipitating symptoms prior to the fall but admits that she has history of gait instability and has had recurrent falls with the last fall 2 months ago. She is not on any blood thinner medication. No complaint of chest pain, abdominal pain, or low back pain. No specific treatment tried. No confusion, or seizure activities.  Past Medical History  Diagnosis Date  . Bipolar 1 disorder   . Depression   . GERD (gastroesophageal reflux disease)   . Anxiety   . Thyroid disease     hypothyroid  . Spinal stenosis   . Genital herpes    Past Surgical History  Procedure Laterality Date  . Abdominal hysterectomy    . Hammer toe surgery    . Nasal sinus surgery     Family History  Problem Relation Age of Onset  . Heart disease Father   . Hyperlipidemia Father   . Cancer Sister   . Hyperlipidemia Sister   . Hyperlipidemia Brother   . Mental illness Brother    History  Substance Use Topics  . Smoking status: Never Smoker   . Smokeless tobacco: Never Used  . Alcohol Use: No   OB History   Grav Para Term Preterm Abortions TAB SAB  Ect Mult Living                 Review of Systems  Constitutional: Negative for fever.  Respiratory: Negative for shortness of breath.   Gastrointestinal: Negative for abdominal pain.  Musculoskeletal: Positive for arthralgias and neck pain.  Skin: Negative for rash and wound.  Neurological: Positive for headaches. Negative for light-headedness and numbness.  All other systems reviewed and are negative.     Allergies  No known allergies  Home Medications   Prior to Admission medications   Medication Sig Start Date End Date Taking? Authorizing Provider  aspirin 81 MG chewable tablet Chew 81 mg by mouth daily.    Historical Provider, MD  cholecalciferol (VITAMIN D) 1000 UNITS tablet Take 1,000 Units by mouth daily.    Historical Provider, MD  divalproex (DEPAKOTE) 500 MG DR tablet Take 1,000 mg by mouth at bedtime.    Historical Provider, MD  FLUoxetine (PROZAC) 20 MG tablet Take 20 mg by mouth daily.     Historical Provider, MD  lamoTRIgine (LAMICTAL) 150 MG tablet Take 150 mg by mouth daily.    Historical Provider, MD  levothyroxine (SYNTHROID, LEVOTHROID) 50 MCG tablet Take 50 mcg by mouth daily before breakfast.    Historical Provider, MD  meloxicam (MOBIC) 15 MG tablet Take 15 mg by mouth daily.    Historical  Provider, MD  oxyCODONE-acetaminophen (PERCOCET/ROXICET) 5-325 MG per tablet Take 1 tablet by mouth every 4 (four) hours as needed for moderate pain or severe pain.    Historical Provider, MD  oxyCODONE-acetaminophen (PERCOCET/ROXICET) 5-325 MG per tablet Take 1-2 tablets by mouth every 4 (four) hours as needed for moderate pain or severe pain. 01/05/14   Toy Baker, MD  pantoprazole (PROTONIX) 40 MG tablet Take 40 mg by mouth daily.    Historical Provider, MD  traMADol (ULTRAM) 50 MG tablet Take 50 mg by mouth at bedtime.    Historical Provider, MD   BP 120/69  Pulse 70  Temp(Src) 98.1 F (36.7 C) (Oral)  Resp 18  Ht 5\' 6"  (1.676 m)  Wt 190 lb (86.183 kg)  BMI  30.68 kg/m2  SpO2 100% Physical Exam  Nursing note and vitals reviewed. Constitutional: She is oriented to person, place, and time. She appears well-developed and well-nourished. No distress.  HENT:  Head: Normocephalic and atraumatic.  Tenderness to right parietal scalp region without any overlying skin changes, bruising, or crepitus. No hemotympanum, no septal hematoma, no malocclusion, no midface tenderness.  Eyes: Conjunctivae are normal.  Neck: Neck supple.  Right paracervical spine tenderness on palpation without any significant midline spine tenderness, crepitus, or step-off.  Cardiovascular: Normal rate and regular rhythm.   Pulmonary/Chest: Effort normal and breath sounds normal.  Abdominal: Soft. There is no tenderness.  Musculoskeletal: She exhibits tenderness (Right shoulder: anterior lateral aspects of shoulder is tender to palpation without any overlying skin changes, crepitus or bruising. Full range of motion. No right elbow or right wrist pain.).  No significant midline spine tenderness step-off.  Neurological: She is alert and oriented to person, place, and time.  Skin: No rash noted.  Psychiatric: She has a normal mood and affect.    ED Course  Procedures (including critical care time)  11:44 PM Patient here with mechanical fall. She does complain of dry scalp pain and neck pain along with right shoulder pain. CT scan and x-ray ordered. Pain medication given. She is not on blood thinner medication. Given the location of her pain, cervical spine collar applied for stability.  Will also check EKG and UA.    12:36 AM CTs scan and xray without acute fx/dislocation.  Pt refuse to be catherterized for UA, but given no urinary complaint, i will d/c order.  Pt able to ambulate with walker.  Stable for discharge back to assistance living facility.  Care discussed with Dr. Norlene Campbell.    12:43 AM Pt able to ambulate with a walker.  Stable to return to facility.    Labs  Review Labs Reviewed - No data to display  Imaging Review Dg Shoulder Right  01/30/2014   CLINICAL DATA:  fall  EXAM: RIGHT SHOULDER - 2+ VIEW  COMPARISON:  None.  FINDINGS: There is no evidence of fracture or dislocation. There is no evidence of arthropathy or other focal bone abnormality. Soft tissues are unremarkable.  IMPRESSION: Negative.   Electronically Signed   By: Rise Mu M.D.   On: 01/30/2014 00:12   Ct Head Wo Contrast  01/30/2014   CLINICAL DATA:  Fall.  EXAM: CT HEAD WITHOUT CONTRAST  CT CERVICAL SPINE WITHOUT CONTRAST  TECHNIQUE: Multidetector CT imaging of the head and cervical spine was performed following the standard protocol without intravenous contrast. Multiplanar CT image reconstructions of the cervical spine were also generated.  COMPARISON:  CT head and cervical spine 11/25/2013  FINDINGS: CT HEAD FINDINGS  Diffuse cerebral atrophy. Low-attenuation changes in the deep white matter consistent with small vessel ischemia. Ventricular dilatation consistent with central atrophy. No change since prior study. No mass effect or midline shift. No abnormal extra-axial fluid collections. Gray-white matter junctions are distinct. Basal cisterns are not effaced. No evidence of acute intracranial hemorrhage. No depressed skull fractures. Visualized paranasal sinuses and mastoid air cells are not opacified.  CT CERVICAL SPINE FINDINGS  There is about 4 mm anterior subluxation of C4 on C5, unchanged since previous study. Degenerative changes throughout the cervical spine with narrowed cervical interspaces and associated endplate hypertrophic changes. Prominent disc osteophyte complexes at C5-6 and C6-7 levels. Degenerative changes throughout the cervical facet joints. C1-2 articulation appears intact with prominent degenerative changes present. Slight rotation of C1 on C2 is probably positional. No vertebral compression deformities. No prevertebral soft tissue swelling. No focal bone  lesion or bone destruction. Degenerative changes noted in the temporomandibular joints, greater on the right.  IMPRESSION: No acute intracranial abnormalities. 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 No acute displaced fractures identified. Degenerative changes throughout the cervical spine.   Electronically Signed   By: Burman Nieves M.D.   On: 01/30/2014 00:19   Ct Cervical Spine Wo Contrast  01/30/2014   CLINICAL DATA:  Fall.  EXAM: CT HEAD WITHOUT CONTRAST  CT CERVICAL SPINE WITHOUT CONTRAST  TECHNIQUE: Multidetector CT imaging of the head and cervical spine was performed following the standard protocol without intravenous contrast. Multiplanar CT image reconstructions of the cervical spine were also generated.  COMPARISON:  CT head and cervical spine 11/25/2013  FINDINGS: CT HEAD FINDINGS  Diffuse cerebral atrophy. Low-attenuation changes in the deep white matter consistent with small vessel ischemia. Ventricular dilatation consistent with central atrophy. No change since prior study. No mass effect or midline shift. No abnormal extra-axial fluid collections. Gray-white matter junctions are distinct. Basal cisterns are not effaced. No evidence of acute intracranial hemorrhage. No depressed skull fractures. Visualized paranasal sinuses and mastoid air cells are not opacified.  CT CERVICAL SPINE FINDINGS  There is about 4 mm anterior subluxation of C4 on C5, unchanged since previous study. Degenerative changes throughout the cervical spine with narrowed cervical interspaces and associated endplate hypertrophic changes. Prominent disc osteophyte complexes at C5-6 and C6-7 levels. Degenerative changes throughout the cervical facet joints. C1-2 articulation appears intact with prominent degenerative changes present. Slight rotation of C1 on C2 is probably positional. No vertebral compression deformities. No prevertebral soft tissue swelling. No focal bone lesion or bone  destruction. Degenerative changes noted in the temporomandibular joints, greater on the right.  IMPRESSION: No acute intracranial abnormalities. 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 No acute displaced fractures identified. Degenerative changes throughout the cervical spine.   Electronically Signed   By: Burman Nieves M.D.   On: 01/30/2014 00:19     EKG Interpretation None      Date: 01/30/2014  Rate: 72  Rhythm: normal sinus rhythm  QRS Axis: normal  Intervals: normal  ST/T Wave abnormalities: normal  Conduction Disutrbances: none  Narrative Interpretation:   Old EKG Reviewed: No significant changes noted     MDM   Final diagnoses:  Fall at nursing home, initial encounter  Mild closed head injury, initial encounter    BP 120/69  Pulse 70  Temp(Src) 98.1 F (36.7 C) (Oral)  Resp 18  Ht 5\' 6"  (1.676 m)  Wt 190 lb (86.183 kg)  BMI  30.68 kg/m2  SpO2 100%  I have reviewed nursing notes and vital signs. I personally reviewed the imaging tests through PACS system  I reviewed available ER/hospitalization records thought the EMR    Fayrene Helper, PA-C 01/30/14 0044  Fayrene Helper, PA-C 01/30/14 [phone]

## 2014-01-30 ENCOUNTER — Other Ambulatory Visit: Payer: Self-pay

## 2014-01-30 DIAGNOSIS — S0990XA Unspecified injury of head, initial encounter: Secondary | ICD-10-CM | POA: Diagnosis not present

## 2014-01-30 MED ORDER — TRAMADOL HCL 50 MG PO TABS
50.0000 mg | ORAL_TABLET | Freq: Once | ORAL | Status: AC
  Administered 2014-01-30: 50 mg via ORAL
  Filled 2014-01-30: qty 1

## 2014-01-30 NOTE — ED Notes (Signed)
[facility] No answer at this [facility].

## 2014-01-30 NOTE — ED Provider Notes (Signed)
Medical screening examination/treatment/procedure(s) were performed by non-physician practitioner and as supervising physician I was immediately available for consultation/collaboration.    Vida RollerBrian D Jayana Kotula, MD 01/30/14 [phone]

## 2014-01-30 NOTE — ED Notes (Addendum)
Bedpan was not a success.  Discussed Catheter, Pt does not want.

## 2014-01-30 NOTE — ED Notes (Signed)
Pt was able to ambulate with a walker 

## 2014-01-30 NOTE — Discharge Instructions (Signed)
You have been evaluated for your recent fall.  No broken bones identified.  Please take your pain medication previously prescribed as needed for pain.  Follow instruction below to prevent future falls.  Return if you have any concerns.    Fall Prevention and Home Safety Falls cause injuries and can affect all age groups. It is possible to use preventive measures to significantly decrease the likelihood of falls. There are many simple measures which can make your home safer and prevent falls. OUTDOORS  Repair cracks and edges of walkways and driveways.  Remove high doorway thresholds.  Trim shrubbery on the main path into your home.  Have good outside lighting.  Clear walkways of tools, rocks, debris, and clutter.  Check that handrails are not broken and are securely fastened. Both sides of steps should have handrails.  Have leaves, snow, and ice cleared regularly.  Use sand or salt on walkways during winter months.  In the garage, clean up grease or oil spills. BATHROOM  Install night lights.  Install grab bars by the toilet and in the tub and shower.  Use non-skid mats or decals in the tub or shower.  Place a plastic non-slip stool in the shower to sit on, if needed.  Keep floors dry and clean up all water on the floor immediately.  Remove soap buildup in the tub or shower on a regular basis.  Secure bath mats with non-slip, double-sided rug tape.  Remove throw rugs and tripping hazards from the floors. BEDROOMS  Install night lights.  Make sure a bedside light is easy to reach.  Do not use oversized bedding.  Keep a telephone by your bedside.  Have a firm chair with side arms to use for getting dressed.  Remove throw rugs and tripping hazards from the floor. KITCHEN  Keep handles on pots and pans turned toward the center of the stove. Use back burners when possible.  Clean up spills quickly and allow time for drying.  Avoid walking on wet floors.  Avoid  hot utensils and knives.  Position shelves so they are not too high or low.  Place commonly used objects within easy reach.  If necessary, use a sturdy step stool with a grab bar when reaching.  Keep electrical cables out of the way.  Do not use floor polish or wax that makes floors slippery. If you must use wax, use non-skid floor wax.  Remove throw rugs and tripping hazards from the floor. STAIRWAYS  Never leave objects on stairs.  Place handrails on both sides of stairways and use them. Fix any loose handrails. Make sure handrails on both sides of the stairways are as long as the stairs.  Check carpeting to make sure it is firmly attached along stairs. Make repairs to worn or loose carpet promptly.  Avoid placing throw rugs at the top or bottom of stairways, or properly secure the rug with carpet tape to prevent slippage. Get rid of throw rugs, if possible.  Have an electrician put in a light switch at the top and bottom of the stairs. OTHER FALL PREVENTION TIPS  Wear low-heel or rubber-soled shoes that are supportive and fit well. Wear closed toe shoes.  When using a stepladder, make sure it is fully opened and both spreaders are firmly locked. Do not climb a closed stepladder.  Add color or contrast paint or tape to grab bars and handrails in your home. Place contrasting color strips on first and last steps.  Learn and use  mobility aids as needed. Install an electrical emergency response system. °· Turn on lights to avoid dark areas. Replace light bulbs that burn out immediately. Get light switches that glow. °· Arrange furniture to create clear pathways. Keep furniture in the same place. °· Firmly attach carpet with non-skid or double-sided tape. °· Eliminate uneven floor surfaces. °· Select a carpet pattern that does not visually hide the edge of steps. °· Be aware of all pets. °OTHER HOME SAFETY TIPS °· Set the water temperature for 120° F (48.8° C). °· Keep emergency numbers  on or near the telephone. °· Keep smoke detectors on every level of the home and near sleeping areas. °Document Released: 06/10/2002 Document Revised: 12/20/2011 Document Reviewed: 09/09/2011 °[employer]® Patient Information ©2015 [employer]. This information is not intended to replace advice given to you by your health care provider. Make sure you discuss any questions you have with your health care provider. ° °

## 2014-02-16 ENCOUNTER — Emergency Department (HOSPITAL_COMMUNITY): Payer: Medicare Other

## 2014-02-16 ENCOUNTER — Encounter (HOSPITAL_COMMUNITY): Payer: Self-pay | Admitting: Emergency Medicine

## 2014-02-16 ENCOUNTER — Emergency Department (HOSPITAL_COMMUNITY)
Admission: EM | Admit: 2014-02-16 | Discharge: 2014-02-16 | Disposition: A | Discharge status: Home / Self Care | Payer: Medicare Other | Attending: Emergency Medicine | Admitting: Emergency Medicine

## 2014-02-16 DIAGNOSIS — S93409A Sprain of unspecified ligament of unspecified ankle, initial encounter: Secondary | ICD-10-CM | POA: Diagnosis not present

## 2014-02-16 DIAGNOSIS — Z8619 Personal history of other infectious and parasitic diseases: Secondary | ICD-10-CM | POA: Diagnosis not present

## 2014-02-16 DIAGNOSIS — Z7982 Long term (current) use of aspirin: Secondary | ICD-10-CM | POA: Diagnosis not present

## 2014-02-16 DIAGNOSIS — S8990XA Unspecified injury of unspecified lower leg, initial encounter: Secondary | ICD-10-CM | POA: Insufficient documentation

## 2014-02-16 DIAGNOSIS — X500XXA Overexertion from strenuous movement or load, initial encounter: Secondary | ICD-10-CM | POA: Insufficient documentation

## 2014-02-16 DIAGNOSIS — S8000XA Contusion of unspecified knee, initial encounter: Secondary | ICD-10-CM | POA: Insufficient documentation

## 2014-02-16 DIAGNOSIS — Z791 Long term (current) use of non-steroidal anti-inflammatories (NSAID): Secondary | ICD-10-CM | POA: Diagnosis not present

## 2014-02-16 DIAGNOSIS — S99929A Unspecified injury of unspecified foot, initial encounter: Secondary | ICD-10-CM

## 2014-02-16 DIAGNOSIS — Z792 Long term (current) use of antibiotics: Secondary | ICD-10-CM | POA: Insufficient documentation

## 2014-02-16 DIAGNOSIS — N390 Urinary tract infection, site not specified: Secondary | ICD-10-CM | POA: Diagnosis not present

## 2014-02-16 DIAGNOSIS — K219 Gastro-esophageal reflux disease without esophagitis: Secondary | ICD-10-CM | POA: Diagnosis not present

## 2014-02-16 DIAGNOSIS — S99919A Unspecified injury of unspecified ankle, initial encounter: Secondary | ICD-10-CM

## 2014-02-16 DIAGNOSIS — Y9389 Activity, other specified: Secondary | ICD-10-CM | POA: Diagnosis not present

## 2014-02-16 DIAGNOSIS — Z8739 Personal history of other diseases of the musculoskeletal system and connective tissue: Secondary | ICD-10-CM | POA: Insufficient documentation

## 2014-02-16 DIAGNOSIS — Y929 Unspecified place or not applicable: Secondary | ICD-10-CM | POA: Insufficient documentation

## 2014-02-16 DIAGNOSIS — Z79899 Other long term (current) drug therapy: Secondary | ICD-10-CM | POA: Diagnosis not present

## 2014-02-16 DIAGNOSIS — F411 Generalized anxiety disorder: Secondary | ICD-10-CM | POA: Insufficient documentation

## 2014-02-16 DIAGNOSIS — F319 Bipolar disorder, unspecified: Secondary | ICD-10-CM | POA: Insufficient documentation

## 2014-02-16 DIAGNOSIS — W19XXXA Unspecified fall, initial encounter: Secondary | ICD-10-CM

## 2014-02-16 DIAGNOSIS — S93401A Sprain of unspecified ligament of right ankle, initial encounter: Secondary | ICD-10-CM

## 2014-02-16 DIAGNOSIS — S8001XA Contusion of right knee, initial encounter: Secondary | ICD-10-CM

## 2014-02-16 DIAGNOSIS — R296 Repeated falls: Secondary | ICD-10-CM | POA: Diagnosis not present

## 2014-02-16 LAB — CBC WITH DIFFERENTIAL/PLATELET
BASOS ABS: 0 10*3/uL (ref 0.0–0.1)
Basophils Relative: 0 % (ref 0–1)
EOS ABS: 0.4 10*3/uL (ref 0.0–0.7)
EOS PCT: 6 % — AB (ref 0–5)
HEMATOCRIT: 41.9 % (ref 36.0–46.0)
Hemoglobin: 13.9 g/dL (ref 12.0–15.0)
Lymphocytes Relative: 28 % (ref 12–46)
Lymphs Abs: 2 10*3/uL (ref 0.7–4.0)
MCH: 32.1 pg (ref 26.0–34.0)
MCHC: 33.2 g/dL (ref 30.0–36.0)
MCV: 96.8 fL (ref 78.0–100.0)
MONO ABS: 1 10*3/uL (ref 0.1–1.0)
Monocytes Relative: 14 % — ABNORMAL HIGH (ref 3–12)
Neutro Abs: 3.9 10*3/uL (ref 1.7–7.7)
Neutrophils Relative %: 52 % (ref 43–77)
PLATELETS: 166 10*3/uL (ref 150–400)
RBC: 4.33 MIL/uL (ref 3.87–5.11)
RDW: 13.8 % (ref 11.5–15.5)
WBC: 7.3 10*3/uL (ref 4.0–10.5)

## 2014-02-16 LAB — URINALYSIS, ROUTINE W REFLEX MICROSCOPIC
BILIRUBIN URINE: NEGATIVE
GLUCOSE, UA: NEGATIVE mg/dL
Ketones, ur: NEGATIVE mg/dL
Nitrite: POSITIVE — AB
PH: 6.5 (ref 5.0–8.0)
PROTEIN: NEGATIVE mg/dL
Specific Gravity, Urine: 1.026 (ref 1.005–1.030)
Urobilinogen, UA: 1 mg/dL (ref 0.0–1.0)

## 2014-02-16 LAB — BASIC METABOLIC PANEL
ANION GAP: 12 (ref 5–15)
BUN: 26 mg/dL — ABNORMAL HIGH (ref 6–23)
CALCIUM: 9.1 mg/dL (ref 8.4–10.5)
CO2: 27 mEq/L (ref 19–32)
Chloride: 104 mEq/L (ref 96–112)
Creatinine, Ser: 0.89 mg/dL (ref 0.50–1.10)
GFR calc Af Amer: 74 mL/min — ABNORMAL LOW (ref >90)
GFR, EST NON AFRICAN AMERICAN: 64 mL/min — AB (ref >90)
GLUCOSE: 124 mg/dL — AB (ref 70–99)
Potassium: 4.3 mEq/L (ref 3.7–5.3)
SODIUM: 143 meq/L (ref 137–147)

## 2014-02-16 LAB — URINE MICROSCOPIC-ADD ON

## 2014-02-16 LAB — VALPROIC ACID LEVEL: VALPROIC ACID LVL: 94.2 ug/mL (ref 50.0–100.0)

## 2014-02-16 MED ORDER — SODIUM CHLORIDE 0.9 % IV SOLN
INTRAVENOUS | Status: DC
  Administered 2014-02-16: 08:00:00 via INTRAVENOUS

## 2014-02-16 MED ORDER — SODIUM CHLORIDE 0.9 % IV BOLUS (SEPSIS)
500.0000 mL | Freq: Once | INTRAVENOUS | Status: AC
  Administered 2014-02-16: 500 mL via INTRAVENOUS

## 2014-02-16 MED ORDER — IBUPROFEN 400 MG PO TABS: 400.0000 mg | ORAL_TABLET | Freq: Four times a day (QID) | ORAL | Status: DC | PRN

## 2014-02-16 MED ORDER — CEPHALEXIN 500 MG PO CAPS: 500.0000 mg | ORAL_CAPSULE | Freq: Four times a day (QID) | ORAL | Status: DC

## 2014-02-16 MED ORDER — CEPHALEXIN 250 MG PO CAPS
250.0000 mg | ORAL_CAPSULE | Freq: Once | ORAL | Status: AC
  Administered 2014-02-16: 250 mg via ORAL
  Filled 2014-02-16: qty 1

## 2014-02-16 NOTE — ED Notes (Signed)
Bed: [id number] Expected date:  Expected time:  Means of arrival:  Comments: ems ankle pain after fall at [facility]

## 2014-02-16 NOTE — Discharge Instructions (Signed)
Ankle Sprain °An ankle sprain is an injury to the strong, fibrous tissues (ligaments) that hold the bones of your ankle joint together.  °CAUSES °An ankle sprain is usually caused by a fall or by twisting your ankle. Ankle sprains most commonly occur when you step on the outer edge of your foot, and your ankle turns inward. People who participate in sports are more prone to these types of injuries.  °SYMPTOMS  °· Pain in your ankle. The pain may be present at rest or only when you are trying to stand or walk. °· Swelling. °· Bruising. Bruising may develop immediately or within 1 to 2 days after your injury. °· Difficulty standing or walking, particularly when turning corners or changing directions. °DIAGNOSIS  °Your caregiver will ask you details about your injury and perform a physical exam of your ankle to determine if you have an ankle sprain. During the physical exam, your caregiver will press on and apply pressure to specific areas of your foot and ankle. Your caregiver will try to move your ankle in certain ways. An X-ray exam may be done to be sure a bone was not broken or a ligament did not separate from one of the bones in your ankle (avulsion fracture).  °TREATMENT  °Certain types of braces can help stabilize your ankle. Your caregiver can make a recommendation for this. Your caregiver may recommend the use of medicine for pain. If your sprain is severe, your caregiver may refer you to a surgeon who helps to restore function to parts of your skeletal system (orthopedist) or a physical therapist. °HOME CARE INSTRUCTIONS  °· Apply ice to your injury for 1-2 days or as directed by your caregiver. Applying ice helps to reduce inflammation and pain. °¨ Put ice in a plastic bag. °¨ Place a towel between your skin and the bag. °¨ Leave the ice on for 15-20 minutes at a time, every 2 hours while you are awake. °· Only take over-the-counter or prescription medicines for pain, discomfort, or fever as directed by  your caregiver. °· Elevate your injured ankle above the level of your heart as much as possible for 2-3 days. °· If your caregiver recommends crutches, use them as instructed. Gradually put weight on the affected ankle. Continue to use crutches or a cane until you can walk without feeling pain in your ankle. °· If you have a plaster splint, wear the splint as directed by your caregiver. Do not rest it on anything harder than a pillow for the first 24 hours. Do not put weight on it. Do not get it wet. You may take it off to take a shower or bath. °· You may have been given an elastic bandage to wear around your ankle to provide support. If the elastic bandage is too tight (you have numbness or tingling in your foot or your foot becomes cold and blue), adjust the bandage to make it comfortable. °· If you have an air splint, you may blow more air into it or let air out to make it more comfortable. You may take your splint off at night and before taking a shower or bath. Wiggle your toes in the splint several times per day to decrease swelling. °SEEK MEDICAL CARE IF:  °· You have rapidly increasing bruising or swelling. °· Your toes feel extremely cold or you lose feeling in your foot. °· Your pain is not relieved with medicine. °SEEK IMMEDIATE MEDICAL CARE IF: °· Your toes are numb or blue. °·   You have severe pain that is increasing. MAKE SURE YOU:   Understand these instructions.  Will watch your condition.  Will get help right away if you are not doing well or get worse. Document Released: 06/20/2005 Document Revised: 03/14/2012 Document Reviewed: 07/02/2011 [facility] Patient Information 2015 [place], [place]. This information is not intended to replace advice given to you by your health care provider. Make sure you discuss any questions you have with your health care provider.  Contusion A contusion is a deep bruise. Contusions happen when an injury causes bleeding under the skin. Signs of bruising include  pain, puffiness (swelling), and discolored skin. The contusion may turn blue, purple, or yellow. HOME CARE   Put ice on the injured area.  Put ice in a plastic bag.  Place a towel between your skin and the bag.  Leave the ice on for 15-20 minutes, 03-04 times a day.  Only take medicine as told by your doctor.  Rest the injured area.  If possible, raise (elevate) the injured area to lessen puffiness. GET HELP RIGHT AWAY IF:   You have more bruising or puffiness.  You have pain that is getting worse.  Your puffiness or pain is not helped by medicine. MAKE SURE YOU:   Understand these instructions.  Will watch your condition.  Will get help right away if you are not doing well or get worse. Document Released: 12/07/2007 Document Revised: 09/12/2011 Document Reviewed: 04/25/2011 [facility] Patient Information 2015 [place], [place]. This information is not intended to replace advice given to you by your health care provider. Make sure you discuss any questions you have with your health care provider.  Fall Prevention and Home Safety Falls cause injuries and can affect all age groups. It is possible to use preventive measures to significantly decrease the likelihood of falls. There are many simple measures which can make your home safer and prevent falls. OUTDOORS  Repair cracks and edges of walkways and driveways.  Remove high doorway thresholds.  Trim shrubbery on the main path into your home.  Have good outside lighting.  Clear walkways of tools, rocks, debris, and clutter.  Check that handrails are not broken and are securely fastened. Both sides of steps should have handrails.  Have leaves, snow, and ice cleared regularly.  Use sand or salt on walkways during winter months.  In the garage, clean up grease or oil spills. BATHROOM  Install night lights.  Install grab bars by the toilet and in the tub and shower.  Use non-skid mats or decals in the tub or  shower.  Place a plastic non-slip stool in the shower to sit on, if needed.  Keep floors dry and clean up all water on the floor immediately.  Remove soap buildup in the tub or shower on a regular basis.  Secure bath mats with non-slip, double-sided rug tape.  Remove throw rugs and tripping hazards from the floors. BEDROOMS  Install night lights.  Make sure a bedside light is easy to reach.  Do not use oversized bedding.  Keep a telephone by your bedside.  Have a firm chair with side arms to use for getting dressed.  Remove throw rugs and tripping hazards from the floor. KITCHEN  Keep handles on pots and pans turned toward the center of the stove. Use back burners when possible.  Clean up spills quickly and allow time for drying.  Avoid walking on wet floors.  Avoid hot utensils and knives.  Position shelves so they are not  too high or low.  Place commonly used objects within easy reach.  If necessary, use a sturdy step stool with a grab bar when reaching.  Keep electrical cables out of the way.  Do not use floor polish or wax that makes floors slippery. If you must use wax, use non-skid floor wax.  Remove throw rugs and tripping hazards from the floor. STAIRWAYS  Never leave objects on stairs.  Place handrails on both sides of stairways and use them. Fix any loose handrails. Make sure handrails on both sides of the stairways are as long as the stairs.  Check carpeting to make sure it is firmly attached along stairs. Make repairs to worn or loose carpet promptly.  Avoid placing throw rugs at the top or bottom of stairways, or properly secure the rug with carpet tape to prevent slippage. Get rid of throw rugs, if possible.  Have an electrician put in a light switch at the top and bottom of the stairs. OTHER FALL PREVENTION TIPS  Wear low-heel or rubber-soled shoes that are supportive and fit well. Wear closed toe shoes.  When using a stepladder, make sure it  is fully opened and both spreaders are firmly locked. Do not climb a closed stepladder.  Add color or contrast paint or tape to grab bars and handrails in your home. Place contrasting color strips on first and last steps.  Learn and use mobility aids as needed. Install an electrical emergency response system.  Turn on lights to avoid dark areas. Replace light bulbs that burn out immediately. Get light switches that glow.  Arrange furniture to create clear pathways. Keep furniture in the same place.  Firmly attach carpet with non-skid or double-sided tape.  Eliminate uneven floor surfaces.  Select a carpet pattern that does not visually hide the edge of steps.  Be aware of all pets. OTHER HOME SAFETY TIPS  Set the water temperature for 120 F (48.8 C).  Keep emergency numbers on or near the telephone.  Keep smoke detectors on every level of the home and near sleeping areas. Document Released: 06/10/2002 Document Revised: 12/20/2011 Document Reviewed: 09/09/2011 [facility] Patient Information 2015 [place]. This information is not intended to replace advice given to you by your health care provider. Make sure you discuss any questions you have with your health care provider.  Urinary Tract Infection A urinary tract infection (UTI) can occur any place along the urinary tract. The tract includes the kidneys, ureters, bladder, and urethra. A type of germ called bacteria often causes a UTI. UTIs are often helped with antibiotic medicine.  HOME CARE   If given, take antibiotics as told by your doctor. Finish them even if you start to feel better.  Drink enough fluids to keep your pee (urine) clear or pale yellow.  Avoid tea, drinks with caffeine, and bubbly (carbonated) drinks.  Pee often. Avoid holding your pee in for a long time.  Pee before and after having sex (intercourse).  Wipe from front to back after you poop (bowel movement) if you are a woman. Use each tissue only  once. GET HELP RIGHT AWAY IF:   You have back pain.  You have lower belly (abdominal) pain.  You have chills.  You feel sick to your stomach (nauseous).  You throw up (vomit).  Your burning or discomfort with peeing does not go away.  You have a fever.  Your symptoms are not better in 3 days. MAKE SURE YOU:   Understand these instructions.  Will watch your condition.  Will get help right away if you are not doing well or get worse. Document Released: 12/07/2007 Document Revised: 03/14/2012 Document Reviewed: 01/19/2012 [facility] Patient Information 2015 [place], [place]. This information is not intended to replace advice given to you by your health care provider. Make sure you discuss any questions you have with your health care provider.

## 2014-02-16 NOTE — ED Notes (Signed)
[facility]  Witnessed fall at around 0630 this morning, denied LOC, claimed of right ankle pain at 10/10, no swelling nor obvious deformity reported per EMS, pt. Is alert and oriented x3.

## 2014-02-16 NOTE — ED Provider Notes (Signed)
CSN: [id number]     Arrival date & time 02/16/14  7829 History   First MD Initiated Contact with Patient 02/16/14 [phone]     Chief Complaint  Patient presents with  . Ankle Pain  . Fall     (Consider location/radiation/quality/duration/timing/severity/associated sxs/prior Treatment) HPI  Pam Jackson is a 73 y.o. female presents for evaluation of injury and fall. She reports that she twisted her ankle and fell. She injured her right ankle, right knee, and right hip, in the fall. She is vague about what happened after the fall. She denies a prodrome, before the fall. She lives in a retirement facility, where there is some help. It is unclear how she was able to summon help, and arrived here. She was in the emergency department about 2 weeks ago after another fall. She did not complete her workup at that time, by her choice. She is a poor historian. She denies other problems at this time.  Level V caveat- poor historian   Past Medical History  Diagnosis Date  . Bipolar 1 disorder   . Depression   . GERD (gastroesophageal reflux disease)   . Anxiety   . Thyroid disease     hypothyroid  . Spinal stenosis   . Genital herpes    Past Surgical History  Procedure Laterality Date  . Abdominal hysterectomy    . Hammer toe surgery    . Nasal sinus surgery     Family History  Problem Relation Age of Onset  . Heart disease Father   . Hyperlipidemia Father   . Cancer Sister   . Hyperlipidemia Sister   . Hyperlipidemia Brother   . Mental illness Brother    History  Substance Use Topics  . Smoking status: Never Smoker   . Smokeless tobacco: Never Used  . Alcohol Use: No   OB History   Grav Para Term Preterm Abortions TAB SAB Ect Mult Living                 Review of Systems  All other systems reviewed and are negative.     Allergies  No known allergies  Home Medications   Prior to Admission medications   Medication Sig Start Date End Date Taking? Authorizing  Provider  acetaminophen (TYLENOL) 500 MG tablet Take 500 mg by mouth every 6 (six) hours as needed (for pain).   Yes Historical Provider, MD  aspirin 81 MG chewable tablet Chew 81 mg by mouth daily.   Yes Historical Provider, MD  cholecalciferol (VITAMIN D) 1000 UNITS tablet Take 1,000 Units by mouth daily.   Yes Historical Provider, MD  divalproex (DEPAKOTE) 500 MG DR tablet Take 1,000 mg by mouth at bedtime.   Yes Historical Provider, MD  FLUoxetine (PROZAC) 20 MG tablet Take 20 mg by mouth daily.    Yes Historical Provider, MD  lamoTRIgine (LAMICTAL) 150 MG tablet Take 150 mg by mouth daily.   Yes Historical Provider, MD  levothyroxine (SYNTHROID, LEVOTHROID) 50 MCG tablet Take 50 mcg by mouth daily before breakfast.   Yes Historical Provider, MD  meloxicam (MOBIC) 15 MG tablet Take 15 mg by mouth daily.   Yes Historical Provider, MD  ondansetron (ZOFRAN-ODT) 4 MG disintegrating tablet Take 4 mg by mouth every 8 (eight) hours as needed for nausea or vomiting.   Yes Historical Provider, MD  oxyCODONE-acetaminophen (PERCOCET/ROXICET) 5-325 MG per tablet Take 1-2 tablets by mouth every 4 (four) hours as needed for moderate pain or severe pain.  Yes Historical Provider, MD  pantoprazole (PROTONIX) 40 MG tablet Take 40 mg by mouth daily.   Yes Historical Provider, MD  traMADol (ULTRAM) 50 MG tablet Take 50 mg by mouth at bedtime.   Yes Historical Provider, MD  cephALEXin (KEFLEX) 500 MG capsule Take 1 capsule (500 mg total) by mouth 4 (four) times daily. 02/16/14   Flint Melter, MD  ibuprofen (ADVIL,MOTRIN) 400 MG tablet Take 1 tablet (400 mg total) by mouth every 6 (six) hours as needed for moderate pain. 02/16/14   Flint Melter, MD   BP 114/49  Pulse 72  Temp(Src) 97.8 F (36.6 C) (Oral)  Resp 16  SpO2 96% Physical Exam  Nursing note and vitals reviewed. Constitutional: She is oriented to person, place, and time. She appears well-developed.  Elderly, frail  HENT:  Head: Normocephalic  and atraumatic.  No contusion, abrasion or laceration of the scalp.  Eyes: Conjunctivae and EOM are normal. Pupils are equal, round, and reactive to light.  Neck: Normal range of motion and phonation normal. Neck supple.  Cardiovascular: Normal rate, regular rhythm and intact distal pulses.   Pulmonary/Chest: Effort normal and breath sounds normal. She exhibits tenderness (mild left lateral chest wall tenderness without crepitation or deformity).  Abdominal: Soft. She exhibits no distension. There is no tenderness. There is no guarding.  Musculoskeletal: Normal range of motion.  No tenderness of the cervical, thoracic, or lumbar spine. Is able to sit in a stretcher, without pain in the neck or back. Right knee has small contusion anteriorly and mild pain with movement. Right ankle is tender and swollen laterally. There is no instability of the right knee or ankle. Right hip has normal range of motion, without deformity, pain or tenderness.  Neurological: She is alert and oriented to person, place, and time. She exhibits normal muscle tone.  Skin: Skin is warm and dry.  Psychiatric: She has a normal mood and affect. Her behavior is normal. Judgment and thought content normal.    ED Course  Procedures (including critical care time) Medications  sodium chloride 0.9 % bolus 500 mL (0 mLs Intravenous Stopped 02/16/14 1253)  cephALEXin (KEFLEX) capsule 250 mg (250 mg Oral Given 02/16/14 1303)    No data found.    Labs Review Labs Reviewed  CBC WITH DIFFERENTIAL - Abnormal; Notable for the following:    Monocytes Relative 14 (*)    Eosinophils Relative 6 (*)    All other components within normal limits  BASIC METABOLIC PANEL - Abnormal; Notable for the following:    Glucose, Bld 124 (*)    BUN 26 (*)    GFR calc non Af Amer 64 (*)    GFR calc Af Amer 74 (*)    All other components within normal limits  URINALYSIS, ROUTINE W REFLEX MICROSCOPIC - Abnormal; Notable for the following:     APPearance CLOUDY (*)    Hgb urine dipstick SMALL (*)    Nitrite POSITIVE (*)    Leukocytes, UA MODERATE (*)    All other components within normal limits  URINE MICROSCOPIC-ADD ON - Abnormal; Notable for the following:    Bacteria, UA MANY (*)    All other components within normal limits  URINE CULTURE  VALPROIC ACID LEVEL    Imaging Review No results found.   EKG Interpretation None      MDM   Final diagnoses:  Fall, initial encounter  Ankle sprain, right, initial encounter  Contusion, knee, right, initial encounter  Urinary tract  infection without hematuria, site unspecified    Fall due to urinary tract infection. No serious injuries. She is stable for discharge.  Nursing Notes Reviewed/ Care Coordinated Applicable Imaging Reviewed Interpretation of Laboratory Data incorporated into ED treatment  The patient appears reasonably screened and/or stabilized for discharge and I doubt any other medical condition or other [facility] requiring further screening, evaluation, or treatment in the ED at this time prior to discharge.  Plan: Home Medications- Keflex; Home Treatments- rest; return here if the recommended treatment, does not improve the symptoms; Recommended follow up- PCP 1 week    Flint MelterElliott L Achille Xiang, MD 02/19/14 [phone]

## 2014-02-18 LAB — URINE CULTURE: Colony Count: >=100000

## 2014-02-19 ENCOUNTER — Other Ambulatory Visit: Payer: Medicare Other

## 2014-02-19 NOTE — ED Notes (Signed)
Reviewed by Tennis Mustassie Stewart, RPH.  (+)urine culture, currently on Cefazolin

## 2014-03-01 ENCOUNTER — Emergency Department (HOSPITAL_COMMUNITY)
Admission: EM | Admit: 2014-03-01 | Discharge: 2014-03-01 | Disposition: A | Discharge status: Home / Self Care | Payer: Medicare Other | Attending: Emergency Medicine | Admitting: Emergency Medicine

## 2014-03-01 ENCOUNTER — Encounter (HOSPITAL_COMMUNITY): Payer: Self-pay | Admitting: Emergency Medicine

## 2014-03-01 DIAGNOSIS — F319 Bipolar disorder, unspecified: Secondary | ICD-10-CM | POA: Insufficient documentation

## 2014-03-01 DIAGNOSIS — K5900 Constipation, unspecified: Secondary | ICD-10-CM | POA: Insufficient documentation

## 2014-03-01 DIAGNOSIS — F329 Major depressive disorder, single episode, unspecified: Secondary | ICD-10-CM | POA: Diagnosis not present

## 2014-03-01 DIAGNOSIS — F411 Generalized anxiety disorder: Secondary | ICD-10-CM | POA: Insufficient documentation

## 2014-03-01 DIAGNOSIS — K219 Gastro-esophageal reflux disease without esophagitis: Secondary | ICD-10-CM | POA: Insufficient documentation

## 2014-03-01 DIAGNOSIS — Z79899 Other long term (current) drug therapy: Secondary | ICD-10-CM | POA: Insufficient documentation

## 2014-03-01 DIAGNOSIS — Z792 Long term (current) use of antibiotics: Secondary | ICD-10-CM | POA: Diagnosis not present

## 2014-03-01 DIAGNOSIS — N390 Urinary tract infection, site not specified: Secondary | ICD-10-CM

## 2014-03-01 DIAGNOSIS — Z7982 Long term (current) use of aspirin: Secondary | ICD-10-CM | POA: Insufficient documentation

## 2014-03-01 DIAGNOSIS — Z791 Long term (current) use of non-steroidal anti-inflammatories (NSAID): Secondary | ICD-10-CM | POA: Insufficient documentation

## 2014-03-01 DIAGNOSIS — F3289 Other specified depressive episodes: Secondary | ICD-10-CM | POA: Diagnosis not present

## 2014-03-01 DIAGNOSIS — Z8739 Personal history of other diseases of the musculoskeletal system and connective tissue: Secondary | ICD-10-CM | POA: Diagnosis not present

## 2014-03-01 DIAGNOSIS — Z8619 Personal history of other infectious and parasitic diseases: Secondary | ICD-10-CM | POA: Diagnosis not present

## 2014-03-01 DIAGNOSIS — Z90710 Acquired absence of both cervix and uterus: Secondary | ICD-10-CM | POA: Diagnosis not present

## 2014-03-01 DIAGNOSIS — R300 Dysuria: Secondary | ICD-10-CM

## 2014-03-01 LAB — URINALYSIS, ROUTINE W REFLEX MICROSCOPIC
Bilirubin Urine: NEGATIVE
GLUCOSE, UA: NEGATIVE mg/dL
Ketones, ur: NEGATIVE mg/dL
Nitrite: NEGATIVE
PH: 8 (ref 5.0–8.0)
Protein, ur: NEGATIVE mg/dL
Specific Gravity, Urine: 1.019 (ref 1.005–1.030)
Urobilinogen, UA: 1 mg/dL (ref 0.0–1.0)

## 2014-03-01 LAB — URINE MICROSCOPIC-ADD ON

## 2014-03-01 MED ORDER — FLUCONAZOLE 150 MG PO TABS
150.0000 mg | ORAL_TABLET | Freq: Once | ORAL | Status: AC
  Administered 2014-03-01: 150 mg via ORAL
  Filled 2014-03-01: qty 1

## 2014-03-01 MED ORDER — CIPROFLOXACIN HCL 500 MG PO TABS
500.0000 mg | ORAL_TABLET | Freq: Once | ORAL | Status: AC
  Administered 2014-03-01: 500 mg via ORAL
  Filled 2014-03-01: qty 1

## 2014-03-01 MED ORDER — DOCUSATE SODIUM 100 MG PO CAPS: 100.0000 mg | ORAL_CAPSULE | Freq: Two times a day (BID) | ORAL | Status: DC

## 2014-03-01 MED ORDER — CIPROFLOXACIN HCL 500 MG PO TABS: 500.0000 mg | ORAL_TABLET | Freq: Two times a day (BID) | ORAL | Status: DC

## 2014-03-01 MED ORDER — POLYETHYLENE GLYCOL 3350 17 G PO PACK: 17.0000 g | PACK | Freq: Every day | ORAL | Status: DC | PRN

## 2014-03-01 NOTE — ED Notes (Signed)
Report received from [facility] pt has refused in and out cath,  Pt used bedpan but it had stool in it.

## 2014-03-01 NOTE — ED Provider Notes (Signed)
CSN: [id number]     Arrival date & time 03/01/14  1711 History   First MD Initiated Contact with Patient 03/01/14 1750     Chief Complaint  Patient presents with  . Dysuria  . Constipation     (Consider location/radiation/quality/duration/timing/severity/associated sxs/prior Treatment) Patient is a 73 y.o. female presenting with dysuria and constipation. The history is provided by the patient.  Dysuria Associated symptoms: no abdominal pain, no fever, no flank pain and no vomiting   Constipation Associated symptoms: dysuria   Associated symptoms: no abdominal pain, no back pain, no diarrhea, no fever and no vomiting   pt c/o dysuria for the past 1-2 weeks. States was started on abx by pcp, pt unsure how many days on abx. No abdominal pain. No back or flank pain. No nv. No fever or chills. Also notes long standing intermittent constipation, and last bm approx 3-4 days ago. No abd distension. No fever or chills. No recent change in meds, x abx for suspected uti.     Past Medical History  Diagnosis Date  . Bipolar 1 disorder   . Depression   . GERD (gastroesophageal reflux disease)   . Anxiety   . Thyroid disease     hypothyroid  . Spinal stenosis   . Genital herpes    Past Surgical History  Procedure Laterality Date  . Abdominal hysterectomy    . Hammer toe surgery    . Nasal sinus surgery     Family History  Problem Relation Age of Onset  . Heart disease Father   . Hyperlipidemia Father   . Cancer Sister   . Hyperlipidemia Sister   . Hyperlipidemia Brother   . Mental illness Brother    History  Substance Use Topics  . Smoking status: Never Smoker   . Smokeless tobacco: Never Used  . Alcohol Use: No   OB History   Grav Para Term Preterm Abortions TAB SAB Ect Mult Living                 Review of Systems  Constitutional: Negative for fever and chills.  HENT: Negative for sore throat.   Eyes: Negative for redness.  Respiratory: Negative for shortness of  breath.   Cardiovascular: Negative for chest pain.  Gastrointestinal: Positive for constipation. Negative for vomiting, abdominal pain and diarrhea.  Genitourinary: Positive for dysuria. Negative for flank pain.  Musculoskeletal: Negative for back pain and neck pain.  Skin: Negative for rash.  Neurological: Negative for headaches.  Hematological: Does not bruise/bleed easily.  Psychiatric/Behavioral: Negative for confusion.      Allergies  No known allergies  Home Medications   Prior to Admission medications   Medication Sig Start Date End Date Taking? Authorizing Provider  acetaminophen (TYLENOL) 500 MG tablet Take 500 mg by mouth every 6 (six) hours as needed (for pain).   Yes Historical Provider, MD  aspirin 81 MG chewable tablet Chew 81 mg by mouth daily.   Yes Historical Provider, MD  cholecalciferol (VITAMIN D) 1000 UNITS tablet Take 1,000 Units by mouth daily.   Yes Historical Provider, MD  divalproex (DEPAKOTE) 500 MG DR tablet Take 1,000 mg by mouth at bedtime.   Yes Historical Provider, MD  FLUoxetine (PROZAC) 20 MG tablet Take 20 mg by mouth daily.    Yes Historical Provider, MD  ibuprofen (ADVIL,MOTRIN) 400 MG tablet Take 1 tablet (400 mg total) by mouth every 6 (six) hours as needed for moderate pain. 02/16/14  Yes Flint Melter, MD  lamoTRIgine (LAMICTAL) 150 MG tablet Take 150 mg by mouth daily.   Yes Historical Provider, MD  levothyroxine (SYNTHROID, LEVOTHROID) 50 MCG tablet Take 50 mcg by mouth daily before breakfast.   Yes Historical Provider, MD  meloxicam (MOBIC) 15 MG tablet Take 15 mg by mouth daily.   Yes Historical Provider, MD  ondansetron (ZOFRAN-ODT) 4 MG disintegrating tablet Take 4 mg by mouth every 8 (eight) hours as needed for nausea or vomiting.   Yes Historical Provider, MD  oxyCODONE-acetaminophen (PERCOCET/ROXICET) 5-325 MG per tablet Take 1-2 tablets by mouth every 4 (four) hours as needed for moderate pain or severe pain.   Yes Historical Provider,  MD  pantoprazole (PROTONIX) 40 MG tablet Take 40 mg by mouth daily.   Yes Historical Provider, MD  traMADol (ULTRAM) 50 MG tablet Take 50 mg by mouth at bedtime.   Yes Historical Provider, MD  cephALEXin (KEFLEX) 500 MG capsule Take 1 capsule (500 mg total) by mouth 4 (four) times daily. 02/16/14   Flint Melter, MD   BP 119/52  Pulse 71  Temp(Src) 98.3 F (36.8 C) (Oral)  Resp 16  SpO2 94% Physical Exam  Nursing note and vitals reviewed. Constitutional: She appears well-developed and well-nourished. No distress.  HENT:  Mouth/Throat: Oropharynx is clear and moist.  Eyes: Conjunctivae are normal. Pupils are equal, round, and reactive to light. No scleral icterus.  Neck: Neck supple. No tracheal deviation present.  Cardiovascular: Normal rate, regular rhythm, normal heart sounds and intact distal pulses.   Pulmonary/Chest: Effort normal and breath sounds normal. No respiratory distress.  Abdominal: Soft. Normal appearance and bowel sounds are normal. She exhibits no distension and no mass. There is no tenderness. There is no rebound and no guarding.  Genitourinary:  No cva tenderness. Pt w bm on stretcher, soft formed stool, heme neg. No impaction.   Musculoskeletal: She exhibits no edema.  Neurological: She is alert.  Skin: Skin is warm and dry. No rash noted. She is not diaphoretic.  Psychiatric: She has a normal mood and affect.    ED Course  Procedures (including critical care time) Labs Review  Results for orders placed during the hospital encounter of 03/01/14  URINALYSIS, ROUTINE W REFLEX MICROSCOPIC      Result Value Ref Range   Color, Urine YELLOW  YELLOW   APPearance CLOUDY (*) CLEAR   Specific Gravity, Urine 1.019  1.005 - 1.030   pH 8.0  5.0 - 8.0   Glucose, UA NEGATIVE  NEGATIVE mg/dL   Hgb urine dipstick MODERATE (*) NEGATIVE   Bilirubin Urine NEGATIVE  NEGATIVE   Ketones, ur NEGATIVE  NEGATIVE mg/dL   Protein, ur NEGATIVE  NEGATIVE mg/dL   Urobilinogen, UA  1.0  0.0 - 1.0 mg/dL   Nitrite NEGATIVE  NEGATIVE   Leukocytes, UA MODERATE (*) NEGATIVE  URINE MICROSCOPIC-ADD ON      Result Value Ref Range   WBC, UA 3-6  <3 WBC/hpf   RBC / HPF 0-2  <3 RBC/hpf   Bacteria, UA FEW (*) RARE   Urine-Other AMORPHOUS URATES/PHOSPHATES       MDM   UA.  Reviewed nursing notes and prior charts for additional history.   Mild periurethral irritation, minimal d/c c/w yeast.  Diflucan po.  On cath ua, mod le, few wbc, and persistent symptoms. Recent cx reviewed, klebsiella, pan sens x amp, as symptoms persist, will recx urine and rx cipr for the next few days until cx returns.   Pt appears stable  for d/c.     Suzi Roots, MD 03/01/14 2041

## 2014-03-01 NOTE — ED Notes (Signed)
Robert and I went in the room to get a urine sample. The patient wanted to try to urinate on her own first and could not. Then I explained to her that I would do an In and out and proceeded to do it. When I tried to get the urine the patient flipped out and said that I was not going to get urine from her.

## 2014-03-01 NOTE — Discharge Instructions (Signed)
Drink plenty of fluids. Take cipro (antibiotic) as prescribed. A urine culture was sent the results of which will be back in 2-3 days time - have your doctor follow up on that result then. For constipation, drink plenty of fluids, get adequate fiber in diet. You may take colace (stool softener) as need, and take miralax (laxative) as need - both are available over the counter. Follow up with primary care doctor in coming week. Return to ER if worse, new symptoms, persistent vomiting, fevers, abdominal pain, other concern.   Urinary Tract Infection Urinary tract infections (UTIs) can develop anywhere along your urinary tract. Your urinary tract is your body's drainage system for removing wastes and extra water. Your urinary tract includes two kidneys, two ureters, a bladder, and a urethra. Your kidneys are a pair of bean-shaped organs. Each kidney is about the size of your fist. They are located below your ribs, one on each side of your spine. CAUSES Infections are caused by microbes, which are microscopic organisms, including fungi, viruses, and bacteria. These organisms are so small that they can only be seen through a microscope. Bacteria are the microbes that most commonly cause UTIs. SYMPTOMS  Symptoms of UTIs may vary by age and gender of the patient and by the location of the infection. Symptoms in young women typically include a frequent and intense urge to urinate and a painful, burning feeling in the bladder or urethra during urination. Older women and men are more likely to be tired, shaky, and weak and have muscle aches and abdominal pain. A fever may mean the infection is in your kidneys. Other symptoms of a kidney infection include pain in your back or sides below the ribs, nausea, and vomiting. DIAGNOSIS To diagnose a UTI, your caregiver will ask you about your symptoms. Your caregiver also will ask to provide a urine sample. The urine sample will be tested for bacteria and white blood  cells. White blood cells are made by your body to help fight infection. TREATMENT  Typically, UTIs can be treated with medication. Because most UTIs are caused by a bacterial infection, they usually can be treated with the use of antibiotics. The choice of antibiotic and length of treatment depend on your symptoms and the type of bacteria causing your infection. HOME CARE INSTRUCTIONS  If you were prescribed antibiotics, take them exactly as your caregiver instructs you. Finish the medication even if you feel better after you have only taken some of the medication.  Drink enough water and fluids to keep your urine clear or pale yellow.  Avoid caffeine, tea, and carbonated beverages. They tend to irritate your bladder.  Empty your bladder often. Avoid holding urine for long periods of time.  Empty your bladder before and after sexual intercourse.  After a bowel movement, women should cleanse from front to back. Use each tissue only once. SEEK MEDICAL CARE IF:   You have back pain.  You develop a fever.  Your symptoms do not begin to resolve within 3 days. SEEK IMMEDIATE MEDICAL CARE IF:   You have severe back pain or lower abdominal pain.  You develop chills.  You have nausea or vomiting.  You have continued burning or discomfort with urination. MAKE SURE YOU:   Understand these instructions.  Will watch your condition.  Will get help right away if you are not doing well or get worse. Document Released: 03/30/2005 Document Revised: 12/20/2011 Document Reviewed: 07/29/2011 [facility] Patient Information 2015 [place], [place]. This information is not  intended to replace advice given to you by your health care provider. Make sure you discuss any questions you have with your health care provider.    Constipation Constipation is when a person has fewer than three bowel movements a week, has difficulty having a bowel movement, or has stools that are dry, hard, or larger than  normal. As people grow older, constipation is more common. If you try to fix constipation with medicines that make you have a bowel movement (laxatives), the problem may get worse. Long-term laxative use may cause the muscles of the colon to become weak. A low-fiber diet, not taking in enough fluids, and taking certain medicines may make constipation worse.  CAUSES   Certain medicines, such as antidepressants, pain medicine, iron supplements, antacids, and water pills.   Certain diseases, such as diabetes, irritable bowel syndrome (IBS), thyroid disease, or depression.   Not drinking enough water.   Not eating enough fiber-rich foods.   Stress or travel.   Lack of physical activity or exercise.   Ignoring the urge to have a bowel movement.   Using laxatives too much.  SIGNS AND SYMPTOMS   Having fewer than three bowel movements a week.   Straining to have a bowel movement.   Having stools that are hard, dry, or larger than normal.   Feeling full or bloated.   Pain in the lower abdomen.   Not feeling relief after having a bowel movement.  DIAGNOSIS  Your health care provider will take a medical history and perform a physical exam. Further testing may be done for severe constipation. Some tests may include:  A barium enema X-ray to examine your rectum, colon, and, sometimes, your small intestine.   A sigmoidoscopy to examine your lower colon.   A colonoscopy to examine your entire colon. TREATMENT  Treatment will depend on the severity of your constipation and what is causing it. Some dietary treatments include drinking more fluids and eating more fiber-rich foods. Lifestyle treatments may include regular exercise. If these diet and lifestyle recommendations do not help, your health care provider may recommend taking over-the-counter laxative medicines to help you have bowel movements. Prescription medicines may be prescribed if over-the-counter medicines do not  work.  HOME CARE INSTRUCTIONS   Eat foods that have a lot of fiber, such as fruits, vegetables, whole grains, and beans.  Limit foods high in fat and processed sugars, such as french fries, hamburgers, cookies, candies, and soda.   A fiber supplement may be added to your diet if you cannot get enough fiber from foods.   Drink enough fluids to keep your urine clear or pale yellow.   Exercise regularly or as directed by your health care provider.   Go to the restroom when you have the urge to go. Do not hold it.   Only take over-the-counter or prescription medicines as directed by your health care provider. Do not take other medicines for constipation without talking to your health care provider first.  SEEK IMMEDIATE MEDICAL CARE IF:   You have bright red blood in your stool.   Your constipation lasts for more than 4 days or gets worse.   You have abdominal or rectal pain.   You have thin, pencil-like stools.   You have unexplained weight loss. MAKE SURE YOU:   Understand these instructions.  Will watch your condition.  Will get help right away if you are not doing well or get worse. Document Released: 03/18/2004 Document Revised: 06/25/2013 Document Reviewed:  04/01/2013 [employer] Patient Information 2015 [place], [place]. This information is not intended to replace advice given to you by your health care provider. Make sure you discuss any questions you have with your health care provider.

## 2014-03-01 NOTE — ED Notes (Signed)
Pt from [facility]. Reports dysuria and constipation to for a week. Reports burning when she urinates along with an unusual odor. Pt also reports constipation with LBM a week ago. Pt states she feels like she could have a bowel movement, but all that comes out is gas. No nausea or vomiting.

## 2014-03-01 NOTE — ED Notes (Signed)
Informed pt of need for urine sample. Pt accuses staff of spilling her urine sample and dropping her bedpan on the floor when none of this has happened. Pt given po water to drink

## 2014-03-01 NOTE — ED Notes (Signed)
Dispatch called to p/u

## 2014-03-03 LAB — URINE CULTURE: Colony Count: >=100000

## 2014-03-14 ENCOUNTER — Emergency Department (HOSPITAL_COMMUNITY)
Admission: EM | Admit: 2014-03-14 | Discharge: 2014-03-15 | Disposition: A | Discharge status: Home / Self Care | Payer: Medicare Other | Attending: Emergency Medicine | Admitting: Emergency Medicine

## 2014-03-14 ENCOUNTER — Encounter (HOSPITAL_COMMUNITY): Payer: Self-pay | Admitting: Emergency Medicine

## 2014-03-14 ENCOUNTER — Encounter: Payer: Self-pay | Admitting: *Deleted

## 2014-03-14 ENCOUNTER — Emergency Department (HOSPITAL_COMMUNITY): Payer: Medicare Other

## 2014-03-14 DIAGNOSIS — R300 Dysuria: Secondary | ICD-10-CM | POA: Diagnosis not present

## 2014-03-14 DIAGNOSIS — R109 Unspecified abdominal pain: Secondary | ICD-10-CM | POA: Diagnosis present

## 2014-03-14 DIAGNOSIS — Z8619 Personal history of other infectious and parasitic diseases: Secondary | ICD-10-CM | POA: Insufficient documentation

## 2014-03-14 DIAGNOSIS — R197 Diarrhea, unspecified: Secondary | ICD-10-CM | POA: Diagnosis not present

## 2014-03-14 DIAGNOSIS — K5900 Constipation, unspecified: Secondary | ICD-10-CM | POA: Diagnosis not present

## 2014-03-14 DIAGNOSIS — E079 Disorder of thyroid, unspecified: Secondary | ICD-10-CM | POA: Insufficient documentation

## 2014-03-14 DIAGNOSIS — Z8739 Personal history of other diseases of the musculoskeletal system and connective tissue: Secondary | ICD-10-CM | POA: Diagnosis not present

## 2014-03-14 DIAGNOSIS — F411 Generalized anxiety disorder: Secondary | ICD-10-CM | POA: Diagnosis not present

## 2014-03-14 DIAGNOSIS — Z79899 Other long term (current) drug therapy: Secondary | ICD-10-CM | POA: Insufficient documentation

## 2014-03-14 DIAGNOSIS — Z7982 Long term (current) use of aspirin: Secondary | ICD-10-CM | POA: Diagnosis not present

## 2014-03-14 DIAGNOSIS — Z791 Long term (current) use of non-steroidal anti-inflammatories (NSAID): Secondary | ICD-10-CM | POA: Insufficient documentation

## 2014-03-14 DIAGNOSIS — K219 Gastro-esophageal reflux disease without esophagitis: Secondary | ICD-10-CM | POA: Insufficient documentation

## 2014-03-14 DIAGNOSIS — F319 Bipolar disorder, unspecified: Secondary | ICD-10-CM | POA: Diagnosis not present

## 2014-03-14 DIAGNOSIS — Z90710 Acquired absence of both cervix and uterus: Secondary | ICD-10-CM | POA: Diagnosis not present

## 2014-03-14 DIAGNOSIS — R1032 Left lower quadrant pain: Secondary | ICD-10-CM | POA: Insufficient documentation

## 2014-03-14 LAB — URINALYSIS, ROUTINE W REFLEX MICROSCOPIC
BILIRUBIN URINE: NEGATIVE
Glucose, UA: NEGATIVE mg/dL
Ketones, ur: NEGATIVE mg/dL
NITRITE: NEGATIVE
PH: 7 (ref 5.0–8.0)
Protein, ur: NEGATIVE mg/dL
SPECIFIC GRAVITY, URINE: 1.02 (ref 1.005–1.030)
UROBILINOGEN UA: 1 mg/dL (ref 0.0–1.0)

## 2014-03-14 LAB — COMPREHENSIVE METABOLIC PANEL
ALBUMIN: 3.4 g/dL — AB (ref 3.5–5.2)
ALT: 10 U/L (ref 0–35)
ANION GAP: 13 (ref 5–15)
AST: 13 U/L (ref 0–37)
Alkaline Phosphatase: 64 U/L (ref 39–117)
BUN: 20 mg/dL (ref 6–23)
CALCIUM: 9.4 mg/dL (ref 8.4–10.5)
CO2: 27 mEq/L (ref 19–32)
CREATININE: 0.76 mg/dL (ref 0.50–1.10)
Chloride: 106 mEq/L (ref 96–112)
GFR calc non Af Amer: 83 mL/min — ABNORMAL LOW (ref >90)
GLUCOSE: 94 mg/dL (ref 70–99)
Potassium: 4.8 mEq/L (ref 3.7–5.3)
Sodium: 146 mEq/L (ref 137–147)
TOTAL PROTEIN: 6.3 g/dL (ref 6.0–8.3)
Total Bilirubin: 0.2 mg/dL — ABNORMAL LOW (ref 0.3–1.2)

## 2014-03-14 LAB — URINE MICROSCOPIC-ADD ON

## 2014-03-14 LAB — CBC WITH DIFFERENTIAL/PLATELET
BASOS PCT: 0 % (ref 0–1)
Basophils Absolute: 0 10*3/uL (ref 0.0–0.1)
EOS ABS: 0.4 10*3/uL (ref 0.0–0.7)
EOS PCT: 6 % — AB (ref 0–5)
HCT: 38.7 % (ref 36.0–46.0)
HEMOGLOBIN: 13 g/dL (ref 12.0–15.0)
LYMPHS ABS: 2.5 10*3/uL (ref 0.7–4.0)
Lymphocytes Relative: 38 % (ref 12–46)
MCH: 32.2 pg (ref 26.0–34.0)
MCHC: 33.6 g/dL (ref 30.0–36.0)
MCV: 95.8 fL (ref 78.0–100.0)
MONO ABS: 0.5 10*3/uL (ref 0.1–1.0)
MONOS PCT: 8 % (ref 3–12)
Neutro Abs: 3.2 10*3/uL (ref 1.7–7.7)
Neutrophils Relative %: 48 % (ref 43–77)
Platelets: 169 10*3/uL (ref 150–400)
RBC: 4.04 MIL/uL (ref 3.87–5.11)
RDW: 13.3 % (ref 11.5–15.5)
WBC: 6.6 10*3/uL (ref 4.0–10.5)

## 2014-03-14 LAB — LACTIC ACID, PLASMA: Lactic Acid, Venous: 1.4 mmol/L (ref 0.5–2.2)

## 2014-03-14 LAB — LIPASE, BLOOD: LIPASE: 16 U/L (ref 11–59)

## 2014-03-14 MED ORDER — IOHEXOL 300 MG/ML  SOLN
100.0000 mL | Freq: Once | INTRAMUSCULAR | Status: AC | PRN
  Administered 2014-03-14: 100 mL via INTRAVENOUS

## 2014-03-14 MED ORDER — SODIUM CHLORIDE 0.9 % IV BOLUS (SEPSIS)
500.0000 mL | Freq: Once | INTRAVENOUS | Status: AC
  Administered 2014-03-14: 500 mL via INTRAVENOUS

## 2014-03-14 MED ORDER — FENTANYL CITRATE 0.05 MG/ML IJ SOLN
50.0000 ug | Freq: Once | INTRAMUSCULAR | Status: AC
  Administered 2014-03-14: 50 ug via INTRAVENOUS
  Filled 2014-03-14: qty 2

## 2014-03-14 NOTE — Discharge Instructions (Signed)
Flank Pain °Flank pain refers to pain that is located on the side of the body between the upper abdomen and the back. The pain may occur over a short period of time (acute) or may be long-term or reoccurring (chronic). It may be mild or severe. Flank pain can be caused by many things. °CAUSES  °Some of the more common causes of flank pain include: °· Muscle strains.   °· Muscle spasms.   °· A disease of your spine (vertebral disk disease).   °· A lung infection (pneumonia).   °· Fluid around your lungs (pulmonary edema).   °· A kidney infection.   °· Kidney stones.   °· A very painful skin rash caused by the chickenpox virus (shingles).   °· Gallbladder disease.   °HOME CARE INSTRUCTIONS  °Home care will depend on the cause of your pain. In general, °· Rest as directed by your caregiver. °· Drink enough fluids to keep your urine clear or pale yellow. °· Only take over-the-counter or prescription medicines as directed by your caregiver. Some medicines may help relieve the pain. °· Tell your caregiver about any changes in your pain. °· Follow up with your caregiver as directed. °SEEK IMMEDIATE MEDICAL CARE IF:  °· Your pain is not controlled with medicine.   °· You have new or worsening symptoms. °· Your pain increases.   °· You have abdominal pain.   °· You have shortness of breath.   °· You have persistent nausea or vomiting.   °· You have swelling in your abdomen.   °· You feel faint or pass out.   °· You have blood in your urine. °· You have a fever or persistent symptoms for more than 2-3 days. °· You have a fever and your symptoms suddenly get worse. °MAKE SURE YOU:  °· Understand these instructions. °· Will watch your condition. °· Will get help right away if you are not doing well or get worse. °Document Released: 08/11/2005 Document Revised: 03/14/2012 Document Reviewed: 02/02/2012 °[employer]® Patient Information ©2015 [employer]. This information is not intended to replace advice given to you by your  health care provider. Make sure you discuss any questions you have with your health care provider. ° °

## 2014-03-14 NOTE — ED Notes (Signed)
Pt BIB EMS. Pt is from [facility] on [address]. Pt called EMS and reported that she has abdominal pain and diarrhea for the past 3 days. Pt also has hx of chronic lower back pain. Pt has a recent hx of UTI. Pt is a/o x 4. Pt walks with assistance. No acute distress.

## 2014-03-14 NOTE — ED Notes (Signed)
Bed: [facility] Expected date:  Expected time:  Means of arrival:  Comments: ems-BACK PAIN/DIARRHEA

## 2014-03-14 NOTE — ED Provider Notes (Signed)
CSN: [id number]     Arrival date & time 03/14/14  1923 History   First MD Initiated Contact with Patient 03/14/14 1941     Chief Complaint  Patient presents with  . Abdominal Pain  . Diarrhea     (Consider location/radiation/quality/duration/timing/severity/associated sxs/prior Treatment) HPI  Patient to the ED with complaints of abdominal pain. She lives at [facility] on [address]. She has been recently taking treatment for UTI  (03/01/2014) she has recently completed Cipro and seen on 02/19/2014 and also diagnosed with UTI treated with Keflex, had pan positive culture at that time. She reports having diarrhea a few times in the past week but reports now feeling constipated and unable to use the restroom. She has left flank pain which she thinks is related to a UTI. No cough, vomiting, fevers, weakness, chest pains, SOB, or confusion.  Past Medical History  Diagnosis Date  . Bipolar 1 disorder   . Depression   . GERD (gastroesophageal reflux disease)   . Anxiety   . Thyroid disease     hypothyroid  . Spinal stenosis   . Genital herpes   . Vitamin D deficiency    Past Surgical History  Procedure Laterality Date  . Abdominal hysterectomy    . Hammer toe surgery    . Nasal sinus surgery     Family History  Problem Relation Age of Onset  . Heart disease Father   . Hyperlipidemia Father   . Cancer Sister   . Hyperlipidemia Sister   . Hyperlipidemia Brother   . Mental illness Brother    History  Substance Use Topics  . Smoking status: Never Smoker   . Smokeless tobacco: Never Used  . Alcohol Use: No   OB History   Grav Para Term Preterm Abortions TAB SAB Ect Mult Living                 Review of Systems  All other systems reviewed and are negative.   Constitutional: Negative for fever and chills.  HENT: Negative for sore throat.  Eyes: Negative for redness.  Respiratory: Negative for shortness of breath.  Cardiovascular: Negative for chest  pain.  Gastrointestinal: Positive for constipation and diarrhea. Negative for vomiting, abdominal pain and diarrhea.  Genitourinary: Positive for dysuria. Negative for flank pain.  Musculoskeletal: Negative for back pain and neck pain.  Skin: Negative for rash.  Neurological: Negative for headaches.  Hematological: Does not bruise/bleed easily.  Psychiatric/Behavioral: Negative for confusion.    Allergies  Valtrex  Home Medications   Prior to Admission medications   Medication Sig Start Date End Date Taking? Authorizing Provider  acetaminophen (TYLENOL) 500 MG tablet Take 500 mg by mouth every 6 (six) hours as needed (for pain).   Yes Historical Provider, MD  aspirin 81 MG chewable tablet Chew 81 mg by mouth daily.   Yes Historical Provider, MD  cholecalciferol (VITAMIN D) 1000 UNITS tablet Take 1,000 Units by mouth daily.   Yes Historical Provider, MD  divalproex (DEPAKOTE) 500 MG DR tablet Take 1,000 mg by mouth at bedtime.   Yes Historical Provider, MD  docusate sodium (COLACE) 100 MG capsule Take 1 capsule (100 mg total) by mouth every 12 (twelve) hours. 03/01/14  Yes Suzi Roots, MD  FLUoxetine (PROZAC) 20 MG tablet Take 20 mg by mouth daily.    Yes Historical Provider, MD  lamoTRIgine (LAMICTAL) 150 MG tablet Take 150 mg by mouth daily.   Yes Historical Provider, MD  levothyroxine (SYNTHROID,  LEVOTHROID) 50 MCG tablet Take 50 mcg by mouth daily before breakfast.   Yes Historical Provider, MD  meloxicam (MOBIC) 15 MG tablet Take 15 mg by mouth daily.   Yes Historical Provider, MD  ondansetron (ZOFRAN-ODT) 4 MG disintegrating tablet Take 4 mg by mouth every 8 (eight) hours as needed for nausea or vomiting.   Yes Historical Provider, MD  oxyCODONE-acetaminophen (PERCOCET/ROXICET) 5-325 MG per tablet Take 1-2 tablets by mouth every 4 (four) hours as needed for moderate pain or severe pain.   Yes Historical Provider, MD  pantoprazole (PROTONIX) 40 MG tablet Take 40 mg by mouth daily.    Yes Historical Provider, MD  traMADol (ULTRAM) 50 MG tablet Take 50 mg by mouth at bedtime.   Yes Historical Provider, MD   BP 137/64  Pulse 65  Temp(Src) 97.6 F (36.4 C) (Oral)  Resp 16  SpO2 96% Physical Exam  Nursing note and vitals reviewed. Constitutional: She appears well-developed and well-nourished. No distress.  HENT:  Head: Normocephalic and atraumatic.  Eyes: Pupils are equal, round, and reactive to light.  Neck: Normal range of motion. Neck supple.  Cardiovascular: Normal rate and regular rhythm.   Pulmonary/Chest: Effort normal.  Abdominal: Soft. Bowel sounds are normal. She exhibits no distension and no ascites. There is tenderness (left flank). There is no CVA tenderness. No hernia.    Neurological: She is alert.  Skin: Skin is warm and dry.    ED Course  Procedures (including critical care time) Labs Review Labs Reviewed  URINALYSIS, ROUTINE W REFLEX MICROSCOPIC - Abnormal; Notable for the following:    APPearance CLOUDY (*)    Hgb urine dipstick TRACE (*)    Leukocytes, UA SMALL (*)    All other components within normal limits  CBC WITH DIFFERENTIAL - Abnormal; Notable for the following:    Eosinophils Relative 6 (*)    All other components within normal limits  COMPREHENSIVE METABOLIC PANEL - Abnormal; Notable for the following:    Albumin 3.4 (*)    Total Bilirubin 0.2 (*)    GFR calc non Af Amer 83 (*)    All other components within normal limits  URINE MICROSCOPIC-ADD ON - Abnormal; Notable for the following:    Squamous Epithelial / LPF FEW (*)    Bacteria, UA MANY (*)    All other components within normal limits  URINE CULTURE  CLOSTRIDIUM DIFFICILE BY PCR  LACTIC ACID, PLASMA  LIPASE, BLOOD    Imaging Review Ct Abdomen Pelvis W Contrast  03/14/2014   CLINICAL DATA:  Abdominal pain and diarrhea.  EXAM: CT ABDOMEN AND PELVIS WITH CONTRAST  TECHNIQUE: Multidetector CT imaging of the abdomen and pelvis was performed using the standard  protocol following bolus administration of intravenous contrast.  CONTRAST:  OMNIPAQUE IOHEXOL 300 MG/ML  SOLN  COMPARISON:  10/25/2012.  FINDINGS: The lung bases demonstrate minimal dependent atelectasis. The heart is normal in size. No pericardial effusion. There is a moderate to large hiatal hernia.  The solid abdominal organs are unremarkable. The gallbladder is normal. No common bowel duct dilatation.  The stomach, duodenum, small bowel and colon are unremarkable. No inflammatory changes, mass lesions or obstructive findings. The aorta is normal in caliber. The major branch vessels are patent. No mesenteric or retroperitoneal mass or adenopathy.  The uterus is surgically absent. No pelvic mass or adenopathy. The bladder appears normal. No inguinal mass or adenopathy.  IMPRESSION: No acute abdominal/pelvic findings, mass lesions are adenopathy.   Electronically Signed  By: Loralie Champagne M.D.   On: 03/14/2014 23:43   Dg Abd Acute W/chest  03/14/2014   CLINICAL DATA:  Abdominal pain and diarrhea.  EXAM: ACUTE ABDOMEN SERIES (ABDOMEN 2 VIEW & CHEST 1 VIEW)  COMPARISON:  10/25/2012.  FINDINGS: There is no evidence of dilated bowel loops or free intraperitoneal air. No radiopaque calculi or other significant radiographic abnormality is seen. Heart size and mediastinal contours are within normal limits. Both lungs are clear.  IMPRESSION: Negative abdominal radiographs.  No acute cardiopulmonary disease.   Electronically Signed   By: Andreas Newport M.D.   On: 03/14/2014 21:11     EKG Interpretation None      MDM   Final diagnoses:  Left lower quadrant pain    Discussed case with Dr. Rubin Payor who recommends CT abd/pelv to further evaluate patients abdomen.  Medications  fentaNYL (SUBLIMAZE) injection 50 mcg (50 mcg Intravenous Given 03/14/14 2150)  sodium chloride 0.9 % bolus 500 mL (0 mLs Intravenous Stopped 03/14/14 2340)  iohexol (OMNIPAQUE) 300 MG/ML solution 100 mL (100 mLs  Intravenous Contrast Given 03/14/14 2321)   She has a negative lactate, an essentially normal CBC w/dif (eos pct are elevated at 6) Her CMP and lipase show no acute abnormalities and her dg abd w/chest shows no acute abnormalities. Urine and Urine culture are pending.   12:00 am The patients urinalysis shows many bacteria but only 3-6 WBC, RBC 0-2, small leukocytes and negative nitrites. Dr. Rubin Payor does not feel that this requires intervention or further abx. She has had a CT abd/pelv that shows no concerning findings. At this time her pain is under control and Dr. Rubin Payor feels that it is safe to discharge her home at this time. I agree. Pt discharged back to [facility].  73 y.o.Illene Labrador evaluation in the Emergency Department is complete. It has been determined that no acute conditions requiring further emergency intervention are present at this time. The patient/guardian have been advised of the diagnosis and plan. We have discussed signs and symptoms that warrant return to the ED, such as changes or worsening in symptoms.  Vital signs are stable at discharge. Filed Vitals:   03/14/14 2218  BP: 137/64  Pulse: 65  Temp:   Resp: 16    Patient/guardian has voiced understanding and agreed to follow-up with the PCP or specialist.   Dorthula Matas, PA-C 03/15/14 0001

## 2014-03-15 NOTE — ED Provider Notes (Signed)
Medical screening examination/treatment/procedure(s) were conducted as a shared visit with non-physician practitioner(s) and myself.  I personally evaluated the patient during the encounter.   EKG Interpretation None     Patient with abdominal pain. Recent UTI. Ct done due to tenderness. negative  Juliet Rude. Rubin Payor, MD 03/15/14 1541

## 2014-03-16 LAB — URINE CULTURE

## 2014-03-18 ENCOUNTER — Encounter (HOSPITAL_COMMUNITY): Payer: Self-pay | Admitting: Emergency Medicine

## 2014-03-18 ENCOUNTER — Emergency Department (HOSPITAL_COMMUNITY)
Admission: EM | Admit: 2014-03-18 | Discharge: 2014-03-18 | Disposition: A | Discharge status: Home / Self Care | Payer: Medicare Other | Attending: Emergency Medicine | Admitting: Emergency Medicine

## 2014-03-18 DIAGNOSIS — Z7982 Long term (current) use of aspirin: Secondary | ICD-10-CM | POA: Insufficient documentation

## 2014-03-18 DIAGNOSIS — F319 Bipolar disorder, unspecified: Secondary | ICD-10-CM | POA: Diagnosis not present

## 2014-03-18 DIAGNOSIS — E079 Disorder of thyroid, unspecified: Secondary | ICD-10-CM | POA: Diagnosis not present

## 2014-03-18 DIAGNOSIS — F411 Generalized anxiety disorder: Secondary | ICD-10-CM | POA: Diagnosis not present

## 2014-03-18 DIAGNOSIS — Z79899 Other long term (current) drug therapy: Secondary | ICD-10-CM | POA: Insufficient documentation

## 2014-03-18 DIAGNOSIS — E559 Vitamin D deficiency, unspecified: Secondary | ICD-10-CM | POA: Diagnosis not present

## 2014-03-18 DIAGNOSIS — K219 Gastro-esophageal reflux disease without esophagitis: Secondary | ICD-10-CM | POA: Insufficient documentation

## 2014-03-18 DIAGNOSIS — M549 Dorsalgia, unspecified: Secondary | ICD-10-CM | POA: Insufficient documentation

## 2014-03-18 DIAGNOSIS — Z8619 Personal history of other infectious and parasitic diseases: Secondary | ICD-10-CM | POA: Insufficient documentation

## 2014-03-18 DIAGNOSIS — R4701 Aphasia: Secondary | ICD-10-CM | POA: Diagnosis not present

## 2014-03-18 DIAGNOSIS — M5441 Lumbago with sciatica, right side: Secondary | ICD-10-CM

## 2014-03-18 LAB — URINALYSIS, ROUTINE W REFLEX MICROSCOPIC
Glucose, UA: NEGATIVE mg/dL
Ketones, ur: NEGATIVE mg/dL
Leukocytes, UA: NEGATIVE
Nitrite: NEGATIVE
PH: 6 (ref 5.0–8.0)
Protein, ur: NEGATIVE mg/dL
Specific Gravity, Urine: 1.035 — ABNORMAL HIGH (ref 1.005–1.030)
Urobilinogen, UA: 1 mg/dL (ref 0.0–1.0)

## 2014-03-18 LAB — URINE MICROSCOPIC-ADD ON

## 2014-03-18 MED ORDER — DEXAMETHASONE SODIUM PHOSPHATE 10 MG/ML IJ SOLN
10.0000 mg | Freq: Once | INTRAMUSCULAR | Status: AC
  Administered 2014-03-18: 10 mg via INTRAVENOUS
  Filled 2014-03-18: qty 1

## 2014-03-18 MED ORDER — HYDROMORPHONE HCL PF 1 MG/ML IJ SOLN
1.0000 mg | Freq: Once | INTRAMUSCULAR | Status: AC
  Administered 2014-03-18: 1 mg via INTRAMUSCULAR
  Filled 2014-03-18: qty 1

## 2014-03-18 MED ORDER — IBUPROFEN 800 MG PO TABS
800.0000 mg | ORAL_TABLET | Freq: Once | ORAL | Status: AC
  Administered 2014-03-18: 800 mg via ORAL
  Filled 2014-03-18: qty 1

## 2014-03-18 NOTE — ED Notes (Signed)
Per [facility]: pt. Resident of [facility]. Pt. Called EMS regarding lower back pain that started 2 days ago. Pain radiating to R leg. Hx of spinal stenosis and bipolar d/o. A/O x4. Pt. Ambulatory with assistance. 82 HR 94% RA

## 2014-03-18 NOTE — Discharge Instructions (Signed)
As discussed, your evaluation today has been largely reassuring.  But, it is important that you monitor your condition carefully, and do not hesitate to return to the ED if you develop new, or concerning changes in your condition.  Otherwise, please follow-up with your physician for appropriate ongoing care.  Back Pain, Adult Back pain is very common. The pain often gets better over time. The cause of back pain is usually not dangerous. Most people can learn to manage their back pain on their own.  HOME CARE   Stay active. Start with short walks on flat ground if you can. Try to walk farther each day.  Do not sit, drive, or stand in one place for more than 30 minutes. Do not stay in bed.  Do not avoid exercise or work. Activity can help your back heal faster.  Be careful when you bend or lift an object. Bend at your knees, keep the object close to you, and do not twist.  Sleep on a firm mattress. Lie on your side, and bend your knees. If you lie on your back, put a pillow under your knees.  Only take medicines as told by your doctor.  Put ice on the injured area.  Put ice in a plastic bag.  Place a towel between your skin and the bag.  Leave the ice on for 15-20 minutes, 03-04 times a day for the first 2 to 3 days. After that, you can switch between ice and heat packs.  Ask your doctor about back exercises or massage.  Avoid feeling anxious or stressed. Find good ways to deal with stress, such as exercise. GET HELP RIGHT AWAY IF:   Your pain does not go away with rest or medicine.  Your pain does not go away in 1 week.  You have new problems.  You do not feel well.  The pain spreads into your legs.  You cannot control when you poop (bowel movement) or pee (urinate).  Your arms or legs feel weak or lose feeling (numbness).  You feel sick to your stomach (nauseous) or throw up (vomit).  You have belly (abdominal) pain.  You feel like you may pass out (faint). MAKE  SURE YOU:   Understand these instructions.  Will watch your condition.  Will get help right away if you are not doing well or get worse. Document Released: 12/07/2007 Document Revised: 09/12/2011 Document Reviewed: 10/22/2013 [facility] Patient Information 2015 [place], [place]. This information is not intended to replace advice given to you by your health care provider. Make sure you discuss any questions you have with your health care provider.

## 2014-03-18 NOTE — ED Notes (Signed)
Patient attempting to use the restroom. Patient aware we may have to cath since incontinent.

## 2014-03-18 NOTE — ED Notes (Signed)
Patient could not produse a urine sample. Doesn't want cath. Wants to try again in a few minutes.

## 2014-03-18 NOTE — ED Provider Notes (Addendum)
CSN: [id number]     Arrival date & time 03/18/14  1042 History   First MD Initiated Contact with Patient 03/18/14 1054     Chief Complaint  Patient presents with  . Back Pain     (Consider location/radiation/quality/duration/timing/severity/associated sxs/prior Treatment) HPI Patient presents with concerns of persistent low back pain.  Pain is diffuse across the lower back, nonradiating superiorly, but radiates down the posterior right leg. Pain is worse with ambulation, bending. There is no associated new abdominal pain, incontinence, loss of sensation in the extremities. No new fevers, chills, chest pain, or any other focal concerns. Patient has a history of spinal stenosis, takes narcotic, ibuprofen regularly for pain control, which is not controlling her pain currently. Patient has a primary care followup capacity. Past Medical History  Diagnosis Date  . Bipolar 1 disorder   . Depression   . GERD (gastroesophageal reflux disease)   . Anxiety   . Thyroid disease     hypothyroid  . Spinal stenosis   . Genital herpes   . Vitamin D deficiency    Past Surgical History  Procedure Laterality Date  . Abdominal hysterectomy    . Hammer toe surgery    . Nasal sinus surgery     Family History  Problem Relation Age of Onset  . Heart disease Father   . Hyperlipidemia Father   . Cancer Sister   . Hyperlipidemia Sister   . Hyperlipidemia Brother   . Mental illness Brother    History  Substance Use Topics  . Smoking status: Never Smoker   . Smokeless tobacco: Never Used  . Alcohol Use: No   OB History   Grav Para Term Preterm Abortions TAB SAB Ect Mult Living                 Review of Systems  Constitutional:       Per HPI, otherwise negative  HENT:       Per HPI, otherwise negative  Respiratory:       Per HPI, otherwise negative  Cardiovascular:       Per HPI, otherwise negative  Gastrointestinal: Negative for vomiting.  Endocrine:       Negative aside from HPI   Genitourinary:       Neg aside from HPI  Recent uti  Musculoskeletal:       Per HPI, otherwise negative  Skin: Negative.   Neurological: Negative for syncope.      Allergies  Valtrex  Home Medications   Prior to Admission medications   Medication Sig Start Date End Date Taking? Authorizing Provider  acetaminophen (TYLENOL) 500 MG tablet Take 500 mg by mouth every 6 (six) hours as needed (for pain).   Yes Historical Provider, MD  aspirin 81 MG chewable tablet Chew 81 mg by mouth daily.   Yes Historical Provider, MD  cholecalciferol (VITAMIN D) 1000 UNITS tablet Take 1,000 Units by mouth daily.   Yes Historical Provider, MD  divalproex (DEPAKOTE) 500 MG DR tablet Take 1,000 mg by mouth at bedtime.   Yes Historical Provider, MD  docusate sodium (COLACE) 100 MG capsule Take 100 mg by mouth every 12 (twelve) hours.   Yes Historical Provider, MD  FLUoxetine (PROZAC) 20 MG capsule Take 20 mg by mouth daily with breakfast.   Yes Historical Provider, MD  ibuprofen (ADVIL,MOTRIN) 400 MG tablet Take 400 mg by mouth every 6 (six) hours as needed for moderate pain.   Yes Historical Provider, MD  lamoTRIgine (LAMICTAL) 150 MG  tablet Take 150 mg by mouth at bedtime.    Yes Historical Provider, MD  levothyroxine (SYNTHROID, LEVOTHROID) 50 MCG tablet Take 50 mcg by mouth daily before breakfast.   Yes Historical Provider, MD  meloxicam (MOBIC) 15 MG tablet Take 15 mg by mouth daily.   Yes Historical Provider, MD  ondansetron (ZOFRAN-ODT) 4 MG disintegrating tablet Take 4 mg by mouth every 8 (eight) hours as needed for nausea or vomiting.   Yes Historical Provider, MD  oxyCODONE-acetaminophen (PERCOCET/ROXICET) 5-325 MG per tablet Take 1-2 tablets by mouth every 4 (four) hours as needed for severe pain.    Yes Historical Provider, MD  pantoprazole (PROTONIX) 40 MG tablet Take 40 mg by mouth daily.   Yes Historical Provider, MD  polyethylene glycol (MIRALAX / GLYCOLAX) packet Take 17 g by mouth daily as  needed for mild constipation.   Yes Historical Provider, MD  traMADol (ULTRAM) 50 MG tablet Take 50 mg by mouth at bedtime.   Yes Historical Provider, MD  white petrolatum (VASELINE) GEL Apply 1 application topically 3 (three) times daily as needed (for rectal dryness).   Yes Historical Provider, MD   BP 114/46  Pulse 67  Temp(Src) 98 F (36.7 C) (Oral)  Resp 14  SpO2 94% Physical Exam  Nursing note and vitals reviewed. Constitutional: She is oriented to person, place, and time. She appears well-developed and well-nourished. No distress.  HENT:  Head: Normocephalic and atraumatic.  Eyes: Conjunctivae and EOM are normal.  Cardiovascular: Normal rate and regular rhythm.   Pulmonary/Chest: Effort normal and breath sounds normal. No stridor. No respiratory distress.  Abdominal: She exhibits no distension. There is no tenderness.  Musculoskeletal: She exhibits no edema.  Patient flexes each leg independently, with some discomfort in the right posterior low back.  She moves both ankles appropriately, has preserved distal sensation.   Neurological: She is alert and oriented to person, place, and time. No cranial nerve deficit.  Skin: Skin is warm and dry.  Psychiatric: She has a normal mood and affect.    ED Course  Procedures (including critical care time) Labs Review Labs Reviewed  URINALYSIS, ROUTINE W REFLEX MICROSCOPIC - Abnormal; Notable for the following:    Color, Urine AMBER (*)    Specific Gravity, Urine 1.035 (*)    Hgb urine dipstick TRACE (*)    Bilirubin Urine SMALL (*)    All other components within normal limits  URINE MICROSCOPIC-ADD ON - Abnormal; Notable for the following:    Bacteria, UA FEW (*)    All other components within normal limits   A review of the patient's chart demonstrates a prior diagnosis of spinal stenosis, as well as multiple visits here for pain control in the past months Patient is a primary care physician whom she can followup.  1:55  PM Patient states that her pain has resolved. And discussed the patient's history with staff at her primary care office, we discussed MRI results from last year, with spinal stenosis, but with foraminal minimal narrowing.  3:30 PM Patient remains in no distress, seemingly comfortable, lying in supine position.   MDM  Patient presents with back pain.  Patient is neurovascularly intact and hemodynamically stable.  Patient has a well-documented history of low back pain, with MRI from one year ago demonstrating spinal stenosis, now for minimal narrowing.  With no new incontinence, inability to walk, lower extremity dysesthesia or weakness, patient was discharged in stable condition to follow up with primary care further evaluation, management.  Gerhard Munch, MD 03/18/14 1502  Gerhard Munch, MD 03/18/14 [phone]

## 2014-03-18 NOTE — ED Notes (Signed)
Bed: [facility] Expected date:  Expected time:  Means of arrival:  Comments: EMS- elderly, back pain, Hx spinal stenosis

## 2014-03-20 ENCOUNTER — Encounter (HOSPITAL_COMMUNITY): Payer: Self-pay | Admitting: Emergency Medicine

## 2014-03-20 ENCOUNTER — Emergency Department (HOSPITAL_COMMUNITY)
Admission: EM | Admit: 2014-03-20 | Discharge: 2014-03-21 | Disposition: A | Discharge status: Home / Self Care | Payer: Medicare Other | Attending: Emergency Medicine | Admitting: Emergency Medicine

## 2014-03-20 DIAGNOSIS — F411 Generalized anxiety disorder: Secondary | ICD-10-CM | POA: Insufficient documentation

## 2014-03-20 DIAGNOSIS — M545 Low back pain, unspecified: Secondary | ICD-10-CM | POA: Insufficient documentation

## 2014-03-20 DIAGNOSIS — Z8619 Personal history of other infectious and parasitic diseases: Secondary | ICD-10-CM | POA: Diagnosis not present

## 2014-03-20 DIAGNOSIS — Z7982 Long term (current) use of aspirin: Secondary | ICD-10-CM | POA: Diagnosis not present

## 2014-03-20 DIAGNOSIS — K219 Gastro-esophageal reflux disease without esophagitis: Secondary | ICD-10-CM | POA: Diagnosis not present

## 2014-03-20 DIAGNOSIS — G8929 Other chronic pain: Secondary | ICD-10-CM | POA: Insufficient documentation

## 2014-03-20 DIAGNOSIS — E559 Vitamin D deficiency, unspecified: Secondary | ICD-10-CM | POA: Diagnosis not present

## 2014-03-20 DIAGNOSIS — Z79899 Other long term (current) drug therapy: Secondary | ICD-10-CM | POA: Insufficient documentation

## 2014-03-20 DIAGNOSIS — F319 Bipolar disorder, unspecified: Secondary | ICD-10-CM | POA: Diagnosis not present

## 2014-03-20 DIAGNOSIS — E039 Hypothyroidism, unspecified: Secondary | ICD-10-CM | POA: Insufficient documentation

## 2014-03-20 DIAGNOSIS — Z90710 Acquired absence of both cervix and uterus: Secondary | ICD-10-CM | POA: Insufficient documentation

## 2014-03-20 DIAGNOSIS — Z791 Long term (current) use of non-steroidal anti-inflammatories (NSAID): Secondary | ICD-10-CM | POA: Diagnosis not present

## 2014-03-20 DIAGNOSIS — R1084 Generalized abdominal pain: Secondary | ICD-10-CM | POA: Insufficient documentation

## 2014-03-20 LAB — URINALYSIS, ROUTINE W REFLEX MICROSCOPIC
Bilirubin Urine: NEGATIVE
GLUCOSE, UA: NEGATIVE mg/dL
Hgb urine dipstick: NEGATIVE
KETONES UR: NEGATIVE mg/dL
Leukocytes, UA: NEGATIVE
Nitrite: NEGATIVE
PROTEIN: NEGATIVE mg/dL
Specific Gravity, Urine: 1.023 (ref 1.005–1.030)
Urobilinogen, UA: 1 mg/dL (ref 0.0–1.0)
pH: 7 (ref 5.0–8.0)

## 2014-03-20 NOTE — Discharge Instructions (Signed)
SEEK IMMEDIATE MEDICAL ATTENTION IF: °New numbness, tingling, weakness, or problem with the use of your arms or legs.  °Severe back pain not relieved with medications.  °Change in bowel or bladder control.  °Increasing pain in any areas of the body (such as chest or abdominal pain).  °Shortness of breath, dizziness or fainting.  °Nausea (feeling sick to your stomach), vomiting, fever, or sweats. ° °Abdominal (belly) pain can be caused by many things. Your caregiver performed an examination and possibly ordered blood/urine tests and imaging (CT scan, x-rays, ultrasound). Many cases can be observed and treated at home after initial evaluation in the emergency department. Even though you are being discharged home, abdominal pain can be unpredictable. Therefore, you need a repeated exam if your pain does not resolve, returns, or worsens. Most patients with abdominal pain don't have to be admitted to the hospital or have surgery, but serious problems like appendicitis and gallbladder attacks can start out as nonspecific pain. Many abdominal conditions cannot be diagnosed in one visit, so follow-up evaluations are very important. °SEEK IMMEDIATE MEDICAL ATTENTION IF: °The pain does not go away or becomes severe.  °A temperature above 101 develops.  °Repeated vomiting occurs (multiple episodes).  °The pain becomes localized to portions of the abdomen. The right side could possibly be appendicitis. In an adult, the left lower portion of the abdomen could be colitis or diverticulitis.  °Blood is being passed in stools or vomit (bright red or black tarry stools).  °Return also if you develop chest pain, difficulty breathing, dizziness or fainting, or become confused, poorly responsive, or inconsolable (young children). °

## 2014-03-20 NOTE — ED Notes (Signed)
Per EMS, Pt from [facility] and called EMS c/o back pain x two weeks. She called on her own, facility did not know. Pt denies fall, accidents. Pain increases with motion. While EMS was getting pt on stretcher, RN at facility sts that sister does not want pt to go to ED, sister is POA. Pt A&Ox4 and was able to make her own decisions. Pt was here a couple days ago and told she had spinal stenosis. Pt informed of these things and still wanted to be evaluated.

## 2014-03-20 NOTE — ED Notes (Signed)
Attempted to call report to [facility], unable to speak with anyone. Left voicemail for call back.

## 2014-03-20 NOTE — ED Notes (Signed)
Pt sts she thinks she has a UTI. Pt c/o foul smelling urine.

## 2014-03-20 NOTE — ED Notes (Signed)
Bed: [facility]07 Expected date:  Expected time:  Means of arrival:  Comments: EMS/ambulatory lower back pain

## 2014-03-20 NOTE — ED Notes (Signed)
[facility] called  

## 2014-03-20 NOTE — ED Provider Notes (Signed)
CSN: [id number]     Arrival date & time 03/20/14  1955 History   First MD Initiated Contact with Patient 03/20/14 2125     Chief Complaint  Patient presents with  . Back Pain     (Consider location/radiation/quality/duration/timing/severity/associated sxs/prior Treatment) HPI Chronic severe low back pain 24 hours a day for months if not longer, chronic abdominal pain for at least the last couple weeks with recent unremarkable CT scan of the abdomen recent unremarkable labs no radiation down her legs no weakness or numbness to her legs no change in bowel or bladder control. Patient takes Percocet and ibuprofen for her chronic back pain.  Minimal diffuse abdominal tenderness Diffuse lumbar tenderness bilateral lower extremities no edema DP and PT pulses intact normal light touch 5-5 strength both legs deep tendon reflexes intact knees and ankles bilaterally; patient states she is able to walk with her walker which is baseline for her so gait not tested today in the emergency department  Past Medical History  Diagnosis Date  . Bipolar 1 disorder   . Depression   . GERD (gastroesophageal reflux disease)   . Anxiety   . Thyroid disease     hypothyroid  . Spinal stenosis   . Genital herpes   . Vitamin D deficiency    Past Surgical History  Procedure Laterality Date  . Abdominal hysterectomy    . Hammer toe surgery    . Nasal sinus surgery     Family History  Problem Relation Age of Onset  . Heart disease Father   . Hyperlipidemia Father   . Cancer Sister   . Hyperlipidemia Sister   . Hyperlipidemia Brother   . Mental illness Brother    History  Substance Use Topics  . Smoking status: Never Smoker   . Smokeless tobacco: Never Used  . Alcohol Use: No   OB History   Grav Para Term Preterm Abortions TAB SAB Ect Mult Living                 Review of Systems 10 Systems reviewed and are negative for acute change except as noted in the HPI.   Allergies  Valtrex  Home  Medications   Prior to Admission medications   Medication Sig Start Date End Date Taking? Authorizing Provider  acetaminophen (TYLENOL) 500 MG tablet Take 500 mg by mouth every 6 (six) hours as needed (for pain).   Yes Historical Provider, MD  aspirin 81 MG chewable tablet Chew 81 mg by mouth daily.   Yes Historical Provider, MD  cholecalciferol (VITAMIN D) 1000 UNITS tablet Take 1,000 Units by mouth daily.   Yes Historical Provider, MD  divalproex (DEPAKOTE) 500 MG DR tablet Take 1,000 mg by mouth at bedtime.   Yes Historical Provider, MD  docusate sodium (COLACE) 100 MG capsule Take 100 mg by mouth every 12 (twelve) hours.   Yes Historical Provider, MD  FLUoxetine (PROZAC) 20 MG capsule Take 20 mg by mouth daily with breakfast.   Yes Historical Provider, MD  ibuprofen (ADVIL,MOTRIN) 400 MG tablet Take 400 mg by mouth every 6 (six) hours as needed for moderate pain.   Yes Historical Provider, MD  lamoTRIgine (LAMICTAL) 150 MG tablet Take 150 mg by mouth at bedtime.    Yes Historical Provider, MD  levothyroxine (SYNTHROID, LEVOTHROID) 50 MCG tablet Take 50 mcg by mouth daily before breakfast.   Yes Historical Provider, MD  meloxicam (MOBIC) 15 MG tablet Take 15 mg by mouth daily.   Yes Historical  Provider, MD  ondansetron (ZOFRAN-ODT) 4 MG disintegrating tablet Take 4 mg by mouth every 8 (eight) hours as needed for nausea or vomiting.   Yes Historical Provider, MD  oxyCODONE-acetaminophen (PERCOCET/ROXICET) 5-325 MG per tablet Take 1-2 tablets by mouth every 4 (four) hours as needed for severe pain.    Yes Historical Provider, MD  pantoprazole (PROTONIX) 40 MG tablet Take 40 mg by mouth daily.   Yes Historical Provider, MD  polyethylene glycol (MIRALAX / GLYCOLAX) packet Take 17 g by mouth daily as needed for mild constipation.   Yes Historical Provider, MD  traMADol (ULTRAM) 50 MG tablet Take 50 mg by mouth at bedtime.   Yes Historical Provider, MD  white petrolatum (VASELINE) GEL Apply 1  application topically 3 (three) times daily as needed (rectal dryness).    Historical Provider, MD   BP 118/84  Pulse 77  Temp(Src) 98.1 F (36.7 C) (Oral)  Resp 16  SpO2 100% Physical Exam  Nursing note and vitals reviewed. Constitutional:  Awake, alert, nontoxic appearance with baseline speech.  HENT:  Head: Atraumatic.  Eyes: Pupils are equal, round, and reactive to light. Right eye exhibits no discharge. Left eye exhibits no discharge.  Neck: Neck supple.  Cardiovascular: Normal rate and regular rhythm.   No murmur heard. Pulmonary/Chest: Effort normal and breath sounds normal. No respiratory distress. She has no wheezes. She has no rales. She exhibits no tenderness.  Abdominal: Soft. Bowel sounds are normal. She exhibits no mass. There is tenderness. There is no rebound.  Minimal diffuse abdominal tenderness   Musculoskeletal: She exhibits tenderness.       Thoracic back: She exhibits no tenderness.       Lumbar back: She exhibits no tenderness.  Bilateral lower extremities non tender without new rashes or color change, baseline ROM with intact DP / PT pulses, CR<2 secs all digits bilaterally, sensation baseline light touch bilaterally for pt, DTR's symmetric and intact bilaterally KJ / AJ, motor symmetric bilateral 5 / 5 hip flexion, quadriceps, hamstrings, EHL, foot dorsiflexion, foot plantarflexion, diffuse lumbar tenderness bilateral lower extremities no edema DP and PT pulses intact normal light touch 5-5 strength both legs deep tendon reflexes intact knees and ankles bilaterally; patient states she is able to walk with her walker today which is baseline for her so gait not tested today in the emergency department   Neurological: She is alert.  Mental status baseline for patient.  Upper extremity motor strength and sensation intact and symmetric bilaterally.  Skin: No rash noted.  Psychiatric: She has a normal mood and affect.    ED Course  Procedures (including critical  care time) Labs Review Labs Reviewed  URINALYSIS, ROUTINE W REFLEX MICROSCOPIC    Imaging Review No results found.   EKG Interpretation None      MDM   Final diagnoses:  Chronic low back pain  Generalized abdominal pain    Patient / Family / Caregiver informed of clinical course, understand medical decision-making process, and agree with plan.  I doubt any other EMC precluding discharge at this time including, but not necessarily limited to the following:AAA, cauda equina syndrome, peritonitis.    Hurman Horn, MD 04/02/14 [phone]

## 2014-03-23 ENCOUNTER — Emergency Department (HOSPITAL_COMMUNITY): Payer: Medicare Other

## 2014-03-23 ENCOUNTER — Emergency Department (HOSPITAL_COMMUNITY)
Admission: EM | Admit: 2014-03-23 | Discharge: 2014-03-23 | Disposition: A | Discharge status: Skilled Nursing Facility | Payer: Medicare Other | Attending: Emergency Medicine | Admitting: Emergency Medicine

## 2014-03-23 ENCOUNTER — Encounter (HOSPITAL_COMMUNITY): Payer: Self-pay | Admitting: Emergency Medicine

## 2014-03-23 DIAGNOSIS — Z8619 Personal history of other infectious and parasitic diseases: Secondary | ICD-10-CM | POA: Diagnosis not present

## 2014-03-23 DIAGNOSIS — Z79899 Other long term (current) drug therapy: Secondary | ICD-10-CM | POA: Diagnosis not present

## 2014-03-23 DIAGNOSIS — Y939 Activity, unspecified: Secondary | ICD-10-CM | POA: Insufficient documentation

## 2014-03-23 DIAGNOSIS — F319 Bipolar disorder, unspecified: Secondary | ICD-10-CM | POA: Insufficient documentation

## 2014-03-23 DIAGNOSIS — K219 Gastro-esophageal reflux disease without esophagitis: Secondary | ICD-10-CM | POA: Diagnosis not present

## 2014-03-23 DIAGNOSIS — Y929 Unspecified place or not applicable: Secondary | ICD-10-CM | POA: Diagnosis not present

## 2014-03-23 DIAGNOSIS — E079 Disorder of thyroid, unspecified: Secondary | ICD-10-CM | POA: Diagnosis not present

## 2014-03-23 DIAGNOSIS — F411 Generalized anxiety disorder: Secondary | ICD-10-CM | POA: Diagnosis not present

## 2014-03-23 DIAGNOSIS — R296 Repeated falls: Secondary | ICD-10-CM | POA: Insufficient documentation

## 2014-03-23 DIAGNOSIS — M5441 Lumbago with sciatica, right side: Secondary | ICD-10-CM

## 2014-03-23 DIAGNOSIS — Z7982 Long term (current) use of aspirin: Secondary | ICD-10-CM | POA: Diagnosis not present

## 2014-03-23 DIAGNOSIS — E559 Vitamin D deficiency, unspecified: Secondary | ICD-10-CM | POA: Insufficient documentation

## 2014-03-23 DIAGNOSIS — IMO0002 Reserved for concepts with insufficient information to code with codable children: Secondary | ICD-10-CM | POA: Insufficient documentation

## 2014-03-23 DIAGNOSIS — W19XXXA Unspecified fall, initial encounter: Secondary | ICD-10-CM

## 2014-03-23 MED ORDER — HYDROCODONE-ACETAMINOPHEN 5-325 MG PO TABS
2.0000 | ORAL_TABLET | Freq: Once | ORAL | Status: AC
  Administered 2014-03-23: 2 via ORAL
  Filled 2014-03-23: qty 2

## 2014-03-23 NOTE — ED Notes (Signed)
Dr. Wofford at bedside 

## 2014-03-23 NOTE — ED Notes (Signed)
Patient transported to X-ray 

## 2014-03-23 NOTE — ED Notes (Addendum)
Patient states that she fell backwards today from a standing position onto her bed. Patient states she stayed in bed and called EMS for back pain. Patient was seen and treated here in the ED on 03/20/14 for back pain as well. Patient is upset and  "irritated" about being in a facility. She admits she was placed there for safety due to her frequent falls, but expressed to EMS that she just does not want to be there.
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
## 2014-03-23 NOTE — Discharge Instructions (Signed)
Abdominal Pain Many things can cause abdominal pain. Usually, abdominal pain is not caused by a disease and will improve without treatment. It can often be observed and treated at home. Your health care provider will do a physical exam and possibly order blood tests and X-rays to help determine the seriousness of your pain. However, in many cases, more time must pass before a clear cause of the pain can be found. Before that point, your health care provider may not know if you need more testing or further treatment. HOME CARE INSTRUCTIONS  Monitor your abdominal pain for any changes. The following actions may help to alleviate any discomfort you are experiencing:  Only take over-the-counter or prescription medicines as directed by your health care provider.  Do not take laxatives unless directed to do so by your health care provider.  Try a clear liquid diet (broth, tea, or water) as directed by your health care provider. Slowly move to a bland diet as tolerated. SEEK MEDICAL CARE IF:  You have unexplained abdominal pain.  You have abdominal pain associated with nausea or diarrhea.  You have pain when you urinate or have a bowel movement.  You experience abdominal pain that wakes you in the night.  You have abdominal pain that is worsened or improved by eating food.  You have abdominal pain that is worsened with eating fatty foods.  You have a fever. SEEK IMMEDIATE MEDICAL CARE IF:   Your pain does not go away within 2 hours.  You keep throwing up (vomiting).  Your pain is felt only in portions of the abdomen, such as the right side or the left lower portion of the abdomen.  You pass bloody or black tarry stools. MAKE SURE YOU:  Understand these instructions.   Will watch your condition.   Will get help right away if you are not doing well or get worse.  Document Released: 03/30/2005 Document Revised: 06/25/2013 Document Reviewed: 02/27/2013 [facility] Patient Information  2015 [place], [place]. This information is not intended to replace advice given to you by your health care provider. Make sure you discuss any questions you have with your health care provider.  Fall Prevention and Home Safety Falls cause injuries and can affect all age groups. It is possible to use preventive measures to significantly decrease the likelihood of falls. There are many simple measures which can make your home safer and prevent falls. OUTDOORS  Repair cracks and edges of walkways and driveways.  Remove high doorway thresholds.  Trim shrubbery on the main path into your home.  Have good outside lighting.  Clear walkways of tools, rocks, debris, and clutter.  Check that handrails are not broken and are securely fastened. Both sides of steps should have handrails.  Have leaves, snow, and ice cleared regularly.  Use sand or salt on walkways during winter months.  In the garage, clean up grease or oil spills. BATHROOM  Install night lights.  Install grab bars by the toilet and in the tub and shower.  Use non-skid mats or decals in the tub or shower.  Place a plastic non-slip stool in the shower to sit on, if needed.  Keep floors dry and clean up all water on the floor immediately.  Remove soap buildup in the tub or shower on a regular basis.  Secure bath mats with non-slip, double-sided rug tape.  Remove throw rugs and tripping hazards from the floors. BEDROOMS  Install night lights.  Make sure a bedside light is easy to  reach.  Do not use oversized bedding.  Keep a telephone by your bedside.  Have a firm chair with side arms to use for getting dressed.  Remove throw rugs and tripping hazards from the floor. KITCHEN  Keep handles on pots and pans turned toward the center of the stove. Use back burners when possible.  Clean up spills quickly and allow time for drying.  Avoid walking on wet floors.  Avoid hot utensils and knives.  Position shelves  so they are not too high or low.  Place commonly used objects within easy reach.  If necessary, use a sturdy step stool with a grab bar when reaching.  Keep electrical cables out of the way.  Do not use floor polish or wax that makes floors slippery. If you must use wax, use non-skid floor wax.  Remove throw rugs and tripping hazards from the floor. STAIRWAYS  Never leave objects on stairs.  Place handrails on both sides of stairways and use them. Fix any loose handrails. Make sure handrails on both sides of the stairways are as long as the stairs.  Check carpeting to make sure it is firmly attached along stairs. Make repairs to worn or loose carpet promptly.  Avoid placing throw rugs at the top or bottom of stairways, or properly secure the rug with carpet tape to prevent slippage. Get rid of throw rugs, if possible.  Have an electrician put in a light switch at the top and bottom of the stairs. OTHER FALL PREVENTION TIPS  Wear low-heel or rubber-soled shoes that are supportive and fit well. Wear closed toe shoes.  When using a stepladder, make sure it is fully opened and both spreaders are firmly locked. Do not climb a closed stepladder.  Add color or contrast paint or tape to grab bars and handrails in your home. Place contrasting color strips on first and last steps.  Learn and use mobility aids as needed. Install an electrical emergency response system.  Turn on lights to avoid dark areas. Replace light bulbs that burn out immediately. Get light switches that glow.  Arrange furniture to create clear pathways. Keep furniture in the same place.  Firmly attach carpet with non-skid or double-sided tape.  Eliminate uneven floor surfaces.  Select a carpet pattern that does not visually hide the edge of steps.  Be aware of all pets. OTHER HOME SAFETY TIPS  Set the water temperature for 120 F (48.8 C).  Keep emergency numbers on or near the telephone.  Keep smoke  detectors on every level of the home and near sleeping areas. Document Released: 06/10/2002 Document Revised: 12/20/2011 Document Reviewed: 09/09/2011 [facility] Patient Information 2015 [place], [place]. This information is not intended to replace advice given to you by your health care provider. Make sure you discuss any questions you have with your health care provider.

## 2014-03-23 NOTE — ED Notes (Signed)
[facility] called for patient transport 

## 2014-03-23 NOTE — ED Notes (Signed)
Bed: WA10 Expected date: 03/23/14 Expected time: 7:23 PM Means of arrival: Ambulance Comments: Fall

## 2014-03-23 NOTE — ED Notes (Signed)
[facility] arrived for patient discharge

## 2014-04-04 ENCOUNTER — Non-Acute Institutional Stay (SKILLED_NURSING_FACILITY): Payer: BC Managed Care – PPO | Admitting: Adult Health

## 2014-04-04 DIAGNOSIS — M4806 Spinal stenosis, lumbar region: Secondary | ICD-10-CM | POA: Diagnosis not present

## 2014-04-04 DIAGNOSIS — K5900 Constipation, unspecified: Secondary | ICD-10-CM

## 2014-04-04 DIAGNOSIS — F319 Bipolar disorder, unspecified: Secondary | ICD-10-CM | POA: Diagnosis not present

## 2014-04-04 DIAGNOSIS — F329 Major depressive disorder, single episode, unspecified: Secondary | ICD-10-CM

## 2014-04-04 DIAGNOSIS — M48061 Spinal stenosis, lumbar region without neurogenic claudication: Secondary | ICD-10-CM

## 2014-04-04 DIAGNOSIS — K219 Gastro-esophageal reflux disease without esophagitis: Secondary | ICD-10-CM

## 2014-04-04 DIAGNOSIS — E039 Hypothyroidism, unspecified: Secondary | ICD-10-CM | POA: Diagnosis not present

## 2014-04-04 DIAGNOSIS — F32A Depression, unspecified: Secondary | ICD-10-CM

## 2014-04-04 LAB — CBC AND DIFFERENTIAL
HCT: 36 % (ref 36–46)
Hemoglobin: 11.6 g/dL — AB (ref 12.0–16.0)
Platelets: 133 10*3/uL — AB (ref 150–399)
WBC: 6.5 10^3/mL

## 2014-04-04 LAB — BASIC METABOLIC PANEL
BUN: 20 mg/dL (ref 4–21)
CREATININE: 0.6 mg/dL (ref 0.5–1.1)
GLUCOSE: 104 mg/dL
POTASSIUM: 3.9 mmol/L (ref 3.4–5.3)
SODIUM: 141 mmol/L (ref 137–147)

## 2014-04-04 LAB — HEMOGLOBIN A1C: Hgb A1c MFr Bld: 5.4 % (ref 4.0–6.0)

## 2014-04-04 LAB — TSH: TSH: 2.29 u[IU]/mL (ref 0.41–5.90)

## 2014-04-06 ENCOUNTER — Encounter: Payer: Self-pay | Admitting: Adult Health

## 2014-04-06 DIAGNOSIS — F32A Depression, unspecified: Secondary | ICD-10-CM | POA: Insufficient documentation

## 2014-04-06 DIAGNOSIS — F329 Major depressive disorder, single episode, unspecified: Secondary | ICD-10-CM | POA: Insufficient documentation

## 2014-04-06 DIAGNOSIS — M48061 Spinal stenosis, lumbar region without neurogenic claudication: Secondary | ICD-10-CM | POA: Insufficient documentation

## 2014-04-06 DIAGNOSIS — F319 Bipolar disorder, unspecified: Secondary | ICD-10-CM | POA: Insufficient documentation

## 2014-04-06 DIAGNOSIS — E039 Hypothyroidism, unspecified: Secondary | ICD-10-CM | POA: Insufficient documentation

## 2014-04-06 DIAGNOSIS — K219 Gastro-esophageal reflux disease without esophagitis: Secondary | ICD-10-CM | POA: Insufficient documentation

## 2014-04-06 DIAGNOSIS — K5900 Constipation, unspecified: Secondary | ICD-10-CM | POA: Insufficient documentation

## 2014-04-06 NOTE — Progress Notes (Signed)
Patient ID: Pam Jackson, female   DOB: September 16, 1940, 73 y.o.   MRN: [id number]    [facility]  Allergies  Allergen Reactions  . Valtrex [Valacyclovir Hcl] Other (See Comments)    Causes dizziness     Chief Complaint  Patient presents with  . Acute Visit    follow up transfer    HPI:  She has been transferred to this facility for long term placement from an assisted living facility. That facility felt she would benefit from a placement in a higher level of care. She is somewhat verbally inappropriate when answering questions. She apparently has had a number of falls at her previous living environment.   Past Medical History  Diagnosis Date  . Bipolar 1 disorder   . Depression   . GERD (gastroesophageal reflux disease)   . Anxiety   . Thyroid disease     hypothyroid  . Spinal stenosis   . Genital herpes   . Vitamin D deficiency     Past Surgical History  Procedure Laterality Date  . Abdominal hysterectomy    . Hammer toe surgery    . Nasal sinus surgery      VITAL SIGNS BP 128/78  Pulse 77  Ht 5' 3.5" (1.613 m)  Wt 159 lb 3.2 oz (72.213 kg)  BMI 27.76 kg/m2  SpO2 96%   Patient's Medications  New Prescriptions   No medications on file  Previous Medications   ASPIRIN 81 MG CHEWABLE TABLET    Chew 81 mg by mouth daily.   CHOLECALCIFEROL (VITAMIN D) 1000 UNITS TABLET    Take 1,000 Units by mouth daily.   DIVALPROEX (DEPAKOTE) 500 MG DR TABLET    Take 500 mg by mouth at bedtime.    DOCUSATE SODIUM (COLACE) 100 MG CAPSULE    Take 100 mg by mouth every 12 (twelve) hours.   FLUOXETINE (PROZAC) 20 MG CAPSULE    Take 20 mg by mouth daily with breakfast.   LAMOTRIGINE (LAMICTAL) 150 MG TABLET    Take 150 mg by mouth daily.    LEVOTHYROXINE (SYNTHROID, LEVOTHROID) 50 MCG TABLET    Take 50 mcg by mouth daily before breakfast.   MELOXICAM (MOBIC) 15 MG TABLET    Take 15 mg by mouth daily.   PANTOPRAZOLE (PROTONIX) 40 MG TABLET    Take 40 mg by mouth daily.   POLYETHYLENE GLYCOL (MIRALAX / GLYCOLAX) PACKET    Take 17 g by mouth daily as needed for mild constipation.   TRAMADOL (ULTRAM) 50 MG TABLET    Take 100 mg by mouth at bedtime as needed.   Modified Medications   No medications on file  Discontinued Medications   ACETAMINOPHEN (TYLENOL) 500 MG TABLET    Take 500 mg by mouth every 6 (six) hours as needed (for pain).   IBUPROFEN (ADVIL,MOTRIN) 400 MG TABLET    Take 400 mg by mouth every 6 (six) hours as needed for moderate pain.   ONDANSETRON (ZOFRAN-ODT) 4 MG DISINTEGRATING TABLET    Take 4 mg by mouth every 8 (eight) hours as needed for nausea or vomiting.   OXYCODONE-ACETAMINOPHEN (PERCOCET/ROXICET) 5-325 MG PER TABLET    Take 1-2 tablets by mouth every 4 (four) hours as needed for severe pain.    WHITE PETROLATUM (VASELINE) GEL    Apply 1 application topically 3 (three) times daily as needed (rectal dryness).    SIGNIFICANT DIAGNOSTIC EXAMS    LABS REVIEWED:   07-26-13: tsh 2.39  09-24-13: vit d 26.1  01-01-14: tsh 2.91; vit d 33.3     Review of Systems  Constitutional: Negative for malaise/fatigue.  Respiratory: Negative for cough and shortness of breath.   Cardiovascular: Negative for chest pain, palpitations and leg swelling.  Gastrointestinal: Negative for heartburn, abdominal pain and constipation.  Musculoskeletal: Negative for joint pain and myalgias.  Skin: Negative.   Psychiatric/Behavioral: Negative for depression. The patient is not nervous/anxious.       Physical Exam  Constitutional: She appears well-developed and well-nourished. No distress.  Neck: Neck supple. No JVD present.  Cardiovascular: Normal rate, regular rhythm and intact distal pulses.   Respiratory: Effort normal and breath sounds normal. No respiratory distress. She has no wheezes.  GI: Soft. Bowel sounds are normal. She exhibits no distension. There is no tenderness.  Musculoskeletal: Normal range of motion. She exhibits no edema.  Neurological:  She is alert.  Is in appropriate at times with her verbal responses   Skin: Skin is warm and dry. She is not diaphoretic.      ASSESSMENT/ PLAN:  1. Hypothyroidism: will continue synthroid 50 mcg daily and will monitor   2. Genella RifeGerd; will continue protonix 40 mg daily   3. Lumbar stenosis: is stable will continue mobic 15 mg daily; and ultram 100 mg nightly as needed will monitor   4. Constipation; will continue colace twice daily and miralax daily as needed  5. Bipolar disease: she is presently stable will continue lamictal 150 mg in the am and depakote 500 mg nightly  6. Depression: will continue prozac 20 mg daily and will monitor   Will check cbc; cmp; tsh; hgb a1c; depakote level   Time spent with patient 50 minutes.    Synthia Innocenteborah Green NP [facility]  Contact [phone] Monday through Friday 8am- 5pm  After hours call [phone]

## 2014-04-07 ENCOUNTER — Encounter: Payer: Self-pay | Admitting: Internal Medicine

## 2014-04-07 ENCOUNTER — Non-Acute Institutional Stay (SKILLED_NURSING_FACILITY): Payer: BC Managed Care – PPO | Admitting: Internal Medicine

## 2014-04-07 DIAGNOSIS — F319 Bipolar disorder, unspecified: Secondary | ICD-10-CM

## 2014-04-07 DIAGNOSIS — M48061 Spinal stenosis, lumbar region without neurogenic claudication: Secondary | ICD-10-CM

## 2014-04-07 DIAGNOSIS — E039 Hypothyroidism, unspecified: Secondary | ICD-10-CM

## 2014-04-07 DIAGNOSIS — K5900 Constipation, unspecified: Secondary | ICD-10-CM

## 2014-04-07 DIAGNOSIS — M4806 Spinal stenosis, lumbar region: Secondary | ICD-10-CM

## 2014-04-07 DIAGNOSIS — K219 Gastro-esophageal reflux disease without esophagitis: Secondary | ICD-10-CM

## 2014-04-07 DIAGNOSIS — R4189 Other symptoms and signs involving cognitive functions and awareness: Secondary | ICD-10-CM

## 2014-04-07 DIAGNOSIS — M79672 Pain in left foot: Secondary | ICD-10-CM

## 2014-04-07 NOTE — Progress Notes (Signed)
Patient ID: Pam Jackson, female   DOB: [birth year], 73 y.o.   MRN: [id number]     Facility: [facility]    PCP: Sissy Hoff, MD  Code Status: full code  Allergies  Allergen Reactions  . Valtrex [Valacyclovir Hcl] Other (See Comments)    Causes dizziness    Chief Complaint: new admission  HPI:  73 y/o female patient is here for long term care. She was residing in assisted living facility prior to this. She has history of bipolar disorder, depression, constipation, gerd. She appears to have cognitive impairment while taking history and obtaining ROS from her. Complaints of pain in left foot for few days  Review of Systems:  Constitutional: Negative for fever, chills, malaise/fatigue HENT: Negative for congestion  Eyes: Negative for eye pain, blurred vision Respiratory: Negative for cough, sputum production, shortness of breath and wheezing.   Cardiovascular: Negative for chest pain, palpitations Gastrointestinal: Negative for heartburn, nausea, vomiting, abdominal pain  Genitourinary: Negative for dysuria Musculoskeletal: Negative for falls Skin: Negative for itching and rash.  Neurological: Negative for weakness and headaches.  Psychiatric/Behavioral: Negative for depression      Past Medical History  Diagnosis Date  . Bipolar 1 disorder   . Depression   . GERD (gastroesophageal reflux disease)   . Anxiety   . Thyroid disease     hypothyroid  . Spinal stenosis   . Genital herpes   . Vitamin D deficiency    Past Surgical History  Procedure Laterality Date  . Abdominal hysterectomy    . Hammer toe surgery    . Nasal sinus surgery     Social History:   reports that she has never smoked. She has never used smokeless tobacco. She reports that she does not drink alcohol or use illicit drugs.  Family History  Problem Relation Age of Onset  . Heart disease Father   . Hyperlipidemia Father   . Cancer Sister   . Hyperlipidemia  Sister   . Hyperlipidemia Brother   . Mental illness Brother     Medications: Patient's Medications  New Prescriptions   No medications on file  Previous Medications   ASPIRIN 81 MG CHEWABLE TABLET    Chew 81 mg by mouth daily.   CHOLECALCIFEROL (VITAMIN D) 1000 UNITS TABLET    Take 1,000 Units by mouth daily.   DIVALPROEX (DEPAKOTE) 500 MG DR TABLET    Take 500 mg by mouth at bedtime.    DOCUSATE SODIUM (COLACE) 100 MG CAPSULE    Take 100 mg by mouth every 12 (twelve) hours.   FLUOXETINE (PROZAC) 20 MG CAPSULE    Take 20 mg by mouth daily with breakfast.   LAMOTRIGINE (LAMICTAL) 150 MG TABLET    Take 150 mg by mouth daily.    LEVOTHYROXINE (SYNTHROID, LEVOTHROID) 50 MCG TABLET    Take 50 mcg by mouth daily before breakfast.   MELOXICAM (MOBIC) 15 MG TABLET    Take 15 mg by mouth daily.   PANTOPRAZOLE (PROTONIX) 40 MG TABLET    Take 40 mg by mouth daily.   POLYETHYLENE GLYCOL (MIRALAX / GLYCOLAX) PACKET    Take 17 g by mouth daily as needed for mild constipation.   TRAMADOL (ULTRAM) 50 MG TABLET    Take 100 mg by mouth at bedtime as needed.   Modified Medications   No medications on file  Discontinued Medications   No medications on file     Physical Exam: Filed Vitals:   04/07/14  1404  BP: 114/57  Pulse: 73  Temp: 96.3 F (35.7 C)  Resp: 18  SpO2: 96%   General- elderly female in no acute distress Head- atraumatic, normocephalic Eyes-no pallor, no icterus, no discharge Neck- no lymphadenopathy Throat- moist mucus membrane Cardiovascular- normal s1,s2, no murmurs Respiratory- bilateral clear to auscultation, no wheeze, no rhonchi, no crackles, no use of accessory muscles Abdomen- bowel sounds present, soft, non tender Musculoskeletal- able to move all 4 extremities, no leg edema Neurological- alert but not oriented Skin- warm and dry Psychiatry- calm this visit   Labs reviewed: Basic Metabolic Panel:  Recent Labs  16/04/9605/07/15 1300 02/16/14 0733 03/14/14 2117    NA 142 143 146  K 3.9 4.3 4.8  CL 102 104 106  CO2 26 27 27   GLUCOSE 94 124* 94  BUN 16 26* 20  CREATININE 0.86 0.89 0.76  CALCIUM 9.1 9.1 9.4   Liver Function Tests:  Recent Labs  11/25/13 1025 12/08/13 1300 03/14/14 2117  AST 12 13 13   ALT 8 9 10   ALKPHOS 49 47 64  BILITOT 0.3 0.2* 0.2*  PROT 6.4 6.0 6.3  ALBUMIN 3.7 3.6 3.4*    Recent Labs  03/14/14 2117  LIPASE 16    Recent Labs  09/13/13 0443  AMMONIA 34   CBC:  Recent Labs  12/08/13 1300 02/16/14 0733 03/14/14 2117  WBC 6.1 7.3 6.6  NEUTROABS 2.8 3.9 3.2  HGB 12.5 13.9 13.0  HCT 36.5 41.9 38.7  MCV 93.6 96.8 95.8  PLT 174 166 169   Lab Results  Component Value Date   TSH 3.806 09/13/2013   04/04/14 wbc 6.5, hb 11.6, hct 36.4, plt 133, a1c 5.4, na 141, k 3.9, glu 104, bun 20, cr 0.6, ca 8.8, alb 3.4, lft wnl, depakote level 93.4, tsh 2.29  Assessment/Plan  Left foot pain Has hx of OA and lumbar stenosis. On meloxicam and tramadol. Will have her on naproxyn 500 mg bid for 5 days and reassess. Continue vit d  Hypothyroidism Reviewed tsh. Continue synthroid 50 mcg daily    Vit d def Continue vit d supplement  Genella RifeGerd continue protonix 40 mg daily   Lumbar stenosis continue mobic 15 mg daily and tramadol 100 mg qhs prn  Constipation continue colace twice daily and miralax daily as needed  Bipolar disorder Mood stable currently. continue lamictal 150 mg daily and depakote 500 mg daily. Continue prozac and monitor her mood. Consider GDR if possible on routine visit  Cognitive impairment Has hx of hypothyroidism and bipolar disorder which could contribute some over time. Will check b12 and get formal MMSE for further evaluation   Family/ staff Communication: reviewed care plan with patient and nursing supervisor   Goals of care: long term care   Labs/tests ordered: MMSE    Oneal GroutMAHIMA Gustav Knueppel, MD  [facility] [phone] (Monday-Friday 8 am - 5 pm) [phone]  (afterhours)

## 2014-05-06 ENCOUNTER — Encounter: Payer: Self-pay | Admitting: Adult Health

## 2014-05-06 ENCOUNTER — Non-Acute Institutional Stay (SKILLED_NURSING_FACILITY): Payer: BC Managed Care – PPO | Admitting: Adult Health

## 2014-05-06 DIAGNOSIS — M4806 Spinal stenosis, lumbar region: Secondary | ICD-10-CM | POA: Diagnosis not present

## 2014-05-06 DIAGNOSIS — F319 Bipolar disorder, unspecified: Secondary | ICD-10-CM

## 2014-05-06 DIAGNOSIS — E039 Hypothyroidism, unspecified: Secondary | ICD-10-CM | POA: Diagnosis not present

## 2014-05-06 DIAGNOSIS — K219 Gastro-esophageal reflux disease without esophagitis: Secondary | ICD-10-CM

## 2014-05-06 DIAGNOSIS — K5900 Constipation, unspecified: Secondary | ICD-10-CM | POA: Diagnosis not present

## 2014-05-06 DIAGNOSIS — N390 Urinary tract infection, site not specified: Secondary | ICD-10-CM | POA: Diagnosis not present

## 2014-05-06 DIAGNOSIS — M48061 Spinal stenosis, lumbar region without neurogenic claudication: Secondary | ICD-10-CM

## 2014-05-06 NOTE — Progress Notes (Signed)
[facility]   Code Status: Full  Allergies  Allergen Reactions  . Valtrex [Valacyclovir Hcl] Other (See Comments)    Causes dizziness    Chief Complaint: Medical Management of Chronic Health Issues  HPI:  Ms Pam Jackson is a 73 yr old female being seen today for routine visit. She is a fairly recent patient to facility. Overall she is adjusting well.  No concerns reported by patient or staff. She is currently being treated for UTI. She is displays a flat/withdrawn affect during conversation. She does not answer all questions appropriately. She does not appear in any distress at this time.   Review of Systems:  Constitutional: Negative for fever, chills, weight loss, malaise/fatigue and diaphoresis.  Respiratory: Negative for cough, sputum production, shortness of breath and wheezing.   Cardiovascular: Negative for chest pain, palpitations, and leg swelling.  Gastrointestinal: Negative for heartburn, nausea, vomiting, abdominal pain, diarrhea and constipation.  Genitourinary: Negative for dysuria, urgency, frequency, hematuria and flank pain.  Musculoskeletal: Negative for back pain, falls, joint pain and myalgias.  Skin: Negative for itching and rash.  Neurological: Negative for weakness,dizziness, tingling, focal weakness and headaches.  Psychiatric/Behavioral: Patient not nervous or anxious.    Past Medical History  Diagnosis Date  . Bipolar 1 disorder   . Depression   . GERD (gastroesophageal reflux disease)   . Anxiety   . Thyroid disease     hypothyroid  . Spinal stenosis   . Genital herpes   . Vitamin D deficiency    Past Surgical History  Procedure Laterality Date  . Abdominal hysterectomy    . Hammer toe surgery    . Nasal sinus surgery     Social History:   reports that she has never smoked. She has never used smokeless tobacco. She reports that she does not drink alcohol or use illicit drugs.  Family History  Problem Relation Age of Onset  . Heart  disease Father   . Hyperlipidemia Father   . Cancer Sister   . Hyperlipidemia Sister   . Hyperlipidemia Brother   . Mental illness Brother     Medications: Patient's Medications  New Prescriptions   No medications on file  Previous Medications   ASPIRIN 81 MG CHEWABLE TABLET    Chew 81 mg by mouth daily.   CHOLECALCIFEROL (VITAMIN D) 1000 UNITS TABLET    Take 1,000 Units by mouth daily.   DIVALPROEX (DEPAKOTE) 500 MG DR TABLET    Take 500 mg by mouth at bedtime.    DOCUSATE SODIUM (COLACE) 100 MG CAPSULE    Take 100 mg by mouth every 12 (twelve) hours.   FLUOXETINE (PROZAC) 20 MG CAPSULE    Take 20 mg by mouth daily with breakfast.   LAMOTRIGINE (LAMICTAL) 150 MG TABLET    Take 150 mg by mouth daily.    LEVOTHYROXINE (SYNTHROID, LEVOTHROID) 50 MCG TABLET    Take 50 mcg by mouth daily before breakfast.   MELOXICAM (MOBIC) 15 MG TABLET    Take 15 mg by mouth daily.   PANTOPRAZOLE (PROTONIX) 40 MG TABLET    Take 40 mg by mouth daily.   POLYETHYLENE GLYCOL (MIRALAX / GLYCOLAX) PACKET    Take 17 g by mouth daily as needed for mild constipation.   TRAMADOL (ULTRAM) 50 MG TABLET    Take 100 mg by mouth at bedtime as needed.   Modified Medications   No medications on file  Discontinued Medications   No medications on file  Physical Exam:  Filed Vitals:   05/06/14 1030  BP: 136/65  Pulse: 70  Temp: 94.4 F (34.7 C)  Resp: 20  SpO2: 95%    General- elderly female in no acute distress Neck- no lymphadenopathy, no thyromegaly, no jugular vein distension, no carotid bruit Cardiovascular- normal s1,s2, no murmurs/ rubs/ gallops; no leg edema Respiratory- bilateral clear to auscultation, no wheeze, no rhonchi, no crackles, no use of accessory muscles Abdomen- bowel sounds present, soft, non tender, no organomegaly, non-distended Musculoskeletal- able to move all 4 extremities, normal ROM Neurological- she is alert Skin- warm and dry Psychiatry- alert; flat  affect   SIGNIFICANT DIAGNOSTIC EXAMS    LABS REVIEWED:   07-26-13: tsh 2.39  09-24-13: vit d 26.1  01-01-14: tsh 2.91; vit d 33.3 04-04-14: wbc 6.5; hgb 11.6; hct 36.4; plt 133; na 141; k 3.9; glucose 104; bun 20; creat 0.6; tsh 2.29 04-30-14: UA positive for E.coli; vit B12 288;     ASSESSMENT/ PLAN:  1. Hypothyroidism: stable; TSH 2.29; will continue synthroid 50 mcg daily and will monitor   2. GERD: no issues;  will continue protonix 40 mg daily   3. Lumbar stenosis: is stable will continue mobic 15 mg daily; and ultram 100 mg nightly as needed will monitor   4. Constipation: no issues; will continue colace twice daily and miralax daily as needed  5. Bipolar disease: she is presently stable; will continue lamictal 150 mg in the am and depakote 500 mg nightly  6. Depression: no issues; will continue prozac 20 mg daily and will monitor   7. Cognitive impairment: continues; MMSE 22/30; will continue to monitor  8. UTI: positive E.coli; this is currently being treated with course of Cipro; pt displays no concerns at this time; will monitor  Keatts, Jacques Earthlyourtney S, Student-NP   Synthia Innocenteborah Genessa Beman NP [facility]  Contact [phone] Monday through Friday 8am- 5pm  After hours call [phone]

## 2014-06-03 ENCOUNTER — Encounter: Payer: Self-pay | Admitting: Registered Nurse

## 2014-06-03 ENCOUNTER — Non-Acute Institutional Stay (SKILLED_NURSING_FACILITY): Payer: BC Managed Care – PPO | Admitting: Registered Nurse

## 2014-06-03 DIAGNOSIS — F319 Bipolar disorder, unspecified: Secondary | ICD-10-CM

## 2014-06-03 DIAGNOSIS — S31801A Laceration without foreign body of unspecified buttock, initial encounter: Secondary | ICD-10-CM

## 2014-06-03 DIAGNOSIS — K219 Gastro-esophageal reflux disease without esophagitis: Secondary | ICD-10-CM

## 2014-06-03 DIAGNOSIS — K5900 Constipation, unspecified: Secondary | ICD-10-CM

## 2014-06-03 DIAGNOSIS — M48061 Spinal stenosis, lumbar region without neurogenic claudication: Secondary | ICD-10-CM

## 2014-06-03 DIAGNOSIS — F32A Depression, unspecified: Secondary | ICD-10-CM

## 2014-06-03 DIAGNOSIS — F329 Major depressive disorder, single episode, unspecified: Secondary | ICD-10-CM

## 2014-06-03 DIAGNOSIS — M4806 Spinal stenosis, lumbar region: Secondary | ICD-10-CM

## 2014-06-03 DIAGNOSIS — E039 Hypothyroidism, unspecified: Secondary | ICD-10-CM

## 2014-06-03 NOTE — Progress Notes (Signed)
Patient ID: Pam Jackson, female   DOB: [birth year], 73 y.o.   MRN: [id number]   Place of Service: [facility]  Allergies  Allergen Reactions  . Valtrex [Valacyclovir Hcl] Other (See Comments)    Causes dizziness    Code Status: Full Code  Goals of Care: Longevity/LTC  Chief Complaint  Patient presents with  . Medical Management of Chronic Issues    hypothyroidism, depression, bipolar, constipation, lumbar stenosis, gerd    HPI 73 y.o. female with PMH of bipolar disorder, depression, hypothyroidism, GERD among others is being seen for a routine visit. Weight stable. No recent fall. o change in behaviors or functional status reported. Patient has a small skin tear on at the top midline of her buttocks. Otherwise, no concerns from staff.   Review of Systems Constitutional: Negative for fever, chills, and fatigue. HENT: Negative for ear pain, congestion, and sore throat Eyes: Negative for eye pain, eye discharge, and visual disturbance  Cardiovascular: Negative for chest pain, palpitations, and leg swelling Respiratory: Negative cough, shortness of breath, and wheezing.  Gastrointestinal: Negative for nausea and vomiting. Negative for abdominal pain, diarrhea and constipation.  Genitourinary: Negative for  dysuria, frequency, urgency, and hematuria Musculoskeletal: Positive for back pain  Neurological: Negative for dizziness and weakness  Skin: Negative for rash.   Psychiatric: Positive for depression (no better or worse). Negative for suicidal ideations  Past Medical History  Diagnosis Date  . Bipolar 1 disorder   . Depression   . GERD (gastroesophageal reflux disease)   . Anxiety   . Thyroid disease     hypothyroid  . Spinal stenosis   . Genital herpes   . Vitamin D deficiency     Past Surgical History  Procedure Laterality Date  . Abdominal hysterectomy    . Hammer toe surgery    . Nasal sinus surgery      History   Social History  . Marital  Status: Single    Spouse Name: N/A    Number of Children: N/A  . Years of Education: N/A   Occupational History  . Not on file.   Social History Main Topics  . Smoking status: Never Smoker   . Smokeless tobacco: Never Used  . Alcohol Use: No  . Drug Use: No  . Sexual Activity: Not Currently   Other Topics Concern  . Not on file   Social History Narrative      Medication List       This list is accurate as of: 06/03/14 11:37 PM.  Always use your most recent med list.               aspirin 81 MG chewable tablet  Chew 81 mg by mouth daily.     cholecalciferol 1000 UNITS tablet  Commonly known as:  VITAMIN D  Take 1,000 Units by mouth daily.     divalproex 500 MG DR tablet  Commonly known as:  DEPAKOTE  Take 500 mg by mouth at bedtime.     docusate sodium 100 MG capsule  Commonly known as:  COLACE  Take 100 mg by mouth every 12 (twelve) hours.     FLUoxetine 20 MG capsule  Commonly known as:  PROZAC  Take 20 mg by mouth daily with breakfast.     lamoTRIgine 150 MG tablet  Commonly known as:  LAMICTAL  Take 150 mg by mouth daily.     levothyroxine 50 MCG tablet  Commonly known as:  SYNTHROID, LEVOTHROID  Take 50 mcg by mouth daily before breakfast.     meloxicam 15 MG tablet  Commonly known as:  MOBIC  Take 15 mg by mouth daily.     pantoprazole 40 MG tablet  Commonly known as:  PROTONIX  Take 40 mg by mouth daily.     phenylephrine-shark liver oil-mineral oil-petrolatum 0.25-3-14-71.9 % rectal ointment  Commonly known as:  PREPARATION H  Place 1 application rectally 2 (two) times daily as needed for hemorrhoids.     polyethylene glycol packet  Commonly known as:  MIRALAX / GLYCOLAX  Take 17 g by mouth daily as needed for mild constipation.     traMADol 50 MG tablet  Commonly known as:  ULTRAM  Take 100 mg by mouth at bedtime as needed.        Physical Exam Filed Vitals:   06/03/14 2314  BP: 108/62  Pulse: 88  Temp: 98.3 F (36.8 C)    Resp: 18   Constitutional: WDWN elderly female in no acute distress. Conversant.  HEENT: Normocephalic and atraumatic. PERRL. EOM intact. No icterus. Oral mucosa moist. Posterior pharynx clear of any exudate or lesions.  Neck: Supple and nontender. No lymphadenopathy, masses, or thyromegaly. No JVD or carotid bruits. Cardiac: Normal S1, S2. RRR without appreciable murmurs, rubs, or gallops. Distal pulses intact. No dependent edema.  Lungs: No respiratory distress. Breath sounds clear bilaterally without rales, rhonchi, or wheezes. Abdomen: Audible bowel sounds in all quadrants. Soft, nontender, nondistended.  Musculoskeletal: Able to move all extremities.  Skin: Warm and dry.  Neurological: Alert and oriented to person. Psychiatric: Judgment and insight adequate. Appropriate mood and affect.   Labs Reviewed  CBC Latest Ref Rng 04/04/2014 03/14/2014 02/16/2014  WBC - 6.5 6.6 7.3  Hemoglobin 12.0 - 16.0 g/dL 11.6(A) 13.0 13.9  Hematocrit 36 - 46 % 36 38.7 41.9  Platelets 150 - 399 K/L 133(A) 169 166    CMP Latest Ref Rng 04/04/2014 03/14/2014 02/16/2014  Glucose 70 - 99 mg/dL - 94 409(W124(H)  BUN 4 - 21 mg/dL 20 20 11(B26(H)  Creatinine 0.5 - 1.1 mg/dL 0.6 1.470.76 8.290.89  Sodium 562137 - 147 mmol/L 141 146 143  Potassium 3.4 - 5.3 mmol/L 3.9 4.8 4.3  Chloride 96 - 112 mEq/L - 106 104  CO2 19 - 32 mEq/L - 27 27  Calcium 8.4 - 10.5 mg/dL - 9.4 9.1  Total Protein 6.0 - 8.3 g/dL - 6.3 -  Total Bilirubin 0.3 - 1.2 mg/dL - 1.3(Y0.2(L) -  Alkaline Phos 39 - 117 U/L - 64 -  AST 0 - 37 U/L - 13 -  ALT 0 - 35 U/L - 10 -    Lab Results  Component Value Date   TSH 2.29 04/04/2014   Lab Results  Component Value Date   HGBA1C 5.4 04/04/2014   Lab Results  Component Value Date   VALPROATE 94.2 02/16/2014   04/04/14: depakote level 93.4  Assessment & Plan 1. Hypothyroidism, unspecified hypothyroidism type Stable. TSH 2.29. Continue synthroid 50mcg daily and monitor  2. Constipation, unspecified  constipation type Stable. Continue colace 100mg  twice daily and miralax daily as needed. Continue to monitor.   3. Gastroesophageal reflux disease, esophagitis presence not specified Stable. Continue protonix 40mg  daily and monitor. Asymptomatic but would like to remain on ppi at this time.   4. Lumbar stenosis without neurogenic claudication Stable. Continue tramadol 100mg  nightly as needed and mobic 15mg  daily. Continue to monitor.   5. Bipolar affective disorder, most recent episode  unspecified type, remission status unspecified Stable. Continue lamictal 150mg  daily and depakote 500mg  daily and monitor for change in mood and behaviors  6. Depression Stable. Continue prozac 20mg  daily. Psychotherapy recently initiated. Continue to monitor for change in mood  7. Tear of skin of buttock, unspecified laterality, initial encounter No signs of infections. Continue skin care and monitor   Family/Staff Communication Plan of care discussed with resident and nursing staff. Resident and nursing staff verbalized understanding and agree with plan of care. No additional questions or concerns reported.    Loura BackKim Darik Massing, MSN, AGNP-C [facility] [address] [place], [place] [id number] [phone] [8am-5pm] After hours: [phone]

## 2014-06-05 LAB — LIPID PANEL
CHOLESTEROL: 188 mg/dL (ref 0–200)
HDL: 47 mg/dL (ref 35–70)
LDL Cholesterol: 112 mg/dL
TRIGLYCERIDES: 147 mg/dL (ref 40–160)

## 2014-07-07 ENCOUNTER — Non-Acute Institutional Stay (SKILLED_NURSING_FACILITY): Payer: Medicare Other | Admitting: Registered Nurse

## 2014-07-07 ENCOUNTER — Encounter: Payer: Self-pay | Admitting: Registered Nurse

## 2014-07-07 DIAGNOSIS — F329 Major depressive disorder, single episode, unspecified: Secondary | ICD-10-CM

## 2014-07-07 DIAGNOSIS — E039 Hypothyroidism, unspecified: Secondary | ICD-10-CM | POA: Diagnosis not present

## 2014-07-07 DIAGNOSIS — M48061 Spinal stenosis, lumbar region without neurogenic claudication: Secondary | ICD-10-CM

## 2014-07-07 DIAGNOSIS — F319 Bipolar disorder, unspecified: Secondary | ICD-10-CM

## 2014-07-07 DIAGNOSIS — R05 Cough: Secondary | ICD-10-CM | POA: Diagnosis not present

## 2014-07-07 DIAGNOSIS — R058 Other specified cough: Secondary | ICD-10-CM

## 2014-07-07 DIAGNOSIS — M4806 Spinal stenosis, lumbar region: Secondary | ICD-10-CM

## 2014-07-07 DIAGNOSIS — K5900 Constipation, unspecified: Secondary | ICD-10-CM

## 2014-07-07 DIAGNOSIS — E559 Vitamin D deficiency, unspecified: Secondary | ICD-10-CM

## 2014-07-07 DIAGNOSIS — F32A Depression, unspecified: Secondary | ICD-10-CM

## 2014-07-07 DIAGNOSIS — K219 Gastro-esophageal reflux disease without esophagitis: Secondary | ICD-10-CM

## 2014-07-07 NOTE — Progress Notes (Signed)
Patient ID: Pam Jackson, female   DOB: 01-05-1941, 74 y.o.   MRN: [id number]   Place of Service: [facility]  Allergies  Allergen Reactions  . Valtrex [Valacyclovir Hcl] Other (See Comments)    Causes dizziness    Code Status: Full Code  Goals of Care: Longevity/LTC  Chief Complaint  Patient presents with  . Medical Management of Chronic Issues    bipolar, hypothyroidism, vit D def, lumbar stenosis, constipation, GERD, depression    HPI 74 y.o. female with PMH of bipolar disorder, depression, hypothyroidism, GERD among others is being seen for a routine visit for management of her chronic issues. Weight overall stable over the past month. No recent fall or skin concerns reported. No change in behaviors or functional status reported. Hypothyroidism is stable on current dose of levothyroxine. GERD is stable on protonix. Vit D level 25 n 06/17/14-currently on 50,000 units of vit D weekly x 4 weeks, has 2 weeks left then vit D 2000 units daily. Lumbar stenosis is stable on tramadol prn, mobic daily, and ibuprofen prn. Constipation stable on colace and prn miralax. Seen in bed today. No complaints verbalized beside "ready to get out of here." Per nursing staff, patient has been coughing for the past several days. She was on Robitussin for a couple days with some improvement. Otherwise, no concerns reported   Review of Systems Constitutional: Negative for fever, chills, and fatigue. HENT: Negative for ear pain, congestion, and sore throat Eyes: Negative for eye pain, eye discharge, and visual disturbance  Cardiovascular: Negative for chest pain, palpitations, and leg swelling Respiratory: Positive for productive cough. Negative for shortness of breath and wheezing.  Gastrointestinal: Negative for nausea and vomiting. Negative for abdominal pain, diarrhea and constipation.  Genitourinary: Negative for  dysuria, frequency, urgency, and hematuria Musculoskeletal: Negative for  back pain, joint pain, and joint swelling  Neurological: Negative for dizziness and weakness  Skin: Negative for rash.   Psychiatric: Positive for depression (no better or worse). Negative for suicidal ideations  Past Medical History  Diagnosis Date  . Bipolar 1 disorder   . Depression   . GERD (gastroesophageal reflux disease)   . Anxiety   . Thyroid disease     hypothyroid  . Spinal stenosis   . Genital herpes   . Vitamin D deficiency     Past Surgical History  Procedure Laterality Date  . Abdominal hysterectomy    . Hammer toe surgery    . Nasal sinus surgery      History   Social History  . Marital Status: Single    Spouse Name: N/A    Number of Children: N/A  . Years of Education: N/A   Occupational History  . Not on file.   Social History Main Topics  . Smoking status: Never Smoker   . Smokeless tobacco: Never Used  . Alcohol Use: No  . Drug Use: No  . Sexual Activity: Not Currently   Other Topics Concern  . Not on file   Social History Narrative      Medication List       This list is accurate as of: 07/07/14  9:52 PM.  Always use your most recent med list.               aspirin 81 MG chewable tablet  Chew 81 mg by mouth daily.     benzonatate 100 MG capsule  Commonly known as:  TESSALON  Take by mouth 3 (three) times daily  as needed for cough.     cholecalciferol 1000 UNITS tablet  Commonly known as:  VITAMIN D  Take 2,000 Units by mouth daily.     divalproex 500 MG DR tablet  Commonly known as:  DEPAKOTE  Take 500 mg by mouth at bedtime.     docusate sodium 100 MG capsule  Commonly known as:  COLACE  Take 100 mg by mouth every 12 (twelve) hours.     FLUoxetine 20 MG capsule  Commonly known as:  PROZAC  Take 30 mg by mouth daily with breakfast.     guaiFENesin-dextromethorphan 100-10 MG/5ML syrup  Commonly known as:  ROBITUSSIN DM  Take 10 mLs by mouth every 6 (six) hours as needed for cough.     ibuprofen 400 MG tablet    Commonly known as:  ADVIL,MOTRIN  Take 400 mg by mouth every 8 (eight) hours as needed for mild pain or moderate pain.     lamoTRIgine 150 MG tablet  Commonly known as:  LAMICTAL  Take 150 mg by mouth daily.     levothyroxine 50 MCG tablet  Commonly known as:  SYNTHROID, LEVOTHROID  Take 50 mcg by mouth daily before breakfast.     meloxicam 15 MG tablet  Commonly known as:  MOBIC  Take 15 mg by mouth daily.     pantoprazole 40 MG tablet  Commonly known as:  PROTONIX  Take 40 mg by mouth daily.     phenylephrine-shark liver oil-mineral oil-petrolatum 0.25-3-14-71.9 % rectal ointment  Commonly known as:  PREPARATION H  Place 1 application rectally 2 (two) times daily as needed for hemorrhoids.     polyethylene glycol packet  Commonly known as:  MIRALAX / GLYCOLAX  Take 17 g by mouth daily as needed for mild constipation.     traMADol 50 MG tablet  Commonly known as:  ULTRAM  Take 100 mg by mouth at bedtime as needed.        Physical Exam Filed Vitals:   07/07/14 2124  BP: 124/70  Pulse: 72  Temp: 97.6 F (36.4 C)  Resp: 18   Constitutional: WDWN elderly female in no acute distress. Conversant.  HEENT: Normocephalic and atraumatic. PERRL. EOM intact. No icterus. Oral mucosa moist. Posterior pharynx clear of any exudate or lesions.  Neck: Supple and nontender. No lymphadenopathy, masses, or thyromegaly. No JVD or carotid bruits. Cardiac: Normal S1, S2. RRR without appreciable murmurs, rubs, or gallops. Distal pulses intact. No dependent edema.  Lungs: No respiratory distress. Breath sounds coarse bilaterally without rales, rhonchi, or wheezes. Abdomen: Audible bowel sounds in all quadrants. Soft, nontender, nondistended.  Musculoskeletal: Able to move all extremities.  Skin: Warm and dry.  Neurological: Alert and oriented to self Psychiatric: Flat affect  Labs Reviewed  CBC Latest Ref Rng 04/04/2014 03/14/2014 02/16/2014  WBC - 6.5 6.6 7.3  Hemoglobin 12.0 - 16.0  g/dL 11.6(A) 13.0 13.9  Hematocrit 36 - 46 % 36 38.7 41.9  Platelets 150 - 399 K/L 133(A) 169 166    CMP Latest Ref Rng 04/04/2014 03/14/2014 02/16/2014  Glucose 70 - 99 mg/dL - 94 161(W)  BUN 4 - 21 mg/dL 20 20 96(E)  Creatinine 0.5 - 1.1 mg/dL 0.6 4.54 0.98  Sodium 119 - 147 mmol/L 141 146 143  Potassium 3.4 - 5.3 mmol/L 3.9 4.8 4.3  Chloride 96 - 112 mEq/L - 106 104  CO2 19 - 32 mEq/L - 27 27  Calcium 8.4 - 10.5 mg/dL - 9.4 9.1  Total Protein  6.0 - 8.3 g/dL - 6.3 -  Total Bilirubin 0.3 - 1.2 mg/dL - 4.0(J) -  Alkaline Phos 39 - 117 U/L - 64 -  AST 0 - 37 U/L - 13 -  ALT 0 - 35 U/L - 10 -    Lab Results  Component Value Date   TSH 2.29 04/04/2014   Lab Results  Component Value Date   HGBA1C 5.4 04/04/2014   Lab Results  Component Value Date   VALPROATE 94.2 02/16/2014   04/04/14: depakote level 93.4  06/17/14 Vit D 25  Assessment & Plan 1. Hypothyroidism, unspecified hypothyroidism type Stable. last TSH 2.29. Continue synthroid daily and monitor  2. Constipation, unspecified constipation type Stable. Continue colace  twice daily and miralax daily as needed. Continue to monitor.   3. Gastroesophageal reflux disease, esophagitis presence not specified Stable. Continue protonix  daily and monitor.   4. Lumbar stenosis without neurogenic claudication Stable. Continue tramadol  nightly as needed, ibuprofen  three times daily as needed, and mobic  daily. Continue to monitor.   5. Bipolar affective disorder, most recent episode unspecified type, remission status unspecified Followed by psy. Continue lamictal  daily and depakote  daily and monitor for change in mood and behaviors  6. Depression Followed by psy. Continue prozac  daily.  Continue to monitor for change in mood  7. Vit D deficiency Vit D 50,000 units once weekly x 4 weeks was ordered-has 2 doses left. Continue remaining doses of VitD 50,000 units once weekly then  vit D 2000 units daily. Recheck Vit D level 07/21/13. Continue to monitor her status  8. Cough Robitussin DM 10mL every six hours x 2 days then every six hours as needed with tessalon perles  every 8 hours as needed for cough. Continue to monitor.   Labs ordered: vit D 07/21/13  Family/Staff Communication Plan of care discussed with resident and nursing staff. Resident and nursing staff verbalized understanding and agree with plan of care. No additional questions or concerns reported.    Loura Back, MSN, AGNP-C [facility] [address] [place], [place] [address] [phone] [8am-5pm] After hours: [phone]

## 2014-07-22 ENCOUNTER — Other Ambulatory Visit: Payer: Self-pay | Admitting: *Deleted

## 2014-07-22 ENCOUNTER — Non-Acute Institutional Stay (SKILLED_NURSING_FACILITY): Payer: Medicare Other | Admitting: Registered Nurse

## 2014-07-22 DIAGNOSIS — S39012A Strain of muscle, fascia and tendon of lower back, initial encounter: Secondary | ICD-10-CM

## 2014-07-22 MED ORDER — TRAMADOL HCL 50 MG PO TABS: ORAL_TABLET | ORAL | Status: DC

## 2014-07-22 NOTE — Telephone Encounter (Signed)
[facility] 

## 2014-07-23 ENCOUNTER — Encounter: Payer: Self-pay | Admitting: Registered Nurse

## 2014-07-23 NOTE — Progress Notes (Signed)
Patient ID: Pam Jackson, female   DOB: 1940/11/02, 74 y.o.   MRN: [id number]   Place of Service: [facility]  Allergies  Allergen Reactions  . Valtrex [Valacyclovir Hcl] Other (See Comments)    Causes dizziness    Code Status: Full Code  Goals of Care: Longevity/LTC  Chief Complaint  Patient presents with  . Acute Visit    "pullled back"    HPI 74 y.o. female with PMH of bipolar disorder, depression, hypothyroidism, GERD, lumbar stenosis among others is being seen for an acute visit for "pulled back". Patient reported having localized lower back pain and spasms x 1 day with some relief from prn ibuprofen. Pain is worse with movement. She believes she has pulled her back muscles. Has not rest well since onset of back pain. Would like to have something else to help with pain relief. Denies any injury.   Review of Systems Constitutional: Negative for fever and chills HENT: Negative for ear pain, congestion, and sore throat Eyes: Negative for eye pain, eye discharge, and visual disturbance  Cardiovascular: Negative for chest pain, palpitations, and leg swelling Respiratory: Negative for shortness of breath and wheezing.  Gastrointestinal: Negative for nausea and vomiting. Negative for abdominal pain, diarrhea and constipation.  Musculoskeletal: See HPI Neurological: Negative for headache and dizziness Skin: Negative for rash and pruritus   Psychiatric: Positive for depression. Negative for suicidal ideations  Past Medical History  Diagnosis Date  . Bipolar 1 disorder   . Depression   . GERD (gastroesophageal reflux disease)   . Anxiety   . Thyroid disease     hypothyroid  . Spinal stenosis   . Genital herpes   . Vitamin D deficiency     Past Surgical History  Procedure Laterality Date  . Abdominal hysterectomy    . Hammer toe surgery    . Nasal sinus surgery      History   Social History  . Marital Status: Single    Spouse Name: N/A    Number  of Children: N/A  . Years of Education: N/A   Occupational History  . Not on file.   Social History Main Topics  . Smoking status: Never Smoker   . Smokeless tobacco: Never Used  . Alcohol Use: No  . Drug Use: No  . Sexual Activity: Not Currently   Other Topics Concern  . Not on file   Social History Narrative      Medication List       This list is accurate as of: 07/22/14 11:59 PM.  Always use your most recent med list.               aspirin 81 MG chewable tablet  Chew 81 mg by mouth daily.     benzonatate 100 MG capsule  Commonly known as:  TESSALON  Take by mouth 3 (three) times daily as needed for cough.     cholecalciferol 1000 UNITS tablet  Commonly known as:  VITAMIN D  Take 2,000 Units by mouth daily.     divalproex 500 MG DR tablet  Commonly known as:  DEPAKOTE  Take 500 mg by mouth at bedtime.     docusate sodium 100 MG capsule  Commonly known as:  COLACE  Take 100 mg by mouth every 12 (twelve) hours.     FLUoxetine 20 MG capsule  Commonly known as:  PROZAC  Take 30 mg by mouth daily with breakfast.     guaiFENesin-dextromethorphan 100-10 MG/5ML syrup  Commonly known as:  ROBITUSSIN DM  Take 10 mLs by mouth every 6 (six) hours as needed for cough.     ibuprofen 400 MG tablet  Commonly known as:  ADVIL,MOTRIN  Take 400 mg by mouth every 8 (eight) hours as needed for mild pain or moderate pain.     lamoTRIgine 150 MG tablet  Commonly known as:  LAMICTAL  Take 150 mg by mouth daily.     levothyroxine 50 MCG tablet  Commonly known as:  SYNTHROID, LEVOTHROID  Take 50 mcg by mouth daily before breakfast.     meloxicam 15 MG tablet  Commonly known as:  MOBIC  Take 15 mg by mouth daily.     methocarbamol 500 MG tablet  Commonly known as:  ROBAXIN  Take 500 mg by mouth every 8 (eight) hours as needed for muscle spasms.     pantoprazole 40 MG tablet  Commonly known as:  PROTONIX  Take 40 mg by mouth daily.     phenylephrine-shark liver  oil-mineral oil-petrolatum 0.25-3-14-71.9 % rectal ointment  Commonly known as:  PREPARATION H  Place 1 application rectally 2 (two) times daily as needed for hemorrhoids.     polyethylene glycol packet  Commonly known as:  MIRALAX / GLYCOLAX  Take 17 g by mouth daily as needed for mild constipation.     traMADol 50 MG tablet  Commonly known as:  ULTRAM  Take one tablet by mouth three times daily as needed for pain        Physical Exam  BP 102/71 mmHg  Pulse 66  Temp(Src) 98.1 F (36.7 C)  Resp 20  Ht  (1.626 m)  Wt 194 lb 3.2 oz (88.089 kg)  BMI 33.32 kg/m2   Constitutional: WDWN elderly female in no acute distress. Conversant.  HEENT: Normocephalic and atraumatic. PERRL. EOM intact. No icterus. Neck: Supple and nontender. No lymphadenopathy, masses, or thyromegaly. No JVD or carotid bruits. Cardiac: Normal S1, S2. RRR without appreciable murmurs, rubs, or gallops. Distal pulses intact. No dependent edema.  Lungs: No respiratory distress. Breath sounds clear bilaterally without rales, rhonchi, or wheezes. Abdomen: Audible bowel sounds in all quadrants. Soft, nontender, nondistended.  Musculoskeletal: Able to move all extremities. Mid lower back tight and tender to palpation.  Pain with spine ROM.  Skin: Warm and dry.  Neurological: Alert and oriented to self Psychiatric: Flat affect  Labs Reviewed  CBC Latest Ref Rng 04/04/2014 03/14/2014 02/16/2014  WBC - 6.5 6.6 7.3  Hemoglobin 12.0 - 16.0 g/dL 11.6(A) 13.0 13.9  Hematocrit 36 - 46 % 36 38.7 41.9  Platelets 150 - 399 K/L 133(A) 169 166    CMP Latest Ref Rng 04/04/2014 03/14/2014 02/16/2014  Glucose 70 - 99 mg/dL - 94 409(W)  BUN 4 - 21 mg/dL 20 20 11(B)  Creatinine 0.5 - 1.1 mg/dL 0.6 1.47 8.29  Sodium 562 - 147 mmol/L 141 146 143  Potassium 3.4 - 5.3 mmol/L 3.9 4.8 4.3  Chloride 96 - 112 mEq/L - 106 104  CO2 19 - 32 mEq/L - 27 27  Calcium 8.4 - 10.5 mg/dL - 9.4 9.1  Total Protein 6.0 - 8.3 g/dL - 6.3 -    Total Bilirubin 0.3 - 1.2 mg/dL - 1.3(Y) -  Alkaline Phos 39 - 117 U/L - 64 -  AST 0 - 37 U/L - 13 -  ALT 0 - 35 U/L - 10 -   Assessment & Plan 1. Low back strain, initial encounter Continue ibuprofen  every  eight hours as needed. Will change tramadol 50mg  from daily to three times daily as needed for pain. Start robaxin 500mg  three times daily as needed for spasms. Reassess continue to monitor.   Family/Staff Communication Plan of care discussed with resident and nursing staff. Resident and nursing staff verbalized understanding and agree with plan of care. No additional questions or concerns reported.    Loura BackKim Markeise Mathews, MSN, AGNP-C [facility] [address] [place], [place] [address] [phone] [8am-5pm] After hours: [phone]

## 2014-08-05 ENCOUNTER — Non-Acute Institutional Stay (SKILLED_NURSING_FACILITY): Payer: Medicare Other | Admitting: Registered Nurse

## 2014-08-05 ENCOUNTER — Encounter: Payer: Self-pay | Admitting: Registered Nurse

## 2014-08-05 DIAGNOSIS — M4806 Spinal stenosis, lumbar region: Secondary | ICD-10-CM

## 2014-08-05 DIAGNOSIS — F329 Major depressive disorder, single episode, unspecified: Secondary | ICD-10-CM | POA: Diagnosis not present

## 2014-08-05 DIAGNOSIS — K219 Gastro-esophageal reflux disease without esophagitis: Secondary | ICD-10-CM

## 2014-08-05 DIAGNOSIS — F32A Depression, unspecified: Secondary | ICD-10-CM

## 2014-08-05 DIAGNOSIS — E039 Hypothyroidism, unspecified: Secondary | ICD-10-CM | POA: Diagnosis not present

## 2014-08-05 DIAGNOSIS — K5900 Constipation, unspecified: Secondary | ICD-10-CM | POA: Diagnosis not present

## 2014-08-05 DIAGNOSIS — F319 Bipolar disorder, unspecified: Secondary | ICD-10-CM | POA: Diagnosis not present

## 2014-08-05 DIAGNOSIS — E559 Vitamin D deficiency, unspecified: Secondary | ICD-10-CM

## 2014-08-05 DIAGNOSIS — M48061 Spinal stenosis, lumbar region without neurogenic claudication: Secondary | ICD-10-CM

## 2014-08-05 NOTE — Progress Notes (Signed)
Patient ID: Pam Jackson, female   DOB: 11-10-1940, 74 y.o.   MRN: [id number]   Place of Service: [facility]  Allergies  Allergen Reactions  . Valtrex [Valacyclovir Hcl] Other (See Comments)    Causes dizziness    Code Status: Full Code  Goals of Care: Longevity/LTC  Chief Complaint  Patient presents with  . Medical Management of Chronic Issues    lumbar stenosis, vit D def, bipolar disorder, depression, GERD, hypothyroidism, constipation    HPI 74 y.o. female with PMH of bipolar disorder, depression, hypothyroidism, GERD among others is being seen for a routine visit for management of her chronic issues. Weight stable. No recent fall or skin concerns reported. No change in behaviors or functional status reported. Hypothyroidism is stable on current dose of levothyroxine. GERD is stable on protonix. Vit D def stable on supplement. Still having back pain despite recent addition of robaxin. Constipation stable on colace and prn miralax. Mood stable on lamictal, prozac, and depakote. No other complaints verbalized by patient  Review of Systems Constitutional: Negative for fever, chills, and fatigue. HENT: Negative for ear pain, congestion, and sore throat Eyes: Negative for eye pain, eye discharge, and visual disturbance  Cardiovascular: Negative for chest pain, palpitations, and leg swelling Respiratory: Negative for cough, shortness of breath, and wheezing.  Gastrointestinal: Negative for nausea and vomiting. Negative for abdominal pain, diarrhea and constipation.  Genitourinary: Negative for  dysuria and hematuria Musculoskeletal: Positive for back pain Neurological: Negative for dizziness and weakness  Skin: Negative for rash.   Psychiatric: Positive for depression (no better or worse). Negative for suicidal ideations  Past Medical History  Diagnosis Date  . Bipolar 1 disorder   . Depression   . GERD (gastroesophageal reflux disease)   . Anxiety   . Thyroid  disease     hypothyroid  . Spinal stenosis   . Genital herpes   . Vitamin D deficiency     Past Surgical History  Procedure Laterality Date  . Abdominal hysterectomy    . Hammer toe surgery    . Nasal sinus surgery      History   Social History  . Marital Status: Single    Spouse Name: N/A    Number of Children: N/A  . Years of Education: N/A   Occupational History  . Not on file.   Social History Main Topics  . Smoking status: Never Smoker   . Smokeless tobacco: Never Used  . Alcohol Use: No  . Drug Use: No  . Sexual Activity: Not Currently   Other Topics Concern  . Not on file   Social History Narrative      Medication List       This list is accurate as of: 08/05/14 10:20 PM.  Always use your most recent med list.               aspirin 81 MG chewable tablet  Chew 81 mg by mouth daily.     benzonatate 100 MG capsule  Commonly known as:  TESSALON  Take by mouth 3 (three) times daily as needed for cough.     cholecalciferol 1000 UNITS tablet  Commonly known as:  VITAMIN D  Take 2,000 Units by mouth daily.     divalproex 500 MG DR tablet  Commonly known as:  DEPAKOTE  Take 500 mg by mouth at bedtime.     docusate sodium 100 MG capsule  Commonly known as:  COLACE  Take 100 mg  by mouth every 12 (twelve) hours.     FLUoxetine 20 MG capsule  Commonly known as:  PROZAC  Take 30 mg by mouth daily with breakfast.     guaiFENesin-dextromethorphan 100-10 MG/5ML syrup  Commonly known as:  ROBITUSSIN DM  Take 10 mLs by mouth every 6 (six) hours as needed for cough.     lamoTRIgine 150 MG tablet  Commonly known as:  LAMICTAL  Take 150 mg by mouth daily.     levothyroxine 50 MCG tablet  Commonly known as:  SYNTHROID, LEVOTHROID  Take 50 mcg by mouth daily before breakfast.     meloxicam 15 MG tablet  Commonly known as:  MOBIC  Take 15 mg by mouth daily.     methocarbamol 500 MG tablet  Commonly known as:  ROBAXIN  Take 500 mg by mouth every 8  (eight) hours as needed for muscle spasms.     oxyCODONE-acetaminophen 5-325 MG per tablet  Commonly known as:  PERCOCET/ROXICET  Take 1 tablet by mouth every 8 (eight) hours as needed for severe pain.     pantoprazole 40 MG tablet  Commonly known as:  PROTONIX  Take 40 mg by mouth daily.     phenylephrine-shark liver oil-mineral oil-petrolatum 0.25-3-14-71.9 % rectal ointment  Commonly known as:  PREPARATION H  Place 1 application rectally 2 (two) times daily as needed for hemorrhoids.     polyethylene glycol packet  Commonly known as:  MIRALAX / GLYCOLAX  Take 17 g by mouth daily as needed for mild constipation.        Physical Exam  BP 111/69 mmHg  Pulse 68  Temp(Src) 97.7 F (36.5 C)  Resp 18  Ht  (1.626 m)  Wt 194 lb 3.2 oz (88.089 kg)  BMI 33.32 kg/m2   Constitutional: WDWN elderly female in no acute distress. Conversant.  HEENT: Normocephalic and atraumatic. PERRL. EOM intact. No icterus. Oral mucosa moist. Posterior pharynx clear of any exudate or lesions.  Neck: Supple and nontender. No lymphadenopathy, masses, or thyromegaly. No JVD or carotid bruits. Cardiac: Normal S1, S2. RRR without appreciable murmurs, rubs, or gallops. Distal pulses intact. Trace dependent edema.  Lungs: No respiratory distress. Breath sounds clear bilaterally without rales, rhonchi, or wheezes. Abdomen: Audible bowel sounds in all quadrants. Soft, nontender, nondistended.  Musculoskeletal: Able to move all extremities. Mid lower back (lumbar region) tender to palpation Skin: Warm and dry.  Neurological: Alert and oriented to self Psychiatric: Flat affect  Labs Reviewed  CBC Latest Ref Rng 04/04/2014 03/14/2014 02/16/2014  WBC - 6.5 6.6 7.3  Hemoglobin 12.0 - 16.0 g/dL 11.6(A) 13.0 13.9  Hematocrit 36 - 46 % 36 38.7 41.9  Platelets 150 - 399 K/L 133(A) 169 166    CMP Latest Ref Rng 04/04/2014 03/14/2014 02/16/2014  Glucose 70 - 99 mg/dL - 94 119(J)  BUN 4 - 21 mg/dL 20 20 47(W)    Creatinine 0.5 - 1.1 mg/dL 0.6 2.95 6.21  Sodium 308 - 147 mmol/L 141 146 143  Potassium 3.4 - 5.3 mmol/L 3.9 4.8 4.3  Chloride 96 - 112 mEq/L - 106 104  CO2 19 - 32 mEq/L - 27 27  Calcium 8.4 - 10.5 mg/dL - 9.4 9.1  Total Protein 6.0 - 8.3 g/dL - 6.3 -  Total Bilirubin 0.3 - 1.2 mg/dL - 6.5(H) -  Alkaline Phos 39 - 117 U/L - 64 -  AST 0 - 37 U/L - 13 -  ALT 0 - 35 U/L - 10 -  Lab Results  Component Value Date   TSH 2.29 04/04/2014   Lab Results  Component Value Date   HGBA1C 5.4 04/04/2014   Lab Results  Component Value Date   VALPROATE 94.2 02/16/2014   04/04/14: depakote level 93.4  06/17/14 Vit D 25  Assessment & Plan 1. Hypothyroidism, unspecified hypothyroidism type Stable. last TSH 2.29. Continue synthroid 50mcg daily and monitor  2. Constipation, unspecified constipation type Stable. Continue colace 100mg  twice daily and miralax daily as needed. Encourage hydration and continue to monitor.   3. Gastroesophageal reflux disease, esophagitis presence not specified Stable. Continue protonix 40mg  daily and monitor.   4. Lumbar stenosis without neurogenic claudication Persists.  Continue mobic 15mg  daily with robaxin 500mg  every 8 hours as needed for muscle spasms. Will discontinue tramadol and ibuprofen and start percocet 5/325mg  every 8 hours as needed for pain. Reassess and continue to monitor.   5. Bipolar affective disorder, most recent episode unspecified type, remission status unspecified Stable. Followed by psy. Continue lamictal 150mg  daily and depakote 500mg  daily and monitor for change in mood and behaviors. Recheck depakote level.   6. Depression Stable. Followed by psy. Continue prozac 30mg  daily.  Continue to monitor for change in mood  7. Vit D deficiency Recent vit D level 45 on 07/21/13. Continue vit D 2000iu daily and monitor.   Labs ordered: Depakote level  Family/Staff Communication Plan of care discussed with resident and nursing staff.  Resident and nursing staff verbalized understanding and agree with plan of care. No additional questions or concerns reported.    Loura BackKim Ninnie Fein, MSN, AGNP-C [facility] [address] [place], [place] [address] [phone] [8am-5pm] After hours: [phone]

## 2014-09-03 ENCOUNTER — Other Ambulatory Visit: Payer: Self-pay

## 2014-09-03 MED ORDER — OXYCODONE-ACETAMINOPHEN 5-325 MG PO TABS: 1.0000 | ORAL_TABLET | Freq: Three times a day (TID) | ORAL | Status: DC | PRN

## 2014-09-03 NOTE — Telephone Encounter (Signed)
Rx faxed to [facility] @ [phone], phone number [phone]  

## 2014-09-29 ENCOUNTER — Encounter: Payer: Self-pay | Admitting: Internal Medicine

## 2014-09-29 ENCOUNTER — Non-Acute Institutional Stay (SKILLED_NURSING_FACILITY): Payer: Medicare Other | Admitting: Internal Medicine

## 2014-09-29 DIAGNOSIS — K219 Gastro-esophageal reflux disease without esophagitis: Secondary | ICD-10-CM

## 2014-09-29 DIAGNOSIS — E039 Hypothyroidism, unspecified: Secondary | ICD-10-CM

## 2014-09-29 DIAGNOSIS — F319 Bipolar disorder, unspecified: Secondary | ICD-10-CM

## 2014-09-29 DIAGNOSIS — E559 Vitamin D deficiency, unspecified: Secondary | ICD-10-CM | POA: Diagnosis not present

## 2014-09-29 NOTE — Progress Notes (Signed)
Patient ID: Pam RothmanConstance C Jackson, female   DOB: 10/27/1940, 74 y.o.   MRN: [id number]    Facility: [facility]  Allergies: valtrex  Chief complaint: medical management of chronic illness  Code status: full code  HPI 74 y/o female pt seen for RV. She is now under [facility]. She has PMH of bipolar disorder, depression, hypothyroidism, GERD among others. She has been at her baseline., followed by [facility]. Weight has been stable. She denies any concerns this visit.  Review of Systems  Constitutional: Negative for fever, chills, diaphoresis.  HENT: Negative for congestion Respiratory: Negative for cough, sputum production, shortness of breath and wheezing.   Cardiovascular: Negative for chest pain, palpitations, leg swelling.  Gastrointestinal: Negative for heartburn, nausea, vomiting, abdominal pain Musculoskeletal: Negative for falls Skin: Negative for itching and rash.   Psychiatric/Behavioral: mood stable  Past medical history reviewed  [facility]. [facility]  Physical exam BP 108/67 mmHg  Pulse 76  Temp(Src) 98 F (36.7 C)  Resp 18  SpO2 99%  General- elderly female in no acute distress Head- atraumatic, normocephalic Eyes- PERRLA, EOMI, no pallor, no icterus Neck- no lymphadenopathy Cardiovascular- normal s1,s2, no murmurs, trace edema of both legs Respiratory- bilateral clear to auscultation, no wheeze, no rhonchi, no crackles Abdomen- bowel sounds present, soft, non tender Musculoskeletal- able to move all 4 extremities Neurological- no focal deficit, oriented to self Skin- warm and dry Psychiatry- normal affect this visit  Labs reviewed  Assessement/plan  gerd Stable, continue protonix 40 mg daily for now  Hypothyroidism Continue synthroid 50mcg daily and monitor  Bipolar disorder Continue lamictal,depakote and prozac, followed by [facility]  Vitamin d deficiency Continue vitamin d supplement 2000 u  daily

## 2014-10-08 DIAGNOSIS — E559 Vitamin D deficiency, unspecified: Secondary | ICD-10-CM | POA: Insufficient documentation

## 2014-10-20 ENCOUNTER — Non-Acute Institutional Stay (SKILLED_NURSING_FACILITY): Payer: Medicare Other | Admitting: Internal Medicine

## 2014-10-20 DIAGNOSIS — E559 Vitamin D deficiency, unspecified: Secondary | ICD-10-CM

## 2014-10-20 DIAGNOSIS — M4806 Spinal stenosis, lumbar region: Secondary | ICD-10-CM | POA: Diagnosis not present

## 2014-10-20 DIAGNOSIS — K219 Gastro-esophageal reflux disease without esophagitis: Secondary | ICD-10-CM

## 2014-10-20 DIAGNOSIS — M48061 Spinal stenosis, lumbar region without neurogenic claudication: Secondary | ICD-10-CM

## 2014-10-20 NOTE — Progress Notes (Signed)
Patient ID: Pam RothmanConstance C Jackson, female   DOB: 11/04/1940, 74 y.o.   MRN: [id number]    Facility: [facility]  Allergies: valtrex  Chief complaint: medical management of chronic illness  Code status: full code  HPI 74 y/o female with PMH of bipolar disorder, depression, hypothyroidism, GERD among others is seen for routine visit. She has been at her baseline. She complaints of leg weakness and has been working with therapy team for strengthening exercises. Weight has been stable  Review of Systems  Constitutional: Negative for fever, chills, diaphoresis.  HENT: Negative for congestion Respiratory: Negative for cough, sputum production, shortness of breath and wheezing.   Cardiovascular: Negative for chest pain, palpitations, leg swelling.  Gastrointestinal: Negative for heartburn, nausea, vomiting, abdominal pain Musculoskeletal: Negative for falls Skin: Negative for itching and rash.  Psychiatric/Behavioral: mood stable  Past medical history reviewed  [facility]. [facility]  Physical exam BP 114/68 mmHg  Pulse 74  Temp(Src) 98 F (36.7 C)  Resp 19  SpO2 98%  General- elderly female in no acute distress Head- atraumatic, normocephalic Eyes- PERRLA, EOMI, no pallor, no icterus Neck- no lymphadenopathy Cardiovascular- normal s1,s2, no murmurs, trace edema of both legs Respiratory- bilateral clear to auscultation, no wheeze, no rhonchi, no crackles Abdomen- bowel sounds present, soft, non tender Musculoskeletal- able to move all 4 extremities, lower extremity weakness Neurological- no focal deficit, oriented to self Skin- warm and dry Psychiatry- normal affect this visit  Labs reviewed  Assessement/plan  Vitamin d def Continue vit d 2000 u daily  Spinal stenosis With lower leg weakness and pain, on meloxicam with prn oxycodone and prn robaxin. Continue to work with OT  gerd Stable, continue protonix 40 mg daily for  now

## 2014-11-07 ENCOUNTER — Non-Acute Institutional Stay (SKILLED_NURSING_FACILITY): Payer: Medicare Other | Admitting: Internal Medicine

## 2014-11-07 DIAGNOSIS — E039 Hypothyroidism, unspecified: Secondary | ICD-10-CM

## 2014-11-07 DIAGNOSIS — F319 Bipolar disorder, unspecified: Secondary | ICD-10-CM | POA: Diagnosis not present

## 2014-11-07 DIAGNOSIS — K219 Gastro-esophageal reflux disease without esophagitis: Secondary | ICD-10-CM | POA: Diagnosis not present

## 2014-11-07 NOTE — Progress Notes (Signed)
Patient ID: Pam RothmanConstance C Sienkiewicz, female   DOB: 04/22/1941, 74 y.o.   MRN: [id number]     Facility: [facility]  Allergies: valtrex  Chief complaint: medical management of chronic illness  Code status: full code  HPI 74 y/o female patient is seen for routine visit. She has been at her baseline. She has PMH of bipolar disorder, depression, hypothyroidism, GERD among others. Weight has been stable. No falls reported. She is now off meloxicam ad pain is under control.   Review of Systems  Constitutional: Negative for fever, chills, diaphoresis.  HENT: Negative for congestion Respiratory: Negative for cough, shortness of breath and wheezing.   Cardiovascular: Negative for chest pain, palpitations, leg swelling.  Gastrointestinal: Negative for heartburn, nausea, vomiting, abdominal pain Musculoskeletal: Negative for falls Skin: Negative for itching and rash.  Psychiatric/Behavioral: mood stable  Past medical history reviewed  [facility]ication [facility]. [facility]   Medication List       This list is accurate as of: 11/07/14  5:59 PM.  Always use your most recent med list.               aspirin 81 MG chewable tablet  Chew 81 mg by mouth daily.     benzonatate 100 MG capsule  Commonly known as:  TESSALON  Take by mouth 3 (three) times daily as needed for cough.     cholecalciferol 1000 UNITS tablet  Commonly known as:  VITAMIN D  Take 2,000 Units by mouth daily.     divalproex 500 MG DR tablet  Commonly known as:  DEPAKOTE  Take 500 mg by mouth at bedtime.     docusate sodium 100 MG capsule  Commonly known as:  COLACE  Take 100 mg by mouth every 12 (twelve) hours.     FLUoxetine 20 MG capsule  Commonly known as:  PROZAC  Take 30 mg by mouth daily with breakfast.     guaiFENesin-dextromethorphan 100-10 MG/5ML syrup  Commonly known as:  ROBITUSSIN DM  Take 10 mLs by mouth every 6 (six) hours as needed for cough.     lamoTRIgine 150 MG  tablet  Commonly known as:  LAMICTAL  Take 150 mg by mouth daily.     levothyroxine 50 MCG tablet  Commonly known as:  SYNTHROID, LEVOTHROID  Take 50 mcg by mouth daily before breakfast.     methocarbamol 500 MG tablet  Commonly known as:  ROBAXIN  Take 500 mg by mouth every 8 (eight) hours as needed for muscle spasms.     oxyCODONE-acetaminophen 5-325 MG per tablet  Commonly known as:  PERCOCET/ROXICET  Take 1 tablet by mouth every 8 (eight) hours as needed for severe pain. DO NOT EXCEED 4GM OF TYLENOL IN 24 HOURS     pantoprazole 40 MG tablet  Commonly known as:  PROTONIX  Take 40 mg by mouth daily.     phenylephrine-shark liver oil-mineral oil-petrolatum 0.25-3-14-71.9 % rectal ointment  Commonly known as:  PREPARATION H  Place 1 application rectally 2 (two) times daily as needed for hemorrhoids.     polyethylene glycol packet  Commonly known as:  MIRALAX / GLYCOLAX  Take 17 g by mouth daily as needed for mild constipation.        Physical exam BP 127/63 mmHg  Pulse 77  Temp(Src) 98.5 F (36.9 C)  Resp 18  Ht 5\' 4"  (1.626 m)  Wt 204 lb 4.8 oz (92.67 kg)  BMI 35.05 kg/m2  SpO2 96%  General- elderly female obese in no acute distress Head- atraumatic, normocephalic Eyes- PERRLA, EOMI, no pallor, no icterus Neck- no lymphadenopathy Cardiovascular- normal s1,s2, no murmurs, trace edema of both legs Respiratory- bilateral clear to auscultation, no wheeze, no rhonchi, no crackles Abdomen- bowel sounds present, soft, non tender Musculoskeletal- able to move all 4 extremities, lower extremity weakness Neurological- no focal deficit, oriented to self Skin- warm and dry Psychiatry- normal affect this visit  Labs reviewed 09/09/14 wbc 7.8, hb 13, plt 144, na 141, k 4.2, bun 18, cr 0.71, glu 94, alb 3.5, ca 8.9  Lab Results  Component Value Date   TSH 2.29 04/04/2014   Lipid Panel     Component Value Date/Time   CHOL 188 06/05/2014   TRIG 147 06/05/2014   HDL 47  06/05/2014   LDLCALC 112 06/05/2014    Assessement/plan  Hypothyroidism Check tsh. Reviewed tsh from 10/15. Continue levothyroxine 50 mcg daily for now  Bipolar disorder Stable, continue lamictal 150 mg daily with fluoxetine 30 mg daily and depakote 1000 mg qhs  gerd Stable, decrease protonix to 20 mg daily for now and monitor her symptoms  Oneal GroutMAHIMA Kerensa Nicklas, MD  [facility] [phone] (Monday-Friday 8 am - 5 pm) [phone] (afterhours)

## 2014-12-08 ENCOUNTER — Encounter: Payer: Self-pay | Admitting: Internal Medicine

## 2014-12-08 ENCOUNTER — Non-Acute Institutional Stay (SKILLED_NURSING_FACILITY): Payer: Medicare Other | Admitting: Internal Medicine

## 2014-12-08 DIAGNOSIS — M4806 Spinal stenosis, lumbar region: Secondary | ICD-10-CM

## 2014-12-08 DIAGNOSIS — K5900 Constipation, unspecified: Secondary | ICD-10-CM

## 2014-12-08 DIAGNOSIS — M48061 Spinal stenosis, lumbar region without neurogenic claudication: Secondary | ICD-10-CM

## 2014-12-08 DIAGNOSIS — E039 Hypothyroidism, unspecified: Secondary | ICD-10-CM | POA: Diagnosis not present

## 2014-12-08 NOTE — Progress Notes (Signed)
Patient ID: Pam Jackson, female   DOB: 03/17/1941, 74 y.o.   MRN: [id number]      [facility]    Chief Complaint  Patient presents with  . Medical Management of Chronic Issues    Routine Visit    Allergies: valtrex  Code status: full code  HPI 74 y/o female patient is seen for routine visit. She has been at her baseline. She denies any concerns. No new concerns from staff. She has PMH of bipolar disorder, depression, hypothyroidism, GERD among others. Weight has been stable. No falls reported.   Review of Systems  Constitutional: Negative for fever, chills, diaphoresis.  HENT: Negative for congestion Respiratory: Negative for cough, shortness of breath and wheezing.   Cardiovascular: Negative for chest pain, palpitations, leg swelling.  Gastrointestinal: Negative for heartburn, nausea, vomiting, abdominal pain Musculoskeletal: Negative for falls Skin: Negative for itching and rash.  Psychiatric/Behavioral: mood stable  Past medical history reviewed  Medication [facility]. [facility]   Medication List       This list is accurate as of: 12/08/14 11:59 PM.  Always use your most recent med list.               aspirin 81 MG chewable tablet  Chew 81 mg by mouth daily.     benzonatate 100 MG capsule  Commonly known as:  TESSALON  Take by mouth 3 (three) times daily as needed for cough.     cholecalciferol 1000 UNITS tablet  Commonly known as:  VITAMIN D  Take 2,000 Units by mouth daily.     divalproex 500 MG DR tablet  Commonly known as:  DEPAKOTE  Take 500 mg by mouth at bedtime.     docusate sodium 100 MG capsule  Commonly known as:  COLACE  Take 100 mg by mouth every 12 (twelve) hours.     FLUoxetine 20 MG capsule  Commonly known as:  PROZAC  Take 30 mg by mouth daily with breakfast.     guaiFENesin-dextromethorphan 100-10 MG/5ML syrup  Commonly known as:  ROBITUSSIN DM  Take 10 mLs by mouth every 6 (six) hours as needed for  cough.     lamoTRIgine 150 MG tablet  Commonly known as:  LAMICTAL  Take 150 mg by mouth daily.     levothyroxine 50 MCG tablet  Commonly known as:  SYNTHROID, LEVOTHROID  Take 50 mcg by mouth daily before breakfast.     methocarbamol 500 MG tablet  Commonly known as:  ROBAXIN  Take 500 mg by mouth every 8 (eight) hours as needed for muscle spasms.     oxyCODONE-acetaminophen 5-325 MG per tablet  Commonly known as:  PERCOCET/ROXICET  Take 1 tablet by mouth every 8 (eight) hours as needed for severe pain. DO NOT EXCEED 4GM OF TYLENOL IN 24 HOURS     pantoprazole 40 MG tablet  Commonly known as:  PROTONIX  Take 40 mg by mouth daily.     phenylephrine-shark liver oil-mineral oil-petrolatum 0.25-3-14-71.9 % rectal ointment  Commonly known as:  PREPARATION H  Place 1 application rectally 2 (two) times daily as needed for hemorrhoids.     polyethylene glycol packet  Commonly known as:  MIRALAX / GLYCOLAX  Take 17 g by mouth daily as needed for mild constipation.        Physical exam BP 96/67 mmHg  Pulse 71  Temp(Src) 97.3 F (36.3 C) (Oral)  Resp 20  SpO2 93%  General- elderly female  obese in no acute distress Head- atraumatic, normocephalic Eyes- PERRLA, EOMI, no pallor, no icterus Neck- no lymphadenopathy Cardiovascular- normal s1,s2, no murmurs, trace edema of both legs Respiratory- bilateral clear to auscultation, no wheeze, no rhonchi, no crackles Abdomen- bowel sounds present, soft, non tender Musculoskeletal- able to move all 4 extremities, lower extremity weakness Neurological- no focal deficit, oriented to self Skin- warm and dry Psychiatry- normal affect this visit  Labs reviewed 09/09/14 wbc 7.8, hb 13, plt 144, na 141, k 4.2, bun 18, cr 0.71, glu 94, alb 3.5, ca 8.9   Assessement/plan  Constipation Stable, continue colace 100 mg bid with miralax 17 g daily prn  Lumbar spinal stenosis Continue percocet 5/325 q8h prn pain with robaxin as needed for  muscle spasm and monitor  Hypothyroidism Continue levothyroxine 50 mcg daily for now   Oneal Grout, MD  [facility] [phone] (Monday-Friday 8 am - 5 pm) [phone] (afterhours)

## 2015-01-20 ENCOUNTER — Non-Acute Institutional Stay (SKILLED_NURSING_FACILITY): Payer: Medicare Other | Admitting: Internal Medicine

## 2015-01-20 DIAGNOSIS — E039 Hypothyroidism, unspecified: Secondary | ICD-10-CM | POA: Diagnosis not present

## 2015-01-20 DIAGNOSIS — F3160 Bipolar disorder, current episode mixed, unspecified: Secondary | ICD-10-CM

## 2015-01-20 DIAGNOSIS — F0391 Unspecified dementia with behavioral disturbance: Secondary | ICD-10-CM | POA: Diagnosis not present

## 2015-01-20 DIAGNOSIS — F03918 Unspecified dementia, unspecified severity, with other behavioral disturbance: Secondary | ICD-10-CM | POA: Insufficient documentation

## 2015-01-20 DIAGNOSIS — N760 Acute vaginitis: Secondary | ICD-10-CM

## 2015-01-20 NOTE — Progress Notes (Signed)
Patient ID: Pam RothmanConstance C Jackson, female   DOB: 09/06/1940, 74 y.o.   MRN: [id number]     [facility]    Chief Complaint  Patient presents with  . Medical Management of Chronic Issues   Allergies: valtrex  Code status: full code  HPI 74 y/o female patient is seen for routine visit. She complaints of soreness on her bottom. Staff have noticed redness in her vaginal area with some drainage for few days. She complaints of discomfort and itching in the area. No other concerns. She has PMH of dementia, bipolar disorder, depression, hypothyroidism, GERD among others. Weight has been stable. No falls reported. Last MMSE 22/30  Review of Systems  Constitutional: Negative for fever, chills, diaphoresis.  HENT: Negative for congestion Respiratory: Negative for cough, shortness of breath and wheezing.   Cardiovascular: Negative for chest pain, palpitations, leg swelling.  Gastrointestinal: Negative for heartburn, nausea, vomiting, abdominal pain Musculoskeletal: Negative for falls Psychiatric/Behavioral: mood stable  Past medical history reviewed  Medication reviewed. [facility]   Medication List       This list is accurate as of: 01/20/15  5:05 PM.  Always use your most recent med list.               acetaminophen 650 MG CR tablet  Commonly known as:  TYLENOL  Take 650 mg by mouth 2 (two) times daily.     aspirin 81 MG chewable tablet  Chew 81 mg by mouth daily.     benzonatate 100 MG capsule  Commonly known as:  TESSALON  Take by mouth 3 (three) times daily as needed for cough.     cholecalciferol 1000 UNITS tablet  Commonly known as:  VITAMIN D  Take 2,000 Units by mouth daily.     divalproex 500 MG DR tablet  Commonly known as:  DEPAKOTE  Take 1,000 mg by mouth at bedtime.     FLUoxetine 20 MG capsule  Commonly known as:  PROZAC  Take 30 mg by mouth daily with breakfast.     guaiFENesin-dextromethorphan 100-10 MG/5ML syrup  Commonly  known as:  ROBITUSSIN DM  Take 10 mLs by mouth every 6 (six) hours as needed for cough.     lamoTRIgine 150 MG tablet  Commonly known as:  LAMICTAL  Take 150 mg by mouth daily.     levothyroxine 50 MCG tablet  Commonly known as:  SYNTHROID, LEVOTHROID  Take 50 mcg by mouth daily before breakfast.     methocarbamol 500 MG tablet  Commonly known as:  ROBAXIN  Take 500 mg by mouth every 8 (eight) hours as needed for muscle spasms.     oxyCODONE-acetaminophen 5-325 MG per tablet  Commonly known as:  PERCOCET/ROXICET  Take 1 tablet by mouth every 8 (eight) hours as needed for severe pain. DO NOT EXCEED 4GM OF TYLENOL IN 24 HOURS     pantoprazole 40 MG tablet  Commonly known as:  PROTONIX  Take 20 mg by mouth daily.        Physical exam BP 119/62 mmHg  Pulse 72  Temp(Src) 97.3 F (36.3 C)  Resp 18  Wt 202 lb 14.4 oz (92.035 kg)  SpO2 96%  General- elderly female obese in no acute distress Head- atraumatic, normocephalic Eyes- PERRLA, EOMI, no pallor, no icterus Neck- no lymphadenopathy Cardiovascular- normal s1,s2, no murmurs, trace edema of both legs Respiratory- bilateral clear to auscultation, no wheeze, no rhonchi, no crackles Abdomen- bowel sounds present, soft,  non tender GU- has erythema in labia area with purulent drainage Musculoskeletal- able to move all 4 extremities, lower extremity weakness Neurological- no focal deficit, oriented to self Skin- warm and dry, erythema in her buttock area but no skin breakdown Psychiatry- normal affect this visit  Labs reviewed 09/09/14 wbc 7.8, hb 13, plt 144, na 141, k 4.2, bun 18, cr 0.71, glu 94, alb 3.5, ca 8.9 11/10/14 tsh 3.332   Assessement/plan  Labial infection Has purulent drainage with discomfort and itching. Start fluconazole 100 mg daily x 5 days with doxycycline 100 mg bid for 7 days. Continue perineal hygiene. Monitor for fever. Check cbc with diff. If no improvement, gyn referral  Dementia with behavioral  disturbance Chronic, ongoing, mmse 22/30. Continue depakote, lamictal and prozac with psych services. Assistance with ADLs, pressure ulcer prophylaxis  Bipolar disorder Continue lamictal 150 mg daily, depakote 1000 mg daily and prozac 30 mg daily and monitor mood.  Hypothyroidism Continue levothyroxine 50 mcg daily for now, reviewed tsh, stable   Oneal Grout, MD  [facility] [phone] (Monday-Friday 8 am - 5 pm) [phone] (afterhours)

## 2015-02-24 ENCOUNTER — Non-Acute Institutional Stay (SKILLED_NURSING_FACILITY): Payer: Medicare Other | Admitting: Internal Medicine

## 2015-02-24 DIAGNOSIS — M4806 Spinal stenosis, lumbar region: Secondary | ICD-10-CM

## 2015-02-24 DIAGNOSIS — F03918 Unspecified dementia, unspecified severity, with other behavioral disturbance: Secondary | ICD-10-CM

## 2015-02-24 DIAGNOSIS — K219 Gastro-esophageal reflux disease without esophagitis: Secondary | ICD-10-CM | POA: Diagnosis not present

## 2015-02-24 DIAGNOSIS — F0391 Unspecified dementia with behavioral disturbance: Secondary | ICD-10-CM

## 2015-02-24 DIAGNOSIS — M48062 Spinal stenosis, lumbar region with neurogenic claudication: Secondary | ICD-10-CM

## 2015-02-24 NOTE — Progress Notes (Signed)
Patient ID: Pam Jackson, female   DOB: 03/10/1941, 74 y.o.   MRN: [id number]       [facility]    Chief Complaint  Patient presents with  . Medical Management of Chronic Issues   Allergies: valtrex  Code status: full code  HPI 74 y/o female patient is seen for routine visit. She has PMH of dementia, bipolar disorder, depression, hypothyroidism, GERD among others. She complaints of pain in her leg and feet. She has hx of lumbar spinal stenosis. Reflux symptom stable  Review of Systems  Constitutional: Negative for fever, chills, diaphoresis.  HENT: Negative for congestion Respiratory: Negative for cough, shortness of breath and wheezing.   Cardiovascular: Negative for chest pain, palpitations, leg swelling.  Gastrointestinal: Negative for heartburn, nausea, vomiting, abdominal pain Musculoskeletal: Negative for falls Psychiatric/Behavioral: mood stable  Past medical history reviewed  Medication reviewed. See [facility]   Medication List       This list is accurate as of: 02/24/15  4:36 PM.  Always use your most recent med list.               acetaminophen 650 MG CR tablet  Commonly known as:  TYLENOL  Take 650 mg by mouth 2 (two) times daily.     aspirin 81 MG chewable tablet  Chew 81 mg by mouth daily.     benzonatate 100 MG capsule  Commonly known as:  TESSALON  Take by mouth 3 (three) times daily as needed for cough.     cholecalciferol 1000 UNITS tablet  Commonly known as:  VITAMIN D  Take 2,000 Units by mouth daily.     divalproex 500 MG DR tablet  Commonly known as:  DEPAKOTE  Take 1,000 mg by mouth at bedtime.     FLUoxetine 20 MG capsule  Commonly known as:  PROZAC  Take 30 mg by mouth daily with breakfast.     guaiFENesin-dextromethorphan 100-10 MG/5ML syrup  Commonly known as:  ROBITUSSIN DM  Take 10 mLs by mouth every 6 (six) hours as needed for cough.     lamoTRIgine 150 MG tablet  Commonly known as:   LAMICTAL  Take 150 mg by mouth daily.     levothyroxine 50 MCG tablet  Commonly known as:  SYNTHROID, LEVOTHROID  Take 50 mcg by mouth daily before breakfast.     methocarbamol 500 MG tablet  Commonly known as:  ROBAXIN  Take 500 mg by mouth every 8 (eight) hours as needed for muscle spasms.     oxyCODONE-acetaminophen 5-325 MG per tablet  Commonly known as:  PERCOCET/ROXICET  Take 1 tablet by mouth every 8 (eight) hours as needed for severe pain. DO NOT EXCEED 4GM OF TYLENOL IN 24 HOURS     pantoprazole 40 MG tablet  Commonly known as:  PROTONIX  Take 20 mg by mouth daily.        Physical exam BP 150/70 mmHg  Pulse 68  Temp(Src) 97.7 F (36.5 C)  Resp 16  Wt 202 lb 14.4 oz (92.035 kg)  Wt Readings from Last 3 Encounters:  02/24/15 202 lb 14.4 oz (92.035 kg)  01/20/15 202 lb 14.4 oz (92.035 kg)  11/07/14 204 lb 4.8 oz (92.67 kg)   General- elderly female obese in no acute distress Head- atraumatic, normocephalic Eyes- PERRLA, EOMI, no pallor, no icterus Neck- no lymphadenopathy Cardiovascular- normal s1,s2, no murmurs, trace edema of both legs Respiratory- bilateral clear to auscultation, no wheeze, no rhonchi,  no crackles Abdomen- bowel sounds present, soft, non tender GU- has erythema in labia area with purulent drainage Musculoskeletal- able to move all 4 extremities, lower extremity weakness, no tenderness on palpation, normal temperature Neurological- no focal deficit, oriented to self Skin- warm and dry Psychiatry- normal affect this visit  Labs reviewed 09/09/14 wbc 7.8, hb 13, plt 144, na 141, k 4.2, bun 18, cr 0.71, glu 94, alb 3.5, ca 8.9 11/10/14 tsh 3.332   Assessement/plan  Lumbar spinal stenosis with neurogenic claudication Currently on percocet 5-325 1 tab q8h prn with robaxin 500 mg q8h prn. With her memory issues, she forgets to ask for medication. Will have her robaxin changed to 500 mg bid with additional one dose in noon time if needed. Will  also schedule her percocet to 5-325 mg bid and additional one dose in noon if needed. Consider adding neurontin 100 mg po qhs if no improvement.  gerd Stable, on protonix 20 mg daily. Continue this and consider starting H2 blocker and d/c of PPI next visit.   Dementia with behavioral disturbance Chronic, ongoing, continue depakote, lamictal and prozac with psych services. Assistance with ADLs, pressure ulcer prophylaxis   Oneal Grout, MD  [facility] [phone] (Monday-Friday 8 am - 5 pm) [phone] (afterhours)

## 2015-02-25 ENCOUNTER — Other Ambulatory Visit: Payer: Self-pay

## 2015-02-25 MED ORDER — OXYCODONE-ACETAMINOPHEN 5-325 MG PO TABS: ORAL_TABLET | ORAL | Status: DC

## 2015-02-25 NOTE — Telephone Encounter (Signed)
Rx faxed to [facility] @ [phone], phone number [phone]  

## 2015-03-24 ENCOUNTER — Non-Acute Institutional Stay (SKILLED_NURSING_FACILITY): Payer: Medicare Other | Admitting: Internal Medicine

## 2015-03-24 DIAGNOSIS — F3130 Bipolar disorder, current episode depressed, mild or moderate severity, unspecified: Secondary | ICD-10-CM

## 2015-03-24 DIAGNOSIS — K219 Gastro-esophageal reflux disease without esophagitis: Secondary | ICD-10-CM

## 2015-03-24 DIAGNOSIS — E039 Hypothyroidism, unspecified: Secondary | ICD-10-CM

## 2015-03-24 DIAGNOSIS — F319 Bipolar disorder, unspecified: Secondary | ICD-10-CM

## 2015-03-24 NOTE — Progress Notes (Signed)
Patient ID: Pam Jackson, female   DOB: Mar 22, 1941, 74 y.o.   MRN: [id number]      [facility] Re[facility] - [facility]    Chief Complaint  Patient presents with  . Medical Management of Chronic Issues   Allergies: valtrex  Code status: full code  HPI 74 y/o female patient is seen for routine visit. She complaints of feeling low for past couple of days and wanting to get out of this facility. Her pain is under control with current regimen. She lacks motivation and gets tearful this visit. Denies any suicidal ideation. She has PMH of dementia, bipolar disorder, depression, hypothyroidism, GERD among others.   Review of Systems  Constitutional: Negative for fever, chills, diaphoresis.  HENT: Negative for congestion, sore throat Respiratory: Negative for cough, shortness of breath and wheezing.   Cardiovascular: Negative for chest pain, palpitations, leg swelling.  Gastrointestinal: Negative for heartburn, nausea, vomiting, abdominal pain Musculoskeletal: Negative for falls   Past medical history reviewed  Medication reviewed. See [facility]   Medication List       This list is accurate as of: 03/24/15  3:22 PM.  Always use your most recent med list.               acetaminophen 650 MG CR tablet  Commonly known as:  TYLENOL  Take 650 mg by mouth 2 (two) times daily.     aspirin 81 MG chewable tablet  Chew 81 mg by mouth daily.     benzonatate 100 MG capsule  Commonly known as:  TESSALON  Take by mouth 3 (three) times daily as needed for cough.     cholecalciferol 1000 UNITS tablet  Commonly known as:  VITAMIN D  Take 2,000 Units by mouth daily.     divalproex 500 MG DR tablet  Commonly known as:  DEPAKOTE  Take 1,000 mg by mouth at bedtime.     FLUoxetine 20 MG capsule  Commonly known as:  PROZAC  Take 30 mg by mouth daily with breakfast.     guaiFENesin-dextromethorphan 100-10 MG/5ML syrup  Commonly known as:  ROBITUSSIN DM  Take 10 mLs by mouth every  6 (six) hours as needed for cough.     lamoTRIgine 150 MG tablet  Commonly known as:  LAMICTAL  Take 150 mg by mouth daily.     levothyroxine 50 MCG tablet  Commonly known as:  SYNTHROID, LEVOTHROID  Take 50 mcg by mouth daily before breakfast.     methocarbamol 500 MG tablet  Commonly known as:  ROBAXIN  Take 500 mg by mouth. Bid at 9 am and 9 pm with additional one tablet at 2 pm if needed     oxyCODONE-acetaminophen 5-325 MG per tablet  Commonly known as:  PERCOCET/ROXICET  1 tablet by mouth every 12 hours at 9 am and 9 pm DO NOT EXCEED 4GM OF TYLENOL IN 24 HOURS     pantoprazole 40 MG tablet  Commonly known as:  PROTONIX  Take 20 mg by mouth daily.        Physical exam BP 118/60 mmHg  Pulse 78  Temp(Src) 97.4 F (36.3 C)  Resp 18  SpO2 98%  Wt Readings from Last 3 Encounters:  02/24/15 202 lb 14.4 oz (92.035 kg)  01/20/15 202 lb 14.4 oz (92.035 kg)  11/07/14 204 lb 4.8 oz (92.67 kg)   General- elderly female obese in no acute distress Head- atraumatic, normocephalic Eyes- PERRLA, EOMI, no pallor, no icterus Neck- no  lymphadenopathy Cardiovascular- normal s1,s2, no murmurs, trace edema of both legs Respiratory- bilateral clear to auscultation, no wheeze, no rhonchi, no crackles Abdomen- bowel sounds present, soft, non tender GU- has erythema in labia area with purulent drainage Musculoskeletal- able to move all 4 extremities, lower extremity weakness more compared to upper extremity Neurological- no focal deficit, oriented to self Skin- warm and dry Psychiatry- flat affect this visit  Labs reviewed 09/09/14 wbc 7.8, hb 13, plt 144, na 141, k 4.2, bun 18, cr 0.71, glu 94, alb 3.5, ca 8.9 11/10/14 tsh 3.332   Assessement/plan  Depression with bipolar disorder Increase her current regimen of prozac to 40 mg daily. Continue lamictal 150 mg daily and depakote 1000 mg daily and will get psych services to assess further  Hypothyroidism With worsening mood and  lack of energy, recheck tsh and free t4, continue levothyroxine 50 mcg daily for now  gerd Stable. Change protonix to 20 mg daily as needed only and on next visit, if has minimal use, consider discontinuing it   Oneal Grout, MD  [facility] [phone] (Monday-Friday 8 am - 5 pm) [phone] (afterhours)

## 2015-04-15 ENCOUNTER — Other Ambulatory Visit: Payer: Self-pay

## 2015-04-15 MED ORDER — OXYCODONE-ACETAMINOPHEN 5-325 MG PO TABS: ORAL_TABLET | ORAL | Status: DC

## 2015-04-15 NOTE — Telephone Encounter (Signed)
Rx faxed to [facility] @ [phone], phone number [phone]  

## 2015-05-13 ENCOUNTER — Non-Acute Institutional Stay (SKILLED_NURSING_FACILITY): Payer: Medicare Other | Admitting: Internal Medicine

## 2015-05-13 DIAGNOSIS — A498 Other bacterial infections of unspecified site: Secondary | ICD-10-CM

## 2015-05-13 DIAGNOSIS — K219 Gastro-esophageal reflux disease without esophagitis: Secondary | ICD-10-CM

## 2015-05-13 DIAGNOSIS — D519 Vitamin B12 deficiency anemia, unspecified: Secondary | ICD-10-CM | POA: Diagnosis not present

## 2015-05-13 DIAGNOSIS — B964 Proteus (mirabilis) (morganii) as the cause of diseases classified elsewhere: Secondary | ICD-10-CM | POA: Diagnosis not present

## 2015-05-13 DIAGNOSIS — E039 Hypothyroidism, unspecified: Secondary | ICD-10-CM | POA: Diagnosis not present

## 2015-05-13 NOTE — Progress Notes (Signed)
Patient ID: Pam Jackson, female   DOB: 1941-02-05, 74 y.o.   MRN: [id number]      [facility] - [facility]    Chief Complaint  Patient presents with  . Medical Management of Chronic Issues   Allergies: valtrex  Code status: full code  HPI 74 y/o female patient is seen for routine visit. She has PMH of dementia, bipolar disorder, depression, hypothyroidism, GERD among others. She has been at her baseline. No new concerns from the staff. She was treated for UTI recently. Currently symptom free.   Review of Systems  Constitutional: Negative for fever, chills, diaphoresis.  HENT: Negative for congestion, sore throat Respiratory: Negative for cough, shortness of breath and wheezing.   Cardiovascular: Negative for chest pain, palpitations, leg swelling.  Gastrointestinal: Negative for heartburn, nausea, vomiting, abdominal pain Musculoskeletal: Negative for falls   Past medical history reviewed  Medication reviewed. See [facility]   Medication List       This list is accurate as of: 05/13/15  4:48 PM.  Always use your most recent med list.               acetaminophen 650 MG CR tablet  Commonly known as:  TYLENOL  Take 650 mg by mouth 2 (two) times daily.     aspirin 81 MG chewable tablet  Chew 81 mg by mouth daily.     benzonatate 100 MG capsule  Commonly known as:  TESSALON  Take by mouth 3 (three) times daily as needed for cough.     cholecalciferol 1000 UNITS tablet  Commonly known as:  VITAMIN D  Take 2,000 Units by mouth daily.     divalproex 500 MG DR tablet  Commonly known as:  DEPAKOTE  Take 1,000 mg by mouth at bedtime.     FLUoxetine 20 MG capsule  Commonly known as:  PROZAC  Take 30 mg by mouth daily with breakfast.     guaiFENesin-dextromethorphan 100-10 MG/5ML syrup  Commonly known as:  ROBITUSSIN DM  Take 10 mLs by mouth every 6 (six) hours as needed for cough.     lamoTRIgine 150 MG tablet  Commonly known as:  LAMICTAL    Take 150 mg by mouth daily.     levothyroxine 50 MCG tablet  Commonly known as:  SYNTHROID, LEVOTHROID  Take 50 mcg by mouth daily before breakfast.     methocarbamol 500 MG tablet  Commonly known as:  ROBAXIN  Take 500 mg by mouth. Bid at 9 am and 9 pm with additional one tablet at 2 pm if needed     oxyCODONE-acetaminophen 5-325 MG tablet  Commonly known as:  PERCOCET/ROXICET  1 tablet by mouth every 12 hours at 9 am and 9 pm DO NOT EXCEED 4GM OF TYLENOL IN 24 HOURS     pantoprazole 40 MG tablet  Commonly known as:  PROTONIX  Take 20 mg by mouth daily.     vitamin B-12 1000 MCG tablet  Commonly known as:  CYANOCOBALAMIN  Take 1,000 mcg by mouth daily.        Physical exam BP 122/64 mmHg  Pulse 70  Temp(Src) 97.9 F (36.6 C)  Resp 18  Wt 200 lb 1.6 oz (90.765 kg)  SpO2 94%  Wt Readings from Last 3 Encounters:  05/13/15 200 lb 1.6 oz (90.765 kg)  02/24/15 202 lb 14.4 oz (92.035 kg)  01/20/15 202 lb 14.4 oz (92.035 kg)   General- elderly female obese in no acute  distress Head- atraumatic, normocephalic Eyes- PERRLA, EOMI, no pallor, no icterus Neck- no lymphadenopathy Cardiovascular- normal s1,s2, no murmurs, trace edema of both legs Respiratory- bilateral clear to auscultation, no wheeze, no rhonchi, no crackles Abdomen- bowel sounds present, soft, non tender GU- has erythema in labia area with purulent drainage Musculoskeletal- able to move all 4 extremities, lower extremity weakness more compared to upper extremity Neurological- no focal deficit, oriented to self Skin- warm and dry Psychiatry- flat affect this visit  Labs reviewed 09/09/14 wbc 7.8, hb 13, plt 144, na 141, k 4.2, bun 18, cr 0.71, glu 94, alb 3.5, ca 8.9 11/10/14 tsh 3.332 03/31/15 wbc 9.1, hb 12.5, hct 39.5, mcv 101.5, na 144, k 3.9, bun 18, cr 0.66, lft wnl, vitamin b12 357, vit d 32.79, valproic acid 76.2, tsh 2.45   Assessement/plan  b12 deficiency Continue b12 supplement 1000 mcg daily,  started recently, monitor b12 level  Proteus uti Completed course of ciprofloxacin, asymptomatic at present, hydration encouraged, monitor  Hypothyroidism Stable TSH, continue levothyroxine 50 mcg daily for now  gerd Stable. Discontinue protonix. monitor   Oneal GroutMAHIMA Lamiracle Chaidez, MD  [facility] [phone] (Monday-Friday 8 am - 5 pm) [phone] (afterhours)

## 2015-05-14 ENCOUNTER — Other Ambulatory Visit: Payer: Self-pay | Admitting: *Deleted

## 2015-05-14 MED ORDER — OXYCODONE-ACETAMINOPHEN 5-325 MG PO TABS: ORAL_TABLET | ORAL | Status: DC

## 2015-05-14 NOTE — Telephone Encounter (Signed)
[facility] 

## 2015-06-10 LAB — CBC AND DIFFERENTIAL
HCT: 41 % (ref 36–46)
Hemoglobin: 13.5 g/dL (ref 12.0–16.0)
PLATELETS: 190 10*3/uL (ref 150–399)
WBC: 9.9 10^3/mL

## 2015-06-22 ENCOUNTER — Other Ambulatory Visit: Payer: Self-pay | Admitting: *Deleted

## 2015-06-22 MED ORDER — OXYCODONE-ACETAMINOPHEN 5-325 MG PO TABS: ORAL_TABLET | ORAL | Status: DC

## 2015-06-22 NOTE — Telephone Encounter (Signed)
[facility] 

## 2015-07-07 IMAGING — CR DG KNEE COMPLETE 4+V*L*
4 series · 4 of 4 positions shown · non-contrast
Comparison: None

CLINICAL DATA: LEFT hip and knee pain, fell this morning

EXAM:
LEFT KNEE - COMPLETE 4+ VIEW

[t knee ap left]
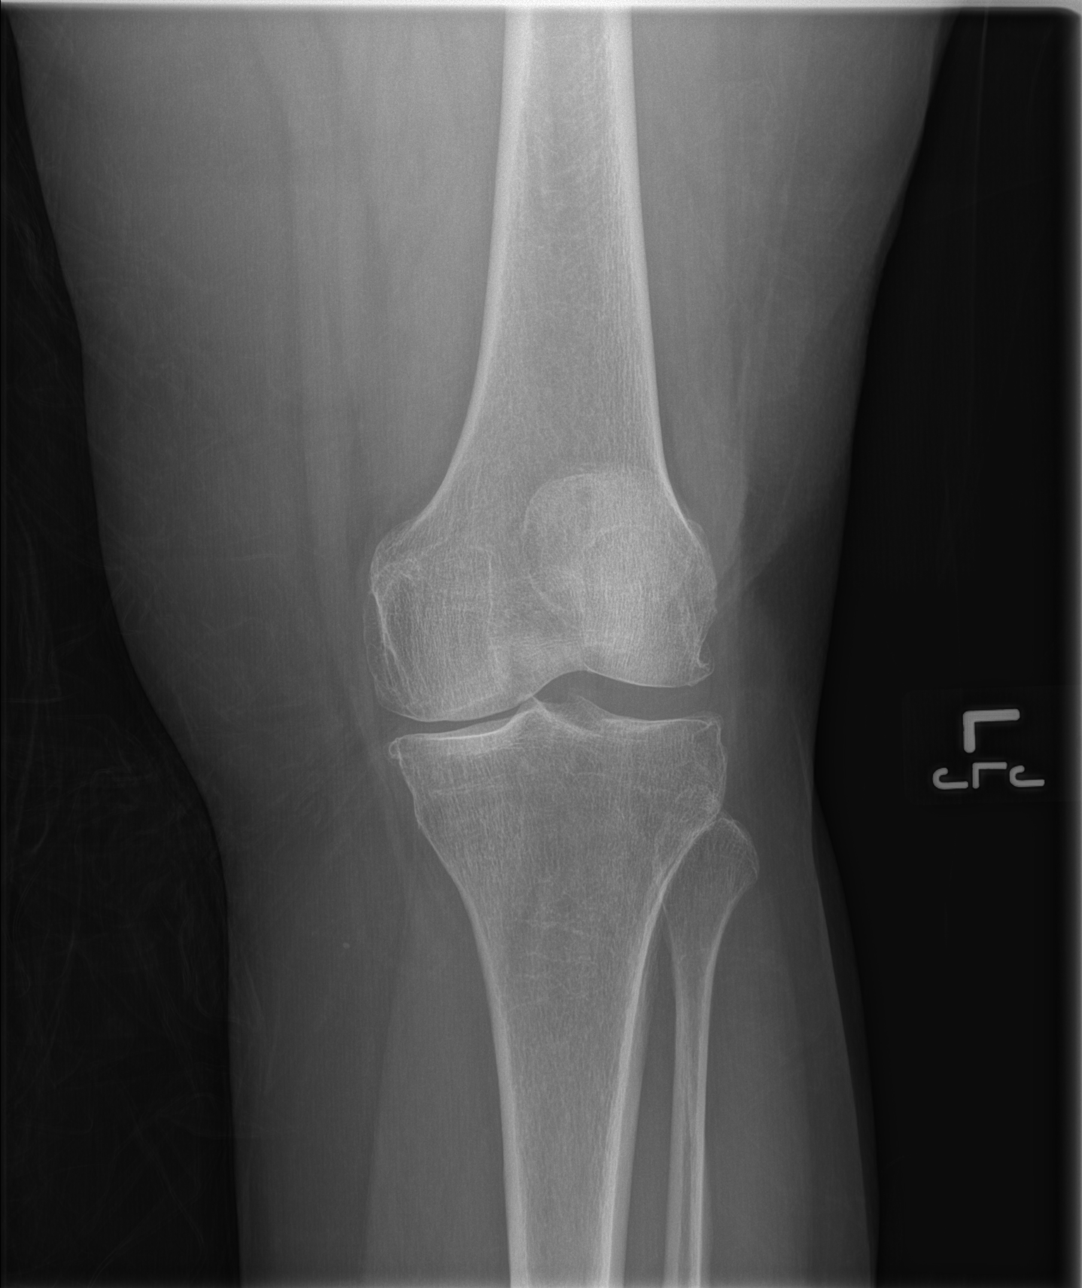

[t knee obl left (1 of 2)]
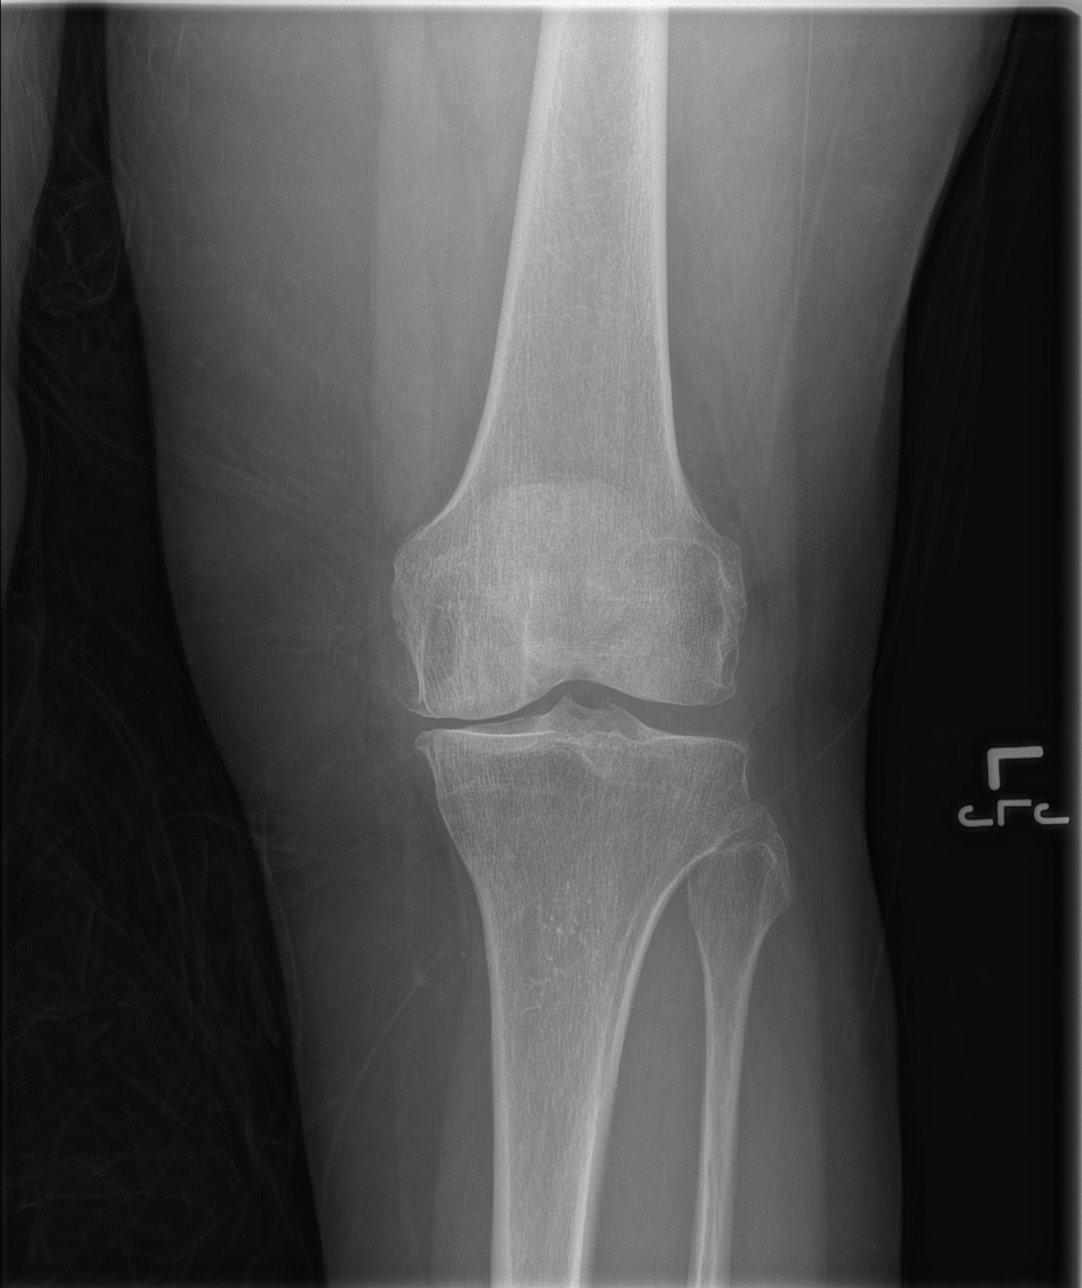

[t knee obl left (2 of 2)]
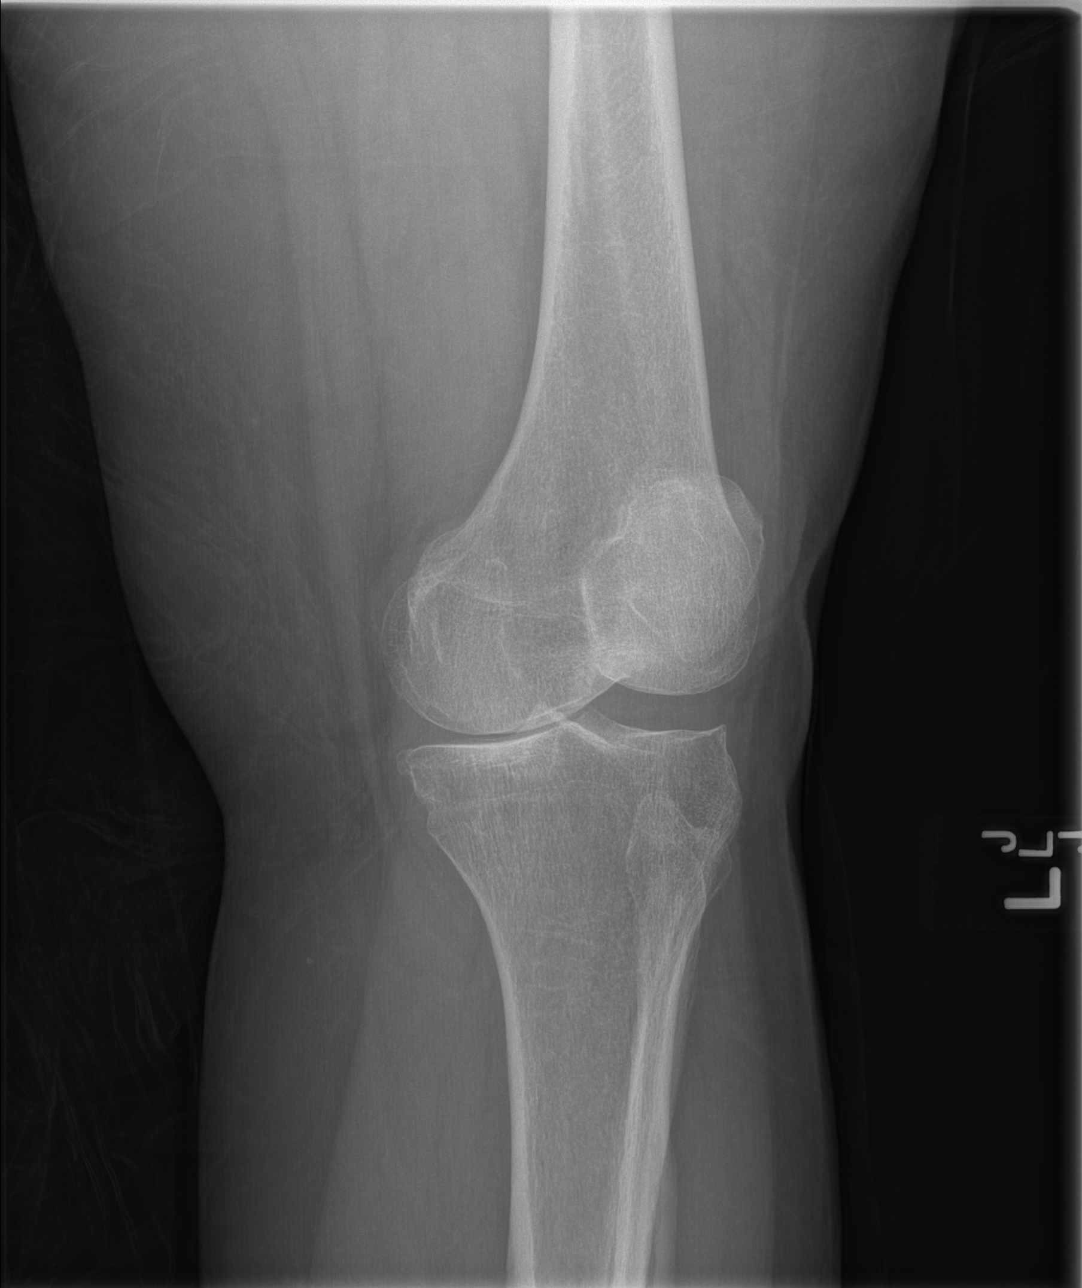

[t knee lat left]
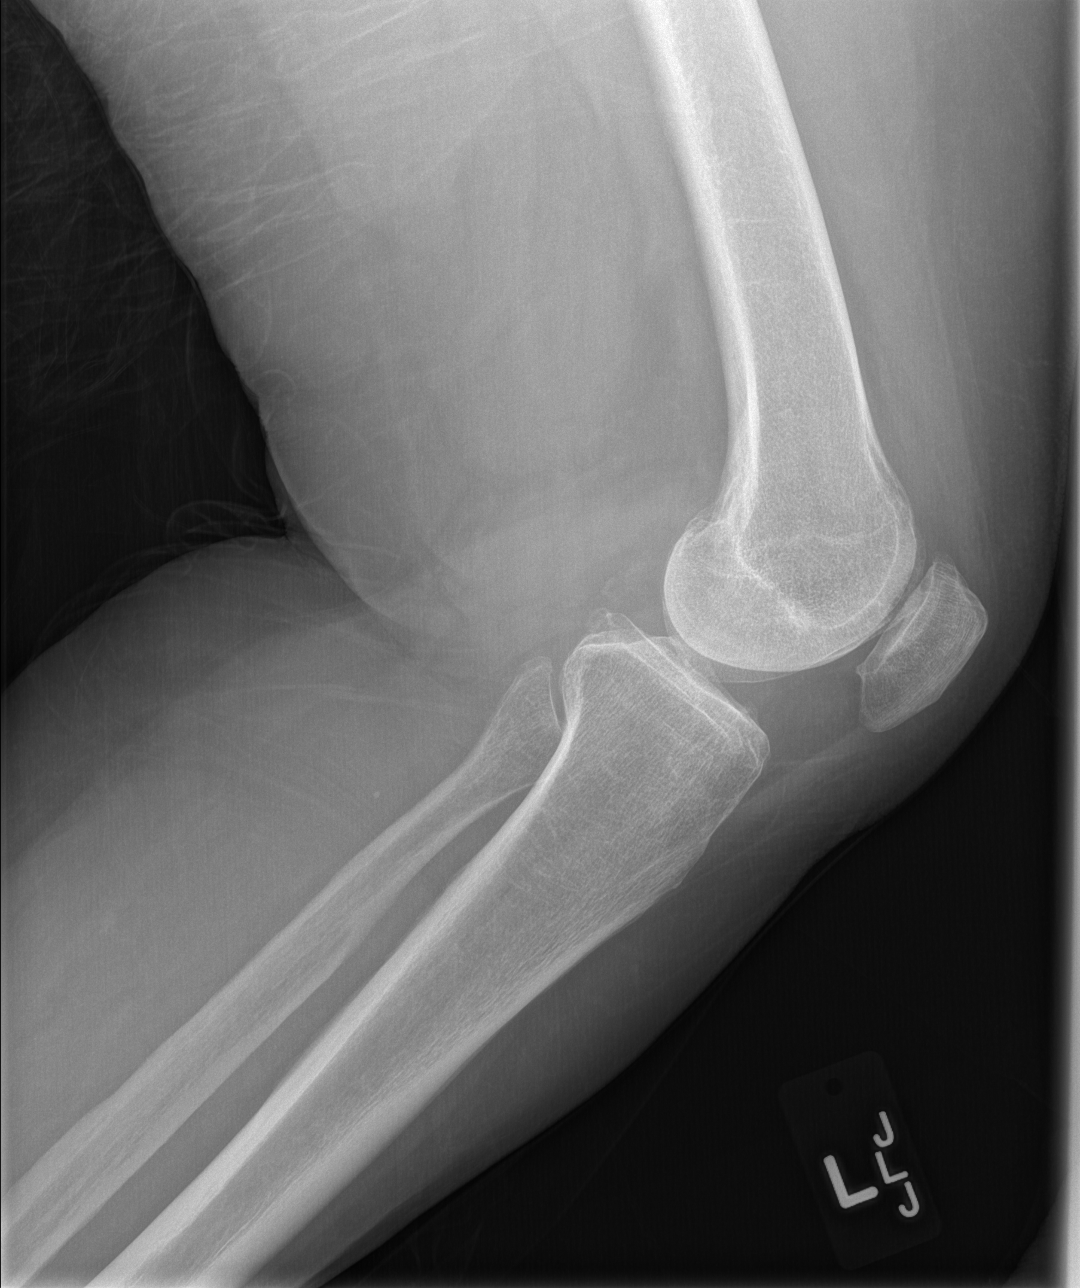

[4 of 4 positions shown; findings below may reference images not displayed]

FINDINGS: Osseous demineralization.

Medial compartment joint space narrowing and marginal spur
formation.

Mild patellofemoral joint space narrowing as well.

No acute fracture, dislocation, or bone destruction.

No knee joint effusion.
IMPRESSION: Osseous demineralization with degenerative changes LEFT knee.

No acute abnormalities.

## 2015-08-04 ENCOUNTER — Non-Acute Institutional Stay (SKILLED_NURSING_FACILITY): Payer: Medicare Other | Admitting: Internal Medicine

## 2015-08-04 ENCOUNTER — Encounter: Payer: Self-pay | Admitting: Internal Medicine

## 2015-08-04 DIAGNOSIS — F03918 Unspecified dementia, unspecified severity, with other behavioral disturbance: Secondary | ICD-10-CM

## 2015-08-04 DIAGNOSIS — E559 Vitamin D deficiency, unspecified: Secondary | ICD-10-CM

## 2015-08-04 DIAGNOSIS — F0391 Unspecified dementia with behavioral disturbance: Secondary | ICD-10-CM

## 2015-08-04 DIAGNOSIS — F3131 Bipolar disorder, current episode depressed, mild: Secondary | ICD-10-CM

## 2015-08-04 NOTE — Progress Notes (Signed)
Patient ID: Pam Jackson, female   DOB: Dec 22, 1940, 75 y.o.   MRN: [id number]      [facility] - [facility]    Chief Complaint  Patient presents with  . Medical Management of Chronic Issues    Routine Visit   Allergies: valtrex  Code status: Full Code  HPI 75 y/o female patient is seen for routine visit. She has flat affect, not happy being in the facility. She would like to be home instead but does not have anyone to provide care at home. She has PMH of dementia, bipolar disorder, depression, hypothyroidism, GERD among others. She has been at her baseline otherwise. No new concerns from the staff. She was treated for UTI last month. Currently symptom free.   Review of Systems  Constitutional: Negative for fever, chills, diaphoresis.  HENT: Negative for congestion, sore throat Respiratory: Negative for cough, shortness of breath and wheezing.   Cardiovascular: Negative for chest pain, palpitations, leg swelling.  Gastrointestinal: Negative for heartburn, nausea, vomiting, abdominal pain Musculoskeletal: Negative for falls   Past medical history reviewed  Medication reviewed. See [facility]   Medication List       This list is accurate as of: 08/04/15 10:36 AM.  Always use your most recent med list.               acetaminophen 650 MG CR tablet  Commonly known as:  TYLENOL  Take 650 mg by mouth 2 (two) times daily.     aspirin 81 MG chewable tablet  Chew 81 mg by mouth daily.     cholecalciferol 1000 units tablet  Commonly known as:  VITAMIN D  Take 2,000 Units by mouth daily.     divalproex 500 MG DR tablet  Commonly known as:  DEPAKOTE  Take 1,000 mg by mouth at bedtime.     FLUoxetine 40 MG capsule  Commonly known as:  PROZAC  Take 40 mg by mouth daily.     lamoTRIgine 150 MG tablet  Commonly known as:  LAMICTAL  Take 150 mg by mouth daily.     levothyroxine 50 MCG tablet  Commonly known as:  SYNTHROID, LEVOTHROID  Take 50 mcg by  mouth daily before breakfast.     methocarbamol 500 MG tablet  Commonly known as:  ROBAXIN  Take 500 mg by mouth. Bid at 9 am and 9 pm with additional one tablet at 2 pm if needed     nystatin cream  Commonly known as:  MYCOSTATIN  Apply topically under Balmex to groin and buttock crease for fungal rash every shift     oxyCODONE-acetaminophen 5-325 MG tablet  Commonly known as:  PERCOCET/ROXICET  Take one tablet by mouth every 12 hours at 9am and 9pm for pain. Do not exceed 4gm of Tylenol in 24 hours     vitamin B-12 1000 MCG tablet  Commonly known as:  CYANOCOBALAMIN  Take 1,000 mcg by mouth daily.     zinc oxide 11.3 % Crea cream  Commonly known as:  BALMEX  Apply 1 application topically. Apply cream to groin and buttock crease every shift for redness and preventation        Physical exam BP 109/63 mmHg  Pulse 84  Temp(Src) 98.2 F (36.8 C) (Oral)  Resp 20  Ht  (1.626 m)  Wt 200 lb 9.6 oz (90.992 kg)  BMI 34.42 kg/m2  SpO2 99%  Wt Readings from Last 3 Encounters:  08/04/15 200 lb 9.6 oz (  90.992 kg)  05/13/15 200 lb 1.6 oz (90.765 kg)  02/24/15 202 lb 14.4 oz (92.035 kg)   General- elderly female obese in no acute distress Head- atraumatic, normocephalic Eyes- PERRLA, EOMI, no pallor, no icterus Neck- no lymphadenopathy Cardiovascular- normal s1,s2, no murmurs, trace edema of both legs Respiratory- bilateral clear to auscultation, no wheeze, no rhonchi, no crackles Abdomen- bowel sounds present, soft, non tender GU- has erythema in labia area with purulent drainage Musculoskeletal- able to move all 4 extremities, lower extremity weakness more compared to upper extremity Neurological- no focal deficit, oriented to self Skin- warm and dry   Labs reviewed CBC Latest Ref Rng 06/10/2015 04/04/2014 03/14/2014  WBC - 9.9 6.5 6.6  Hemoglobin 12.0 - 16.0 g/dL 16.1 11.6(A) 13.0  Hematocrit 36 - 46 % 41 36 38.7  Platelets 150 - 399 K/L 190 133(A) 169   CMP Latest  Ref Rng 04/04/2014 03/14/2014 02/16/2014  Glucose 70 - 99 mg/dL - 94 096(E)  BUN 4 - 21 mg/dL 20 20 45(W)  Creatinine 0.5 - 1.1 mg/dL 0.6 0.98 1.19  Sodium 147 - 147 mmol/L 141 146 143  Potassium 3.4 - 5.3 mmol/L 3.9 4.8 4.3  Chloride 96 - 112 mEq/L - 106 104  CO2 19 - 32 mEq/L - 27 27  Calcium 8.4 - 10.5 mg/dL - 9.4 9.1  Total Protein 6.0 - 8.3 g/dL - 6.3 -  Total Bilirubin 0.3 - 1.2 mg/dL - 8.2(N) -  Alkaline Phos 39 - 117 U/L - 64 -  AST 0 - 37 U/L - 13 -  ALT 0 - 35 U/L - 10 -   Lab Results  Component Value Date   TSH 2.29 04/04/2014   03/31/15 wbc 9.1, hb 12.5, hct 39.5, mcv 101.5, na 144, k 3.9, bun 18, cr 0.66, lft wnl, vitamin b12 357, vit d 32.79, valproic acid 76.2, tsh 2.45   Assessement/plan  Vitamin d deficiency Stable vitamin d level. Continue vitamin d supplement  Bipolar disorder Continue lamictal and prozac current regimen with depakote, followed by psych services, monitor  Dementia With behavior disturbance, continue mood medication as above. Continue assistance with ADLs, pressure ulcer prophylaxis and fall precautions  Oneal Grout, MD  [facility] [phone] (Monday-Friday 8 am - 5 pm) [phone] (afterhours)

## 2015-08-24 ENCOUNTER — Other Ambulatory Visit: Payer: Self-pay | Admitting: *Deleted

## 2015-08-24 MED ORDER — OXYCODONE-ACETAMINOPHEN 5-325 MG PO TABS: ORAL_TABLET | ORAL | Status: DC

## 2015-08-24 NOTE — Telephone Encounter (Signed)
[facility] 

## 2015-08-26 ENCOUNTER — Encounter: Payer: Self-pay | Admitting: Internal Medicine

## 2015-08-26 ENCOUNTER — Non-Acute Institutional Stay (SKILLED_NURSING_FACILITY): Payer: Medicare Other | Admitting: Internal Medicine

## 2015-08-26 DIAGNOSIS — E039 Hypothyroidism, unspecified: Secondary | ICD-10-CM

## 2015-08-26 DIAGNOSIS — M4806 Spinal stenosis, lumbar region: Secondary | ICD-10-CM

## 2015-08-26 DIAGNOSIS — M48061 Spinal stenosis, lumbar region without neurogenic claudication: Secondary | ICD-10-CM

## 2015-08-26 DIAGNOSIS — D519 Vitamin B12 deficiency anemia, unspecified: Secondary | ICD-10-CM

## 2015-08-26 NOTE — Progress Notes (Signed)
Patient ID: Pam Jackson, female   DOB: 1941/05/21, 75 y.o.   MRN: [id number]      [facility] - [facility]    Chief Complaint  Patient presents with  . Medical Management of Chronic Issues    Routine Visit   Allergies: valtrex  Code status: Full Code  HPI 75 y/o female patient is seen for routine visit. She has been at her baseline, in her bed and denies any concern this visit  Review of Systems  Constitutional: Negative for fever HENT: Negative for congestion, sore throat Respiratory: Negative for cough, shortness of breath and wheezing.   Cardiovascular: Negative for chest pain, palpitations, leg swelling.  Gastrointestinal: Negative for heartburn, nausea, vomiting, abdominal pain Musculoskeletal: Negative for falls   Past Medical History  Diagnosis Date  . Bipolar 1 disorder (HCC)   . Depression   . GERD (gastroesophageal reflux disease)   . Anxiety   . Thyroid disease     hypothyroid  . Spinal stenosis   . Genital herpes   . Vitamin D deficiency      Medication reviewed. See [facility]   Medication List       This list is accurate as of: 08/26/15  2:57 PM.  Always use your most recent med list.               acetaminophen 650 MG CR tablet  Commonly known as:  TYLENOL  Take 650 mg by mouth 2 (two) times daily. Take 2 tablets = 1300 mg by mouth twice daily     aspirin 81 MG chewable tablet  Chew 81 mg by mouth daily.     cholecalciferol 1000 units tablet  Commonly known as:  VITAMIN D  Take 2,000 Units by mouth daily.     divalproex 500 MG DR tablet  Commonly known as:  DEPAKOTE  Take 1,000 mg by mouth at bedtime.     FLUoxetine 40 MG capsule  Commonly known as:  PROZAC  Take 40 mg by mouth daily.     lamoTRIgine 150 MG tablet  Commonly known as:  LAMICTAL  Take 150 mg by mouth daily.     levothyroxine 50 MCG tablet  Commonly known as:  SYNTHROID, LEVOTHROID  Take 50 mcg by mouth daily before breakfast.     methocarbamol 500 MG tablet  Commonly known as:  ROBAXIN  Take 500 mg by mouth 2 (two) times daily as needed for muscle spasms. Take one tablet at 2 pm if needed. Give 1 tablet by mouth at 9 am and 9 pm     nystatin cream  Commonly known as:  MYCOSTATIN  Apply topically under Balmex to groin and buttock crease for fungal rash every shift     oxyCODONE-acetaminophen 5-325 MG tablet  Commonly known as:  PERCOCET/ROXICET  Take 1 tablet by mouth as needed for severe pain (at 2 pm). Give 1 tablet by mouth every 12 hours at 9 am and 9 pm for Lumbar Stenosis     vitamin B-12 1000 MCG tablet  Commonly known as:  CYANOCOBALAMIN  Take 1,000 mcg by mouth daily.     zinc oxide 11.3 % Crea cream  Commonly known as:  BALMEX  Apply 1 application topically. Apply cream to groin and buttock crease every shift for redness and preventation        Physical exam BP 99/56 mmHg  Pulse 86  Temp(Src) 98 F (36.7 C) (Oral)  Resp 18  Ht  (  1.626 m)  Wt 202 lb 12.8 oz (91.989 kg)  BMI 34.79 kg/m2  SpO2 96%  Wt Readings from Last 3 Encounters:  08/26/15 202 lb 12.8 oz (91.989 kg)  08/04/15 200 lb 9.6 oz (90.992 kg)  05/13/15 200 lb 1.6 oz (90.765 kg)   General- elderly female obese in no acute distress Head- atraumatic, normocephalic Eyes- PERRLA, EOMI, no pallor, no icterus Neck- no lymphadenopathy Cardiovascular- normal s1,s2, no murmurs, trace edema of both legs Respiratory- bilateral clear to auscultation, no wheeze, no rhonchi, no crackles Abdomen- bowel sounds present, soft, non tender Musculoskeletal- able to move all 4 extremities, lower extremity weakness more compared to upper extremity Neurological- no focal deficit, oriented to self Skin- warm and dry   Labs reviewed CBC Latest Ref Rng 06/10/2015 04/04/2014 03/14/2014  WBC - 9.9 6.5 6.6  Hemoglobin 12.0 - 16.0 g/dL 09.8 11.6(A) 13.0  Hematocrit 36 - 46 % 41 36 38.7  Platelets 150 - 399 K/L 190 133(A) 169   CMP Latest Ref  Rng 04/04/2014 03/14/2014 02/16/2014  Glucose 70 - 99 mg/dL - 94 119(J)  BUN 4 - 21 mg/dL 20 20 47(W)  Creatinine 0.5 - 1.1 mg/dL 0.6 2.95 6.21  Sodium 308 - 147 mmol/L 141 146 143  Potassium 3.4 - 5.3 mmol/L 3.9 4.8 4.3  Chloride 96 - 112 mEq/L - 106 104  CO2 19 - 32 mEq/L - 27 27  Calcium 8.4 - 10.5 mg/dL - 9.4 9.1  Total Protein 6.0 - 8.3 g/dL - 6.3 -  Total Bilirubin 0.3 - 1.2 mg/dL - 6.5(H) -  Alkaline Phos 39 - 117 U/L - 64 -  AST 0 - 37 U/L - 13 -  ALT 0 - 35 U/L - 10 -   Lab Results  Component Value Date   TSH 2.29 04/04/2014    Assessement/plan  Hypothyroidism Check tsh and free t4, continue synthroid 50 mcg daily  Lumbar spinal stenosis Continue tylenol as needed for pain and robaxin for muscle spasm. No changes made  b12 deficiency Continue b12 supplement for now    [facility], MD  [facility] [phone] (Monday-Friday 8 am - 5 pm) [phone] (afterhours)

## 2015-09-10 LAB — TSH
TSH: 2.1 u[IU]/mL (ref 0.41–5.90)
TSH: 2.17 u[IU]/mL (ref 0.41–5.90)
TSH: 2.17 u[IU]/mL (ref 0.41–5.90)
TSH: 2.17 u[IU]/mL (ref 0.41–5.90)
TSH: 2.17 u[IU]/mL (ref <=5.90)

## 2015-09-17 IMAGING — CR DG SHOULDER 2+V*R*
3 series · 3 of 3 positions shown · non-contrast
Comparison: None.

CLINICAL DATA: fall

EXAM:
RIGHT SHOULDER - 2+ VIEW

[x shoulder ap right (1 of 3)]
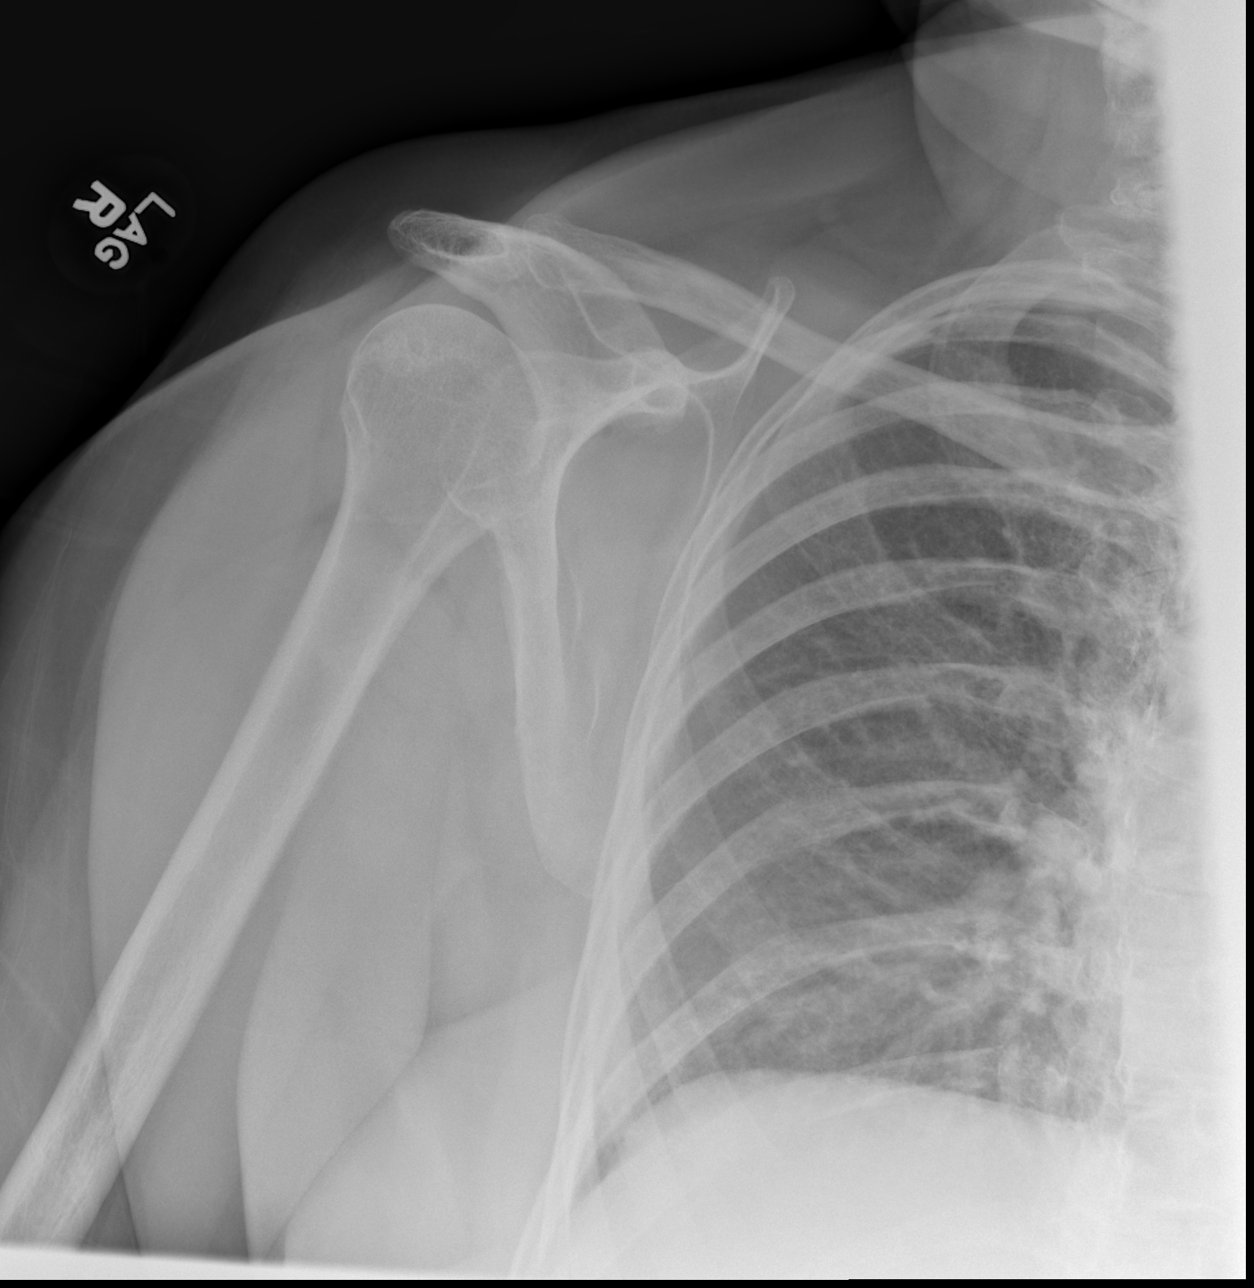

[x shoulder ap right (2 of 3)]
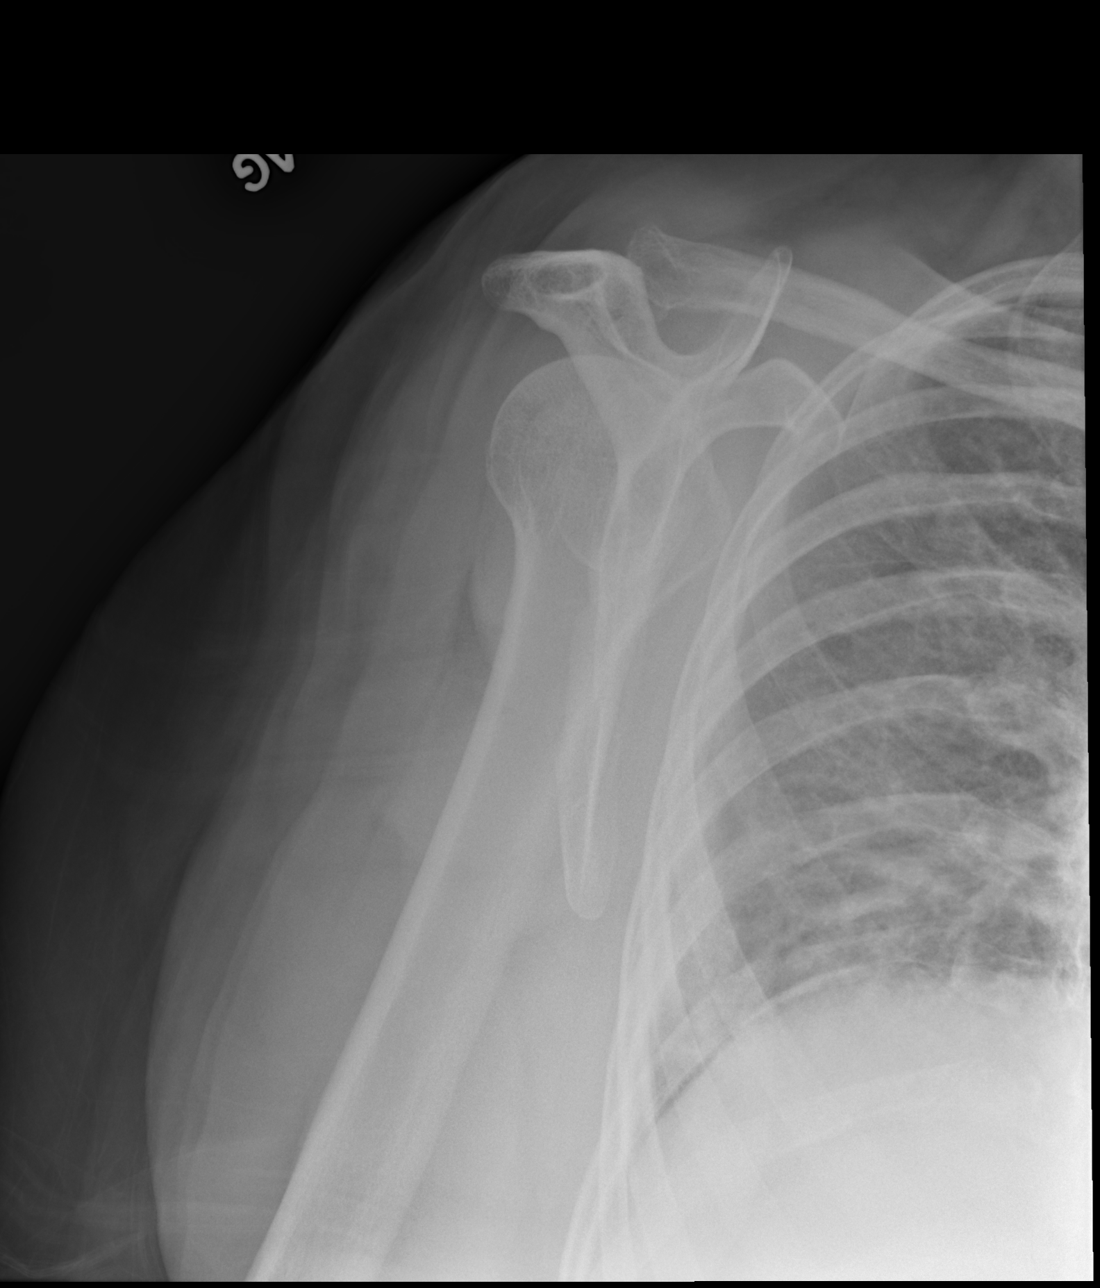

[x shoulder ap right (3 of 3)]
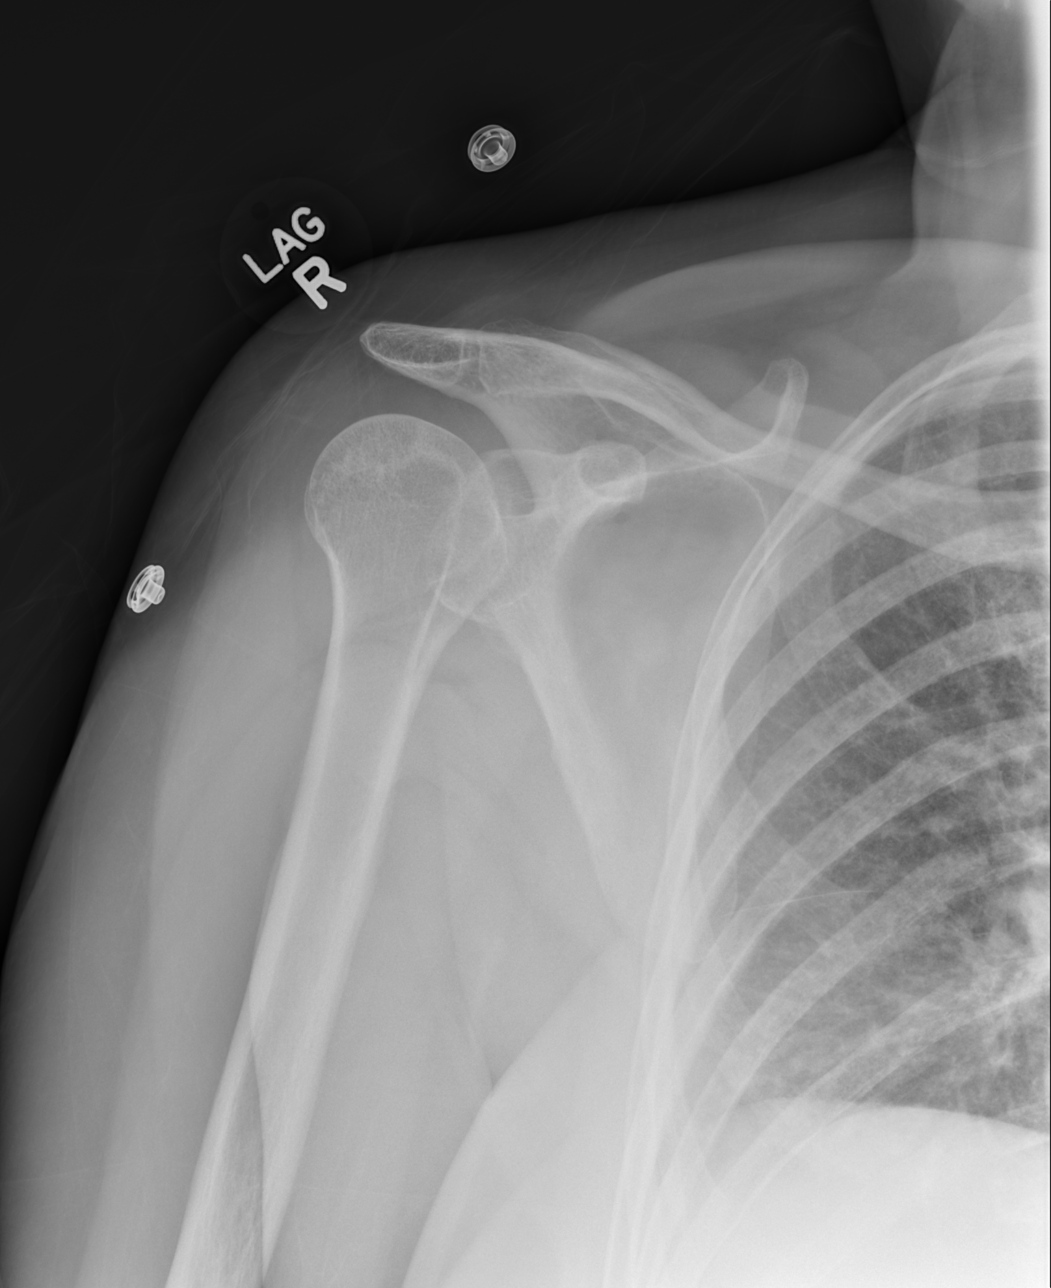

[3 of 3 positions shown; findings below may reference images not displayed]

FINDINGS: There is no evidence of fracture or dislocation. There is no
evidence of arthropathy or other focal bone abnormality. Soft
tissues are unremarkable.
IMPRESSION: Negative.

## 2015-09-21 ENCOUNTER — Non-Acute Institutional Stay (SKILLED_NURSING_FACILITY): Payer: Medicare Other | Admitting: Internal Medicine

## 2015-09-21 ENCOUNTER — Encounter: Payer: Self-pay | Admitting: Internal Medicine

## 2015-09-21 DIAGNOSIS — E038 Other specified hypothyroidism: Secondary | ICD-10-CM | POA: Diagnosis not present

## 2015-09-21 DIAGNOSIS — Z1239 Encounter for other screening for malignant neoplasm of breast: Secondary | ICD-10-CM | POA: Diagnosis not present

## 2015-09-21 DIAGNOSIS — Z1211 Encounter for screening for malignant neoplasm of colon: Secondary | ICD-10-CM | POA: Diagnosis not present

## 2015-09-21 NOTE — Progress Notes (Signed)
Patient ID: Pam RothmanConstance C Jackson, female   DOB: 03/03/1941, 75 y.o.   MRN: [id number]      [facility]    Chief Complaint  Patient presents with  . Medical Management of Chronic Issues    Routine Visit   Allergies: valtrex  Code status: Full Code  HPI 75 y/o female patient is seen for routine visit. She has been at her baseline. denies any concern this visit  Review of Systems  Constitutional: Negative for fever HENT: Negative for congestion, sore throat Respiratory: Negative for cough, shortness of breath and wheezing.   Cardiovascular: Negative for chest pain, palpitations, leg swelling.  Gastrointestinal: Negative for heartburn, nausea, vomiting, abdominal pain Musculoskeletal: Negative for falls   Past Medical History  Diagnosis Date  . Bipolar 1 disorder (HCC)   . Depression   . GERD (gastroesophageal reflux disease)   . Anxiety   . Thyroid disease     hypothyroid  . Spinal stenosis   . Genital herpes   . Vitamin D deficiency      [facility]. [facility]   Medication List       This list is accurate as of: 09/21/15  3:43 PM.  Always use your most recent med list.               acetaminophen 650 MG CR tablet  Commonly known as:  TYLENOL  Take 650 mg by mouth 2 (two) times daily. Take 2 tablets = 1300 mg by mouth twice daily     aspirin 81 MG chewable tablet  Chew 81 mg by mouth daily.     cholecalciferol 1000 units tablet  Commonly known as:  VITAMIN D  Take 2,000 Units by mouth daily.     divalproex 500 MG DR tablet  Commonly known as:  DEPAKOTE  Take 1,000 mg by mouth at bedtime.     FLUoxetine 40 MG capsule  Commonly known as:  PROZAC  Take 40 mg by mouth daily.     lamoTRIgine 150 MG tablet  Commonly known as:  LAMICTAL  Take 150 mg by mouth daily.     levothyroxine 50 MCG tablet  Commonly known as:  SYNTHROID, LEVOTHROID  Take 50 mcg by mouth daily before breakfast.     methocarbamol 500 MG  tablet  Commonly known as:  ROBAXIN  Take 500 mg by mouth 2 (two) times daily as needed for muscle spasms. Take one tablet at 2 pm if needed. Give 1 tablet by mouth at 9 am and 9 pm     nystatin cream  Commonly known as:  MYCOSTATIN  Apply topically under Balmex to groin and buttock crease for fungal rash every shift     oxyCODONE-acetaminophen 5-325 MG tablet  Commonly known as:  PERCOCET/ROXICET  Take 1 tablet by mouth as needed for severe pain (at 2 pm). Give 1 tablet by mouth every 12 hours at 9 am and 9 pm for Lumbar Stenosis     vitamin B-12 1000 MCG tablet  Commonly known as:  CYANOCOBALAMIN  Take 1,000 mcg by mouth daily.     zinc oxide 11.3 % Crea cream  Commonly known as:  BALMEX  Apply 1 application topically. Apply cream to groin and buttock crease every shift for redness and preventation        Physical exam BP 110/60 mmHg  Pulse 80  Temp(Src) 97.4 F (36.3 C) (Oral)  Resp 18  Ht 5\' 4"  (1.626 m)  Wt 197 lb 3.2 oz (89.449 kg)  BMI 33.83 kg/m2  SpO2 97%  Wt Readings from Last 3 Encounters:  09/21/15 197 lb 3.2 oz (89.449 kg)  08/26/15 202 lb 12.8 oz (91.989 kg)  08/04/15 200 lb 9.6 oz (90.992 kg)   General- elderly female obese in no acute distress Head- atraumatic, normocephalic Eyes- PERRLA, EOMI, no pallor, no icterus Neck- no lymphadenopathy Cardiovascular- normal s1,s2, no murmurs, trace edema of both legs Respiratory- bilateral clear to auscultation, no wheeze, no rhonchi, no crackles Abdomen- bowel sounds present, soft, non tender Musculoskeletal- able to move all 4 extremities, lower extremity weakness more compared to upper extremity Neurological- no focal deficit, oriented to self Skin- warm and dry   Labs reviewed CBC Latest Ref Rng 06/10/2015 04/04/2014 03/14/2014  WBC - 9.9 6.5 6.6  Hemoglobin 12.0 - 16.0 g/dL 16.1 11.6(A) 13.0  Hematocrit 36 - 46 % 41 36 38.7  Platelets 150 - 399 K/L 190 133(A) 169   CMP Latest Ref Rng 04/04/2014  03/14/2014 02/16/2014  Glucose 70 - 99 mg/dL - 94 096(E)  BUN 4 - 21 mg/dL 20 20 45(W)  Creatinine 0.5 - 1.1 mg/dL 0.6 0.98 1.19  Sodium 147 - 147 mmol/L 141 146 143  Potassium 3.4 - 5.3 mmol/L 3.9 4.8 4.3  Chloride 96 - 112 mEq/L - 106 104  CO2 19 - 32 mEq/L - 27 27  Calcium 8.4 - 10.5 mg/dL - 9.4 9.1  Total Protein 6.0 - 8.3 g/dL - 6.3 -  Total Bilirubin 0.3 - 1.2 mg/dL - 8.2(N) -  Alkaline Phos 39 - 117 U/L - 64 -  AST 0 - 37 U/L - 13 -  ALT 0 - 35 U/L - 10 -   Lab Results  Component Value Date   TSH 2.17 09/10/2015   Lab Results  Component Value Date   HGBA1C 5.4 04/04/2014    Assessement/plan  Breast cancer screening advised patient on breast cancer screening. Refuses breast exam and mammogram  Colon cancer screen FOBT X 1 for now and evaluate  Hypothyroidism tsh reviewed. continue synthroid 50 mcg daily    Oneal Grout, MD  [facility] [phone] (Monday-Friday 8 am - 5 pm) [phone] (afterhours)

## 2015-09-23 ENCOUNTER — Other Ambulatory Visit: Payer: Self-pay

## 2015-09-23 MED ORDER — OXYCODONE-ACETAMINOPHEN 5-325 MG PO TABS: 1.0000 | ORAL_TABLET | ORAL | Status: DC | PRN

## 2015-09-23 NOTE — Telephone Encounter (Signed)
[facility]  [address] [place] [address]  Phone: [phone]  Fax: [phone]  

## 2015-10-01 LAB — HEPATIC FUNCTION PANEL
ALT: 8 U/L (ref 7–35)
AST: 10 U/L — AB (ref 13–35)
Alkaline Phosphatase: 43 U/L (ref 25–125)
BILIRUBIN, TOTAL: 0.3 mg/dL

## 2015-10-01 LAB — LIPID PANEL
Cholesterol: 204 mg/dL — AB (ref 0–200)
HDL: 44 mg/dL (ref 35–70)
LDL CALC: 37 mg/dL
Triglycerides: 184 mg/dL — AB (ref 40–160)

## 2015-10-01 LAB — CBC AND DIFFERENTIAL
HEMATOCRIT: 38 % (ref 36–46)
HEMOGLOBIN: 12.2 g/dL (ref 12.0–16.0)
Platelets: 218 10*3/uL (ref 150–399)
WBC: 9.2 10*3/mL

## 2015-10-01 LAB — HEMOGLOBIN A1C: Hemoglobin A1C: 5.5

## 2015-10-01 LAB — BASIC METABOLIC PANEL
BUN: 13 mg/dL (ref 4–21)
Creatinine: 0.7 mg/dL (ref 0.5–1.1)
GLUCOSE: 92 mg/dL
POTASSIUM: 4.6 mmol/L (ref 3.4–5.3)
Sodium: 147 mmol/L (ref 137–147)

## 2015-10-01 LAB — TSH: TSH: 5.5 u[IU]/mL (ref 0.41–5.90)

## 2015-10-22 ENCOUNTER — Encounter: Payer: Self-pay | Admitting: Internal Medicine

## 2015-10-22 ENCOUNTER — Non-Acute Institutional Stay (SKILLED_NURSING_FACILITY): Payer: Medicare Other | Admitting: Internal Medicine

## 2015-10-22 DIAGNOSIS — E559 Vitamin D deficiency, unspecified: Secondary | ICD-10-CM | POA: Diagnosis not present

## 2015-10-22 DIAGNOSIS — E038 Other specified hypothyroidism: Secondary | ICD-10-CM

## 2015-10-22 DIAGNOSIS — E785 Hyperlipidemia, unspecified: Secondary | ICD-10-CM

## 2015-10-22 NOTE — Progress Notes (Signed)
Patient ID: Pam Jackson, female   DOB: October 18, 1940, 75 y.o.   MRN: [id number]      [facility] - [facility]    Chief Complaint  Patient presents with  . Medical Management of Chronic Issues    Routine Visit   Allergies: valtrex  Code status: Full Code  HPI 75 y/o female patient is seen for routine visit. She has been at her baseline. denies any concern this visit. She has been started on statin for cholesterol. She denies any concern this vist. No new concern from patient or nursing staff.   Review of Systems  Constitutional: Negative for fever HENT: Negative for congestion, sore throat Respiratory: Negative for cough, shortness of breath and wheezing.   Cardiovascular: Negative for chest pain, palpitations, leg swelling.  Gastrointestinal: Negative for heartburn, nausea, vomiting, abdominal pain Musculoskeletal: Negative for falls Psychiatry: mood has been stable   Past Medical History  Diagnosis Date  . Bipolar 1 disorder (HCC)   . Depression   . GERD (gastroesophageal reflux disease)   . Anxiety   . Thyroid disease     hypothyroid  . Spinal stenosis   . Genital herpes   . Vitamin D deficiency      Medication reviewed. See [facility]   Medication List       This list is accurate as of: 10/22/15 10:41 AM.  Always use your most recent med list.               acetaminophen 650 MG CR tablet  Commonly known as:  TYLENOL  Take 650 mg by mouth 2 (two) times daily. Take 2 tablets = 1300 mg by mouth twice daily     aspirin 81 MG chewable tablet  Chew 81 mg by mouth daily.     ATIVAN IJ  Inject 1 mg as directed. Give 30 minutes prior to dental appointment     cholecalciferol 1000 units tablet  Commonly known as:  VITAMIN D  Take 2,000 Units by mouth daily.     divalproex 500 MG DR tablet  Commonly known as:  DEPAKOTE  Take 1,000 mg by mouth at bedtime.     FLUoxetine 40 MG capsule  Commonly known as:  PROZAC  Take 40 mg by mouth daily.      lamoTRIgine 150 MG tablet  Commonly known as:  LAMICTAL  Take 150 mg by mouth daily.     levothyroxine 50 MCG tablet  Commonly known as:  SYNTHROID, LEVOTHROID  Take 50 mcg by mouth daily before breakfast.     methocarbamol 500 MG tablet  Commonly known as:  ROBAXIN  Take 500 mg by mouth 2 (two) times daily as needed for muscle spasms. Take one tablet at 2 pm if needed. Give 1 tablet by mouth at 9 am and 9 pm     nystatin cream  Commonly known as:  MYCOSTATIN  Apply topically under Balmex to groin and buttock crease for fungal rash every shift     oxyCODONE-acetaminophen 5-325 MG tablet  Commonly known as:  PERCOCET/ROXICET  Take 1 tablet by mouth as needed for severe pain (at 2 pm). Give 1 tablet by mouth every 12 hours at 9 am and 9 pm for Lumbar Stenosis     simvastatin 20 MG tablet  Commonly known as:  ZOCOR  Take 20 mg by mouth at bedtime.     vitamin B-12 1000 MCG tablet  Commonly known as:  CYANOCOBALAMIN  Take 1,000 mcg by  mouth daily.     zinc oxide 11.3 % Crea cream  Commonly known as:  BALMEX  Apply 1 application topically. Apply cream to groin and buttock crease every shift for redness and preventation        Physical exam BP 106/58 mmHg  Pulse 78  Temp(Src) 97.4 F (36.3 C) (Oral)  Resp 20  Ht 5\' 4"  (1.626 m)  Wt 198 lb (89.812 kg)  BMI 33.97 kg/m2  SpO2 97%  Wt Readings from Last 3 Encounters:  10/22/15 198 lb (89.812 kg)  09/21/15 197 lb 3.2 oz (89.449 kg)  08/26/15 202 lb 12.8 oz (91.989 kg)   General- elderly female obese in no acute distress Head- atraumatic, normocephalic Eyes- PERRLA, EOMI, no pallor, no icterus Neck- no lymphadenopathy Cardiovascular- normal s1,s2, no murmurs, trace edema of both legs Respiratory- bilateral clear to auscultation, no wheeze, no rhonchi, no crackles Abdomen- bowel sounds present, soft, non tender Musculoskeletal- able to move all 4 extremities, lower extremity weakness more compared to upper  extremity Neurological- no focal deficit, oriented to self Skin- warm and dry   Labs reviewed CBC Latest Ref Rng 10/01/2015 06/10/2015 04/04/2014  WBC - 9.2 9.9 6.5  Hemoglobin 12.0 - 16.0 g/dL 16.112.2 09.613.5 11.6(A)  Hematocrit 36 - 46 % 38 41 36  Platelets 150 - 399 K/L 218 190 133(A)   CMP Latest Ref Rng 10/01/2015 04/04/2014 03/14/2014  Glucose 70 - 99 mg/dL - - 94  BUN 4 - 21 mg/dL 13 20 20   Creatinine 0.5 - 1.1 mg/dL 0.7 0.6 0.450.76  Sodium 409137 - 147 mmol/L 147 141 146  Potassium 3.4 - 5.3 mmol/L 4.6 3.9 4.8  Chloride 96 - 112 mEq/L - - 106  CO2 19 - 32 mEq/L - - 27  Calcium 8.4 - 10.5 mg/dL - - 9.4  Total Protein 6.0 - 8.3 g/dL - - 6.3  Total Bilirubin 0.3 - 1.2 mg/dL - - 8.1(X0.2(L)  Alkaline Phos 25 - 125 U/L 43 - 64  AST 13 - 35 U/L 10(A) - 13  ALT 7 - 35 U/L 8 - 10   Lab Results  Component Value Date   TSH 5.50 10/01/2015   Lab Results  Component Value Date   HGBA1C 5.5 10/01/2015   Lipid Panel     Component Value Date/Time   CHOL 204* 10/01/2015   TRIG 184* 10/01/2015   HDL 44 10/01/2015   LDLCALC 37 10/01/2015     Assessement/plan  Hyperlipidemia With deranged lipid profile, started on simvastatin 20 mg qhs, monitor lft, FLP and clinically  Hypothyroidism tsh reviewed from 10/01/15 5.5 and tsh in beginning of march is 2.17. Currently on levothyroxine 50 mcg daily. Will not change her regimen. Recheck tsh with free t4 in 4 weeks  Vitamin d def Stable, continue vitamin d 2000 u daily   Oneal GroutMAHIMA Eoin Willden, MD  [facility] [phone] (Monday-Friday 8 am - 5 pm) [phone] (afterhours)

## 2015-11-13 LAB — HEPATIC FUNCTION PANEL
ALT: 7 U/L (ref 7–35)
AST: 10 U/L — AB (ref 13–35)
Alkaline Phosphatase: 42 U/L (ref 25–125)
Bilirubin, Total: 0.3 mg/dL

## 2015-11-13 LAB — BASIC METABOLIC PANEL
BUN: 14 mg/dL (ref 4–21)
CREATININE: 0.6 mg/dL (ref 0.5–1.1)
GLUCOSE: 89 mg/dL
POTASSIUM: 3.9 mmol/L (ref 3.4–5.3)
Sodium: 143 mmol/L (ref 137–147)

## 2015-11-13 LAB — TSH: TSH: 2.23 u[IU]/mL (ref 0.41–5.90)

## 2015-11-13 LAB — CBC AND DIFFERENTIAL
HCT: 36 % (ref 36–46)
Hemoglobin: 11.8 g/dL — AB (ref 12.0–16.0)
Platelets: 210 10*3/uL (ref 150–399)
WBC: 7.6 10*3/mL

## 2015-11-23 ENCOUNTER — Other Ambulatory Visit: Payer: Self-pay | Admitting: *Deleted

## 2015-11-23 MED ORDER — OXYCODONE-ACETAMINOPHEN 5-325 MG PO TABS: ORAL_TABLET | ORAL | Status: DC

## 2015-11-23 NOTE — Telephone Encounter (Signed)
[facility] 

## 2015-11-25 ENCOUNTER — Non-Acute Institutional Stay (SKILLED_NURSING_FACILITY): Payer: Medicare Other | Admitting: Internal Medicine

## 2015-11-25 ENCOUNTER — Encounter: Payer: Self-pay | Admitting: Internal Medicine

## 2015-11-25 DIAGNOSIS — E038 Other specified hypothyroidism: Secondary | ICD-10-CM | POA: Diagnosis not present

## 2015-11-25 DIAGNOSIS — G894 Chronic pain syndrome: Secondary | ICD-10-CM | POA: Diagnosis not present

## 2015-11-25 DIAGNOSIS — F3160 Bipolar disorder, current episode mixed, unspecified: Secondary | ICD-10-CM

## 2015-11-25 NOTE — Progress Notes (Signed)
Patient ID: Pam Jackson, female   DOB: 07/02/1941, 75 y.o.   MRN: [id number]      [facility]    Chief Complaint  Patient presents with  . Medical Management of Chronic Issues    Routine Visit   Allergies: valtrex  Code status: Full Code  HPI 75 y/o female patient is seen for routine visit. She complaints about being depressed these days. She has history of bipolar disorder and depression. Denies any other concerns. She has been working with psychotherapy team.   Review of Systems  Constitutional: Negative for fever HENT: Negative for congestion, sore throat Respiratory: Negative for cough, shortness of breath and wheezing.   Cardiovascular: Negative for chest pain, palpitations, leg swelling.  Gastrointestinal: Negative for heartburn, nausea, vomiting, abdominal pain Musculoskeletal: Negative for fall Psychiatry: mood has been stable   Past Medical History  Diagnosis Date  . Bipolar 1 disorder (HCC)   . Depression   . GERD (gastroesophageal reflux disease)   . Anxiety   . Thyroid disease     hypothyroid  . Spinal stenosis   . Genital herpes   . Vitamin D deficiency      [facility]. [facility]   Medication List       This list is accurate as of: 11/25/15  3:41 PM.  Always use your most recent med list.               acetaminophen 650 MG CR tablet  Commonly known as:  TYLENOL  Take 650 mg by mouth 2 (two) times daily. Take 2 tablets = 1300 mg by mouth twice daily     aspirin 81 MG chewable tablet  Chew 81 mg by mouth daily.     cholecalciferol 1000 units tablet  Commonly known as:  VITAMIN D  Take 2,000 Units by mouth daily.     divalproex 500 MG DR tablet  Commonly known as:  DEPAKOTE  Take 1,000 mg by mouth at bedtime.     FLUoxetine 40 MG capsule  Commonly known as:  PROZAC  Take 40 mg by mouth daily.     lamoTRIgine 150 MG tablet  Commonly known as:  LAMICTAL  Take 150 mg by mouth daily.     levothyroxine 50 MCG tablet  Commonly known as:  SYNTHROID, LEVOTHROID  Take 50 mcg by mouth daily before breakfast.     methocarbamol 500 MG tablet  Commonly known as:  ROBAXIN  Take 500 mg by mouth 2 (two) times daily as needed for muscle spasms. Take one tablet at 2 pm if needed. Give 1 tablet by mouth at 9 am and 9 pm     nystatin cream  Commonly known as:  MYCOSTATIN  Apply topically under Balmex to groin and buttock crease for fungal rash every shift     oxyCODONE-acetaminophen 5-325 MG tablet  Commonly known as:  PERCOCET/ROXICET  Take one tablet by mouth every 12 hours for chronic pain. Do not exceed 4gm of Tylenol in 24 hours     simvastatin 20 MG tablet  Commonly known as:  ZOCOR  Take 20 mg by mouth at bedtime.     vitamin B-12 1000 MCG tablet  Commonly known as:  CYANOCOBALAMIN  Take 1,000 mcg by mouth daily.     zinc oxide 11.3 % Crea cream  Commonly known as:  BALMEX  Apply 1 application topically. Apply cream to groin and buttock crease every shift for redness and preventation  Physical exam BP 138/78 mmHg  Pulse 77  Temp(Src) 98 F (36.7 C) (Oral)  Resp 18  Ht  (1.626 m)  Wt 203 lb 9.6 oz (92.352 kg)  BMI 34.93 kg/m2  Wt Readings from Last 3 Encounters:  11/25/15 203 lb 9.6 oz (92.352 kg)  10/22/15 198 lb (89.812 kg)  09/21/15 197 lb 3.2 oz (89.449 kg)   General- elderly female obese in no acute distress Head- atraumatic, normocephalic Eyes- PERRLA, EOMI, no pallor, no icterus Neck- no lymphadenopathy Cardiovascular- normal s1,s2, no murmurs, trace edema of both legs Respiratory- bilateral clear to auscultation, no wheeze, no rhonchi, no crackles Abdomen- bowel sounds present, soft, non tender Musculoskeletal- able to move all 4 extremities, lower extremity weakness more compared to upper extremity Neurological- no focal deficit, oriented to self Skin- warm and dry   Labs reviewed CBC Latest Ref Rng 11/13/2015 10/01/2015 06/10/2015    WBC - 7.6 9.2 9.9  Hemoglobin 12.0 - 16.0 g/dL 11.8(A) 12.2 13.5  Hematocrit 36 - 46 % 36 38 41  Platelets 150 - 399 K/L 210 218 190   CMP Latest Ref Rng 11/13/2015 10/01/2015 04/04/2014  Glucose 70 - 99 mg/dL - - -  BUN 4 - 21 mg/dL Creatinine 0.5 - 1.1 mg/dL 0.6 0.7 0.6  Sodium 161 - 147 mmol/L 143 147 141  Potassium 3.4 - 5.3 mmol/L 3.9 4.6 3.9  Chloride 96 - 112 mEq/L - - -  CO2 19 - 32 mEq/L - - -  Calcium 8.4 - 10.5 mg/dL - - -  Total Protein 6.0 - 8.3 g/dL - - -  Total Bilirubin 0.3 - 1.2 mg/dL - - -  Alkaline Phos 25 - 125 U/L 42 43 -  AST 13 - 35 U/L 10(A) 10(A) -  ALT 7 - 35 U/L 7 8 -   Lab Results  Component Value Date   TSH 2.23 11/13/2015   Lab Results  Component Value Date   HGBA1C 5.5 10/01/2015   Lipid Panel     Component Value Date/Time   CHOL 204* 10/01/2015   TRIG 184* 10/01/2015   HDL 44 10/01/2015   LDLCALC 37 10/01/2015     Assessement/plan  Hypothyroidism Stable tsh. continue levothyroxine 50 mcg daily.   Depression with bipolar disorder Currently on prozac 40 mg daily. Change prozac to 60 mg daily. Will have her see psychiatry service. Continue psyhcology service as well. Continue depakote and lamictal  Chronic pain syndrome Stable with current regimen. Continue percocet 5-325 mg bid and robaxin 500 mg bid prn and monitor   Oneal Grout, MD  [facility] [phone] (Monday-Friday 8 am - 5 pm) [phone] (afterhours)

## 2015-12-15 ENCOUNTER — Other Ambulatory Visit: Payer: Self-pay | Admitting: *Deleted

## 2015-12-15 MED ORDER — OXYCODONE-ACETAMINOPHEN 5-325 MG PO TABS: ORAL_TABLET | ORAL | Status: DC

## 2015-12-15 NOTE — Telephone Encounter (Signed)
[facility] 

## 2015-12-17 ENCOUNTER — Non-Acute Institutional Stay (SKILLED_NURSING_FACILITY): Payer: Medicare Other | Admitting: Internal Medicine

## 2015-12-17 ENCOUNTER — Encounter: Payer: Self-pay | Admitting: Internal Medicine

## 2015-12-17 DIAGNOSIS — E538 Deficiency of other specified B group vitamins: Secondary | ICD-10-CM | POA: Diagnosis not present

## 2015-12-17 DIAGNOSIS — E785 Hyperlipidemia, unspecified: Secondary | ICD-10-CM | POA: Diagnosis not present

## 2015-12-17 DIAGNOSIS — E559 Vitamin D deficiency, unspecified: Secondary | ICD-10-CM

## 2015-12-17 DIAGNOSIS — F3160 Bipolar disorder, current episode mixed, unspecified: Secondary | ICD-10-CM | POA: Diagnosis not present

## 2015-12-17 NOTE — Progress Notes (Signed)
Patient ID: Pam Jackson, female   DOB: 22-Nov-1940, 75 y.o.   MRN: [id number]      [facility] - [facility]    Chief Complaint  Patient presents with  . Medical Management of Chronic Issues    Advanced Directives 12/17/2015  Does patient have an advance directive? No  Would patient like information on creating an advanced directive? No - patient declined information    Allergies: valtrex  Code status: Full Code  HPI 75 y/o female patient is seen for routine visit. She denies any concern. She has been mean towards her nursing staff lately. She has been found to be crying by staff. She refuses to talk to me or be examined today.    Review of Systems  Refuses to participate   Past Medical History  Diagnosis Date  . Bipolar 1 disorder (HCC)   . Depression   . GERD (gastroesophageal reflux disease)   . Anxiety   . Thyroid disease     hypothyroid  . Spinal stenosis   . Genital herpes   . Vitamin D deficiency      Medication reviewed. See [facility]   Medication List       This list is accurate as of: 12/17/15  4:27 PM.  Always use your most recent med list.               acetaminophen 650 MG CR tablet  Commonly known as:  TYLENOL  Take 650 mg by mouth 2 (two) times daily. Take 2 tablets = 1300 mg by mouth twice daily     aspirin 81 MG chewable tablet  Chew 81 mg by mouth daily.     cholecalciferol 1000 units tablet  Commonly known as:  VITAMIN D  Take 2,000 Units by mouth daily.     divalproex 500 MG DR tablet  Commonly known as:  DEPAKOTE  Take 1,000 mg by mouth at bedtime.     FLUoxetine 40 MG capsule  Commonly known as:  PROZAC  Take 40 mg by mouth daily.     lamoTRIgine 150 MG tablet  Commonly known as:  LAMICTAL  Take 150 mg by mouth daily.     levothyroxine 50 MCG tablet  Commonly known as:  SYNTHROID, LEVOTHROID  Take 50 mcg by mouth daily before breakfast.     methocarbamol 500 MG tablet  Commonly known as:  ROBAXIN    Take 500 mg by mouth 2 (two) times daily as needed for muscle spasms. Take one tablet at 2 pm if needed. Give 1 tablet by mouth at 9 am and 9 pm     oxyCODONE-acetaminophen 5-325 MG tablet  Commonly known as:  PERCOCET/ROXICET  Take one tablet by mouth every 12 hours for chronic pain. Do not exceed 4gm of Tylenol in 24 hours     simvastatin 20 MG tablet  Commonly known as:  ZOCOR  Take 20 mg by mouth at bedtime.     vitamin B-12 1000 MCG tablet  Commonly known as:  CYANOCOBALAMIN  Take 1,000 mcg by mouth daily.        Physical exam BP 124/66 mmHg  Pulse 76  Temp(Src) 98.2 F (36.8 C) (Oral)  Resp 20  Ht  (1.626 m)  Wt 199 lb 6.4 oz (90.447 kg)  BMI 34.21 kg/m2  SpO2 95%  Wt Readings from Last 3 Encounters:  12/17/15 199 lb 6.4 oz (90.447 kg)  11/25/15 203 lb 9.6 oz (92.352 kg)  10/22/15  198 lb (89.812 kg)   Refused exam  General- elderly female obese in no acute distress Head- atraumatic, normocephalic Eyes- no pallor, no icterus Musculoskeletal- able to move all 4 extremities   Labs reviewed CBC Latest Ref Rng 11/13/2015 10/01/2015 06/10/2015  WBC - 7.6 9.2 9.9  Hemoglobin 12.0 - 16.0 g/dL 11.8(A) 12.2 13.5  Hematocrit 36 - 46 % 36 38 41  Platelets 150 - 399 K/L 210 218 190   CMP Latest Ref Rng 11/13/2015 10/01/2015 04/04/2014  Glucose 70 - 99 mg/dL - - -  BUN 4 - 21 mg/dL 14 13 20   Creatinine 0.5 - 1.1 mg/dL 0.6 0.7 0.6  Sodium 540137 - 147 mmol/L 143 147 141  Potassium 3.4 - 5.3 mmol/L 3.9 4.6 3.9  Chloride 96 - 112 mEq/L - - -  CO2 19 - 32 mEq/L - - -  Calcium 8.4 - 10.5 mg/dL - - -  Total Protein 6.0 - 8.3 g/dL - - -  Total Bilirubin 0.3 - 1.2 mg/dL - - -  Alkaline Phos 25 - 125 U/L 42 43 -  AST 13 - 35 U/L 10(A) 10(A) -  ALT 7 - 35 U/L 7 8 -   Lab Results  Component Value Date   TSH 2.23 11/13/2015   Lab Results  Component Value Date   HGBA1C 5.5 10/01/2015   Lipid Panel     Component Value Date/Time   CHOL 204* 10/01/2015   TRIG 184*  10/01/2015   HDL 44 10/01/2015   LDLCALC 37 10/01/2015     Assessement/plan  Depression with bipolar disorder Currently on prozac 60 mg daily with depakote and lamictal. With her worsening mood, will increase prozac to 80 mg daily. Will need psychiatry services to evaluate further. Continue psychotherapy.  Vitamin d def Check vitamin d level. Continue vitamin d supplement  Hyperlipidemia Continue zocor 20 mg daily  b12 def Continue b12 supplement    Oneal GroutMAHIMA Daishaun Ayre, MD  [facility] [phone] (Monday-Friday 8 am - 5 pm) [phone] (afterhours)

## 2016-01-14 ENCOUNTER — Encounter: Payer: Self-pay | Admitting: Internal Medicine

## 2016-01-14 ENCOUNTER — Non-Acute Institutional Stay (SKILLED_NURSING_FACILITY): Payer: Medicare Other | Admitting: Internal Medicine

## 2016-01-14 DIAGNOSIS — L298 Other pruritus: Secondary | ICD-10-CM | POA: Diagnosis not present

## 2016-01-14 DIAGNOSIS — E038 Other specified hypothyroidism: Secondary | ICD-10-CM | POA: Diagnosis not present

## 2016-01-14 DIAGNOSIS — N898 Other specified noninflammatory disorders of vagina: Secondary | ICD-10-CM

## 2016-01-14 DIAGNOSIS — R300 Dysuria: Secondary | ICD-10-CM

## 2016-01-14 NOTE — Progress Notes (Signed)
Patient ID: Pam RothmanConstance C Theroux, female   DOB: 07/04/1940, 75 y.o.   MRN: [id number]      [facility]    Chief Complaint  Patient presents with  . Medical Management of Chronic Issues    Routine Visit    Advanced Directives 01/14/2016  Does patient have an advance directive? Yes  Type of Advance Directive Out of facility DNR (pink MOST or yellow form)  Does patient want to make changes to advanced directive? No - Patient declined  Copy of advanced directive(s) in chart? Yes  Would patient like information on creating an advanced directive? -    Allergies: valtrex  Code status: DNR  HPI 75 y/o female patient is seen for routine visit. She complaints of vaginal itching and burning with urination. She denies any other concern. No fall reported. No skin concern.   Review of Systems   Constitutional: Negative for fever, chills HENT: Negative for congestion, hearing loss and sore throat.   Eyes: Negative for blurred vision, double vision and discharge.  Respiratory: Negative for cough, shortness of breath and wheezing.   Cardiovascular: Negative for chest pain, palpitations, leg swelling.  Gastrointestinal: Negative for heartburn, nausea, vomiting, abdominal pain  Genitourinary: positive for dysuria.  Musculoskeletal: Negative for back pain, falls Skin: Negative for itching and rash.  Neurological: Negative for dizziness Psychiatric/Behavioral: positive for depression    Past Medical History  Diagnosis Date  . Bipolar 1 disorder (HCC)   . Depression   . GERD (gastroesophageal reflux disease)   . Anxiety   . Thyroid disease     hypothyroid  . Spinal stenosis   . Genital herpes   . Vitamin D deficiency      Medication [facility]. [facility]   Medication List       This list is accurate as of: 01/14/16  3:17 PM.  Always use your most recent med list.               acetaminophen 650 MG CR tablet  Commonly known as:  TYLENOL  Take 650  mg by mouth 2 (two) times daily. Take 2 tablets = 1300 mg by mouth twice daily     aspirin 81 MG chewable tablet  Chew 81 mg by mouth daily.     cholecalciferol 1000 units tablet  Commonly known as:  VITAMIN D  Take 2,000 Units by mouth daily.     divalproex 500 MG DR tablet  Commonly known as:  DEPAKOTE  Take 1,000 mg by mouth at bedtime.     FLUoxetine 40 MG capsule  Commonly known as:  PROZAC  Take 40 mg by mouth daily.     lamoTRIgine 200 MG tablet  Commonly known as:  LAMICTAL  Take 200 mg by mouth daily.     levothyroxine 50 MCG tablet  Commonly known as:  SYNTHROID, LEVOTHROID  Take 50 mcg by mouth daily before breakfast.     methocarbamol 500 MG tablet  Commonly known as:  ROBAXIN  Take 500 mg by mouth 2 (two) times daily as needed for muscle spasms. Take one tablet at 2 pm if needed. Give 1 tablet by mouth at 9 am and 9 pm     nystatin cream  Commonly known as:  MYCOSTATIN  Apply 1 application topically 2 (two) times daily. Apply to red areas on vaginal and buttocks til resolved     oxyCODONE-acetaminophen 5-325 MG tablet  Commonly known as:  PERCOCET/ROXICET  Take one  tablet by mouth every 12 hours for chronic pain. Do not exceed 4gm of Tylenol in 24 hours     simvastatin 20 MG tablet  Commonly known as:  ZOCOR  Take 20 mg by mouth at bedtime.     vitamin B-12 1000 MCG tablet  Commonly known as:  CYANOCOBALAMIN  Take 1,000 mcg by mouth daily.        Physical exam BP 79/54 mmHg  Pulse 81  Temp(Src) 98 F (36.7 C) (Oral)  Resp 17  Ht  (1.626 m)  Wt 203 lb 9.6 oz (92.352 kg)  BMI 34.93 kg/m2  Wt Readings from Last 3 Encounters:  01/14/16 203 lb 9.6 oz (92.352 kg)  12/17/15 199 lb 6.4 oz (90.447 kg)  11/25/15 203 lb 9.6 oz (92.352 kg)   General- elderly female in no acute distress Head- atraumatic, normocephalic Eyes- no pallor, no icterus, no discharge Neck- no lymphadenopathy Nose- no nasal discharge Mouth- normal mucus membrane, no  oral thrush Cardiovascular- normal s1,s2, no murmur Respiratory- bilateral clear to auscultation, no wheeze, no rhonchi, no crackles Abdomen- bowel sounds present, soft, non tender Musculoskeletal- able to move all 4 extremities, generalized weakness Neurological- alert and oriented to person Skin- warm and dry Psychiatry- flat affect    Labs reviewed CBC Latest Ref Rng 11/13/2015 10/01/2015 06/10/2015  WBC - 7.6 9.2 9.9  Hemoglobin 12.0 - 16.0 g/dL 11.8(A) 12.2 13.5  Hematocrit 36 - 46 % 36 38 41  Platelets 150 - 399 K/L 210 218 190   CMP Latest Ref Rng 11/13/2015 10/01/2015 04/04/2014  Glucose 70 - 99 mg/dL - - -  BUN 4 - 21 mg/dL Creatinine 0.5 - 1.1 mg/dL 0.6 0.7 0.6  Sodium 161 - 147 mmol/L 143 147 141  Potassium 3.4 - 5.3 mmol/L 3.9 4.6 3.9  Chloride 96 - 112 mEq/L - - -  CO2 19 - 32 mEq/L - - -  Calcium 8.4 - 10.5 mg/dL - - -  Total Protein 6.0 - 8.3 g/dL - - -  Total Bilirubin 0.3 - 1.2 mg/dL - - -  Alkaline Phos 25 - 125 U/L 42 43 -  AST 13 - 35 U/L 10(A) 10(A) -  ALT 7 - 35 U/L 7 8 -   Lab Results  Component Value Date   TSH 2.23 11/13/2015   Lab Results  Component Value Date   HGBA1C 5.5 10/01/2015   Lipid Panel     Component Value Date/Time   CHOL 204* 10/01/2015   TRIG 184* 10/01/2015   HDL 44 10/01/2015   LDLCALC 37 10/01/2015     Assessement/plan  Dysuria Send u/a with c/s. Continue perineal care  Vaginal itching Currently on nystatin cream. Give fluconazole 150 mg po x 1. Pending gyn referral for further assessment with concern for atrophic vaginitis  Hypothyroidism Lab Results  Component Value Date   TSH 2.23 11/13/2015   Continue synthroid 50 mcg daily and monitor     Oneal Grout, MD  [facility] [phone] (Monday-Friday 8 am - 5 pm) [phone] (afterhours)

## 2016-02-24 ENCOUNTER — Encounter: Payer: Self-pay | Admitting: Internal Medicine

## 2016-02-24 ENCOUNTER — Non-Acute Institutional Stay (SKILLED_NURSING_FACILITY): Payer: Medicare Other | Admitting: Internal Medicine

## 2016-02-24 DIAGNOSIS — G894 Chronic pain syndrome: Secondary | ICD-10-CM

## 2016-02-24 DIAGNOSIS — E038 Other specified hypothyroidism: Secondary | ICD-10-CM

## 2016-02-24 DIAGNOSIS — F3130 Bipolar disorder, current episode depressed, mild or moderate severity, unspecified: Secondary | ICD-10-CM

## 2016-02-24 DIAGNOSIS — F319 Bipolar disorder, unspecified: Secondary | ICD-10-CM

## 2016-02-24 NOTE — Progress Notes (Signed)
Patient ID: Pam Jackson, female   DOB: 03/12/1941, 75 y.o.   MRN: [id number]      [facility]    Chief Complaint  Patient presents with  . Medical Management of Chronic Issues    Routine Visit    Advanced Directives 01/14/2016  Does patient have an advance directive? Yes  Type of Advance Directive Out of facility DNR (pink MOST or yellow form)  Does patient want to make changes to advanced directive? No - Patient declined  Copy of advanced directive(s) in chart? Yes  Would patient like information on creating an advanced directive? -  Pre-existing out of facility DNR order (yellow form or pink MOST form) -    Allergies: valtrex  Code status: DNR  HPI 75 y/o female patient is seen for routine visit. She denies any concern.   Review of Systems   Constitutional: Negative for fever, chills HENT: Negative for congestion, hearing loss and sore throat.   Eyes: Negative for blurred vision, double vision and discharge.  Respiratory: Negative for cough, shortness of breath and wheezing.   Cardiovascular: Negative for chest pain, palpitations, leg swelling.  Gastrointestinal: Negative for heartburn, nausea, vomiting, abdominal pain  Genitourinary: positive for dysuria.  Musculoskeletal: Negative for back pain, fall Skin: Negative for itching and rash.  Neurological: Negative for dizziness Psychiatric/Behavioral: positive for depression    Past Medical History:  Diagnosis Date  . Anxiety   . Bipolar 1 disorder (HCC)   . Depression   . Genital herpes   . GERD (gastroesophageal reflux disease)   . Spinal stenosis   . Thyroid disease    hypothyroid  . Vitamin D deficiency      [facility]   Medication List       Accurate as of 02/24/16  3:07 PM. Always use your most recent med list.          acetaminophen 650 MG CR tablet Commonly known as:  TYLENOL Take 650 mg by mouth 2 (two) times daily. Take 2 tablets =  1300 mg by mouth twice daily   aspirin 81 MG chewable tablet Chew 81 mg by mouth daily.   cholecalciferol 1000 units tablet Commonly known as:  VITAMIN D Take 2,000 Units by mouth daily.   divalproex 500 MG DR tablet Commonly known as:  DEPAKOTE Take 1,000 mg by mouth at bedtime.   FLUoxetine 40 MG capsule Commonly known as:  PROZAC Take 80 mg by mouth daily.   hydrocortisone cream 1 % Apply 1 application topically 3 (three) times daily as needed for itching.   lamoTRIgine 200 MG tablet Commonly known as:  LAMICTAL Take 200 mg by mouth daily.   levothyroxine 50 MCG tablet Commonly known as:  SYNTHROID, LEVOTHROID Take 50 mcg by mouth daily before breakfast.   methocarbamol 500 MG tablet Commonly known as:  ROBAXIN Take 500 mg by mouth daily as needed for muscle spasms (Give one tablet by mouth at 9am and 9pm).   nystatin cream Commonly known as:  MYCOSTATIN Apply 1 application topically 2 (two) times daily. Apply to red areas on vaginal and buttocks til resolved   oxyCODONE-acetaminophen 5-325 MG tablet Commonly known as:  PERCOCET/ROXICET Take 1 tablet by mouth daily as needed for severe pain.   oxyCODONE-acetaminophen 5-325 MG tablet Commonly known as:  PERCOCET/ROXICET Take one tablet by mouth every 12 hours for chronic pain. Do not exceed 4gm of Tylenol in 24 hours   simvastatin 20  MG tablet Commonly known as:  ZOCOR Take 20 mg by mouth at bedtime.   vitamin B-12 1000 MCG tablet Commonly known as:  CYANOCOBALAMIN Take 1,000 mcg by mouth daily.       Physical exam BP 121/66   Pulse 83   Temp 98.2 F (36.8 C) (Oral)   Resp (!) 21   Ht 5\' 4"  (1.626 m)   Wt 189 lb 14.4 oz (86.1 kg)   SpO2 98%   BMI 32.60 kg/m   Wt Readings from Last 3 Encounters:  02/24/16 189 lb 14.4 oz (86.1 kg)  01/14/16 203 lb 9.6 oz (92.4 kg)  12/17/15 199 lb 6.4 oz (90.4 kg)   General- elderly obese female in no acute distress Head- atraumatic, normocephalic Eyes- no  pallor, no icterus, no discharge Neck- no lymphadenopathy Nose- no nasal discharge Mouth- normal mucus membrane Cardiovascular- normal s1,s2, no murmur Respiratory- bilateral clear to auscultation Abdomen- bowel sounds present, soft, non tender Musculoskeletal- able to move all 4 extremities, generalized weakness Neurological- alert and oriented to person and place Skin- warm and dry Psychiatry- flat affect    Labs reviewed CBC Latest Ref Rng & Units 11/13/2015 10/01/2015 06/10/2015  WBC 10:3/mL 7.6 9.2 9.9  Hemoglobin 12.0 - 16.0 g/dL 11.8(A) 12.2 13.5  Hematocrit 36 - 46 % 36 38 41  Platelets 150 - 399 K/L 210 218 190   CMP Latest Ref Rng & Units 11/13/2015 10/01/2015 04/04/2014  Glucose 70 - 99 mg/dL - - -  BUN 4 - 21 mg/dL 14 13 20   Creatinine 0.5 - 1.1 mg/dL 0.6 0.7 0.6  Sodium 161137 - 147 mmol/L 143 147 141  Potassium 3.4 - 5.3 mmol/L 3.9 4.6 3.9  Chloride 96 - 112 mEq/L - - -  CO2 19 - 32 mEq/L - - -  Calcium 8.4 - 10.5 mg/dL - - -  Total Protein 6.0 - 8.3 g/dL - - -  Total Bilirubin 0.3 - 1.2 mg/dL - - -  Alkaline Phos 25 - 125 U/L 42 43 -  AST 13 - 35 U/L 10(A) 10(A) -  ALT 7 - 35 U/L 7 8 -   Lab Results  Component Value Date   TSH 2.23 11/13/2015   Lab Results  Component Value Date   HGBA1C 5.5 10/01/2015   Lipid Panel     Component Value Date/Time   CHOL 204 (A) 10/01/2015   TRIG 184 (A) 10/01/2015   HDL 44 10/01/2015   LDLCALC 37 10/01/2015     Assessement/plan  Chronic pain syndrome Stable with current regimen. Continue percocet 5-325 mg bid with tylenol 650 mg bid and robaxin 500 mg daily prn and monitor  Hypothyroidism Stable tsh. continue levothyroxine 50 mcg daily.  Lab Results  Component Value Date   TSH 2.23 11/13/2015     Depression with bipolar disorder Currently on prozac 80 mg daily with depakote and lamictal. monitor     Dysuria Send u/a with c/s. Continue perineal care  Vaginal itching Currently on nystatin cream. Give  fluconazole 150 mg po x 1. Pending gyn referral for further assessment with concern for atrophic vaginitis  Hypothyroidism Lab Results  Component Value Date   TSH 2.23 11/13/2015   Continue synthroid 50 mcg daily and monitor     Oneal GroutMAHIMA Carmine Youngberg, MD  [facility] [phone] (Monday-Friday 8 am - 5 pm) [phone] (afterhours)

## 2016-02-26 ENCOUNTER — Other Ambulatory Visit: Payer: Self-pay | Admitting: *Deleted

## 2016-02-26 LAB — LIPID PANEL
CHOLESTEROL: 125 mg/dL (ref 0–200)
HDL: 52 mg/dL (ref 35–70)
LDL CALC: 47 mg/dL
TRIGLYCERIDES: 134 mg/dL (ref 40–160)

## 2016-02-26 MED ORDER — OXYCODONE-ACETAMINOPHEN 5-325 MG PO TABS: ORAL_TABLET | ORAL | 0 refills | Status: AC

## 2016-02-26 NOTE — Telephone Encounter (Signed)
[facility] [phone] Fax: [phone]  

## 2016-03-28 ENCOUNTER — Encounter: Payer: Self-pay | Admitting: Internal Medicine

## 2016-03-28 ENCOUNTER — Non-Acute Institutional Stay (SKILLED_NURSING_FACILITY): Payer: Medicare Other | Admitting: Internal Medicine

## 2016-03-28 DIAGNOSIS — K219 Gastro-esophageal reflux disease without esophagitis: Secondary | ICD-10-CM

## 2016-03-28 DIAGNOSIS — F319 Bipolar disorder, unspecified: Secondary | ICD-10-CM

## 2016-03-28 DIAGNOSIS — F3130 Bipolar disorder, current episode depressed, mild or moderate severity, unspecified: Secondary | ICD-10-CM

## 2016-03-28 DIAGNOSIS — Z0000 Encounter for general adult medical examination without abnormal findings: Secondary | ICD-10-CM

## 2016-03-28 DIAGNOSIS — Z1239 Encounter for other screening for malignant neoplasm of breast: Secondary | ICD-10-CM | POA: Diagnosis not present

## 2016-03-28 DIAGNOSIS — F0390 Unspecified dementia without behavioral disturbance: Secondary | ICD-10-CM | POA: Diagnosis not present

## 2016-03-28 DIAGNOSIS — E785 Hyperlipidemia, unspecified: Secondary | ICD-10-CM

## 2016-03-28 DIAGNOSIS — Z1211 Encounter for screening for malignant neoplasm of colon: Secondary | ICD-10-CM | POA: Diagnosis not present

## 2016-03-28 DIAGNOSIS — E559 Vitamin D deficiency, unspecified: Secondary | ICD-10-CM

## 2016-03-28 DIAGNOSIS — E038 Other specified hypothyroidism: Secondary | ICD-10-CM | POA: Diagnosis not present

## 2016-03-28 DIAGNOSIS — G894 Chronic pain syndrome: Secondary | ICD-10-CM | POA: Diagnosis not present

## 2016-03-28 NOTE — Progress Notes (Signed)
Patient ID: Pam Jackson, female   DOB: [birth year], 75 y.o.   MRN: [id number]      [facility]   Chief Complaint  Patient presents with  . Annual Exam     Advanced Directives 01/14/2016  Does patient have an advance directive? Yes  Type of Advance Directive Out of facility DNR (pink MOST or yellow form)  Does patient want to make changes to advanced directive? No - Patient declined  Copy of advanced directive(s) in chart? Yes  Would patient like information on creating an advanced directive? -  Pre-existing out of facility DNR order (yellow form or pink MOST form) -    Allergies: valtrex  Code status: DNR    HPI 75 y/o female patient is seen for annual visit. She denies any concern. No concern from nursing. No fall reported. She is followed by psych services for her depression. She refuses to get out of bed.    Review of Systems   Constitutional: Negative for fever , chills Respiratory: Negative for cough, shortness of breath Cardiovascular: Negative for chest pain , palpitation, leg edema Gastrointestinal: Negative for heartburn, nausea, vomiting, abdominal pain, melena.   Genitourinary: Negative for dysuria and flank pain and hematuria.  Musculoskeletal: Negative for back pain, fall Skin: Negative for itching and rash.  Neurological: Negative for dizziness     Past Medical History:  Diagnosis Date  . Anxiety   . Bipolar 1 disorder (HCC)   . Depression   . Genital herpes   . GERD (gastroesophageal reflux disease)   . Spinal stenosis   . Thyroid disease    hypothyroid  . Vitamin D deficiency      Medication reviewed. See [facility]   Medication List       Accurate as of 03/28/16 11:56 AM. Always use your most recent med list.          acetaminophen 650 MG CR tablet Commonly known as:  TYLENOL Take 650 mg by mouth 2 (two) times daily. Take 2 tablets = 1300 mg by mouth twice daily   aspirin 81 MG chewable tablet Chew  81 mg by mouth daily.   cholecalciferol 1000 units tablet Commonly known as:  VITAMIN D Take 2,000 Units by mouth daily.   divalproex 500 MG DR tablet Commonly known as:  DEPAKOTE Take 1,000 mg by mouth at bedtime.   FLUoxetine 40 MG capsule Commonly known as:  PROZAC Take 80 mg by mouth daily.   hydrocortisone cream 1 % Apply 1 application topically 3 (three) times daily as needed for itching.   lamoTRIgine 200 MG tablet Commonly known as:  LAMICTAL Take 200 mg by mouth daily.   levothyroxine 50 MCG tablet Commonly known as:  SYNTHROID, LEVOTHROID Take 50 mcg by mouth daily before breakfast.   methocarbamol 500 MG tablet Commonly known as:  ROBAXIN Take 500 mg by mouth daily as needed for muscle spasms (Give one tablet by mouth at 9am and 9pm).   nystatin cream Commonly known as:  MYCOSTATIN Apply 1 application topically 2 (two) times daily. Apply to red areas on vaginal and buttocks til resolved   oxyCODONE-acetaminophen 5-325 MG tablet Commonly known as:  PERCOCET/ROXICET Take 1 tablet by mouth daily as needed for severe pain.   oxyCODONE-acetaminophen 5-325 MG tablet Commonly known as:  PERCOCET/ROXICET Take one tablet by mouth every 12 hours for chronic pain. Do not exceed 4gm of Tylenol in 24 hours   simvastatin 20 MG tablet Commonly  known as:  ZOCOR Take 20 mg by mouth at bedtime.   vitamin B-12 1000 MCG tablet Commonly known as:  CYANOCOBALAMIN Take 1,000 mcg by mouth daily.       Physical exam BP 112/62   Temp 97.4 F (36.3 C) (Oral)   Resp 20   Ht 5\' 4"  (1.626 m)   Wt 187 lb 14.4 oz (85.2 kg)   SpO2 92%   BMI 32.25 kg/m   Wt Readings from Last 3 Encounters:  03/28/16 187 lb 14.4 oz (85.2 kg)  02/24/16 189 lb 14.4 oz (86.1 kg)  01/14/16 203 lb 9.6 oz (92.4 kg)   General- elderly obese female in no acute distress Head- atraumatic, normocephalic Eyes- no pallor, no icterus, no discharge Neck- no lymphadenopathy Nose- no nasal  discharge Mouth- normal mucus membrane Cardiovascular- normal s1,s2, no murmur, no leg edema Respiratory- bilateral clear to auscultation Abdomen- bowel sounds present, soft, non tender Musculoskeletal- able to move all 4 extremities, generalized weakness Neurological- alert and oriented to person and place but not to time Skin- warm and dry Psychiatry- flat affect    Labs reviewed CBC Latest Ref Rng & Units 11/13/2015 10/01/2015 06/10/2015  WBC 10:3/mL 7.6 9.2 9.9  Hemoglobin 12.0 - 16.0 g/dL 11.8(A) 12.2 13.5  Hematocrit 36 - 46 % 36 38 41  Platelets 150 - 399 K/L 210 218 190   CMP Latest Ref Rng & Units 11/13/2015 10/01/2015 04/04/2014  Glucose 70 - 99 mg/dL - - -  BUN 4 - 21 mg/dL 14 13 20   Creatinine 0.5 - 1.1 mg/dL 0.6 0.7 0.6  Sodium 347137 - 147 mmol/L 143 147 141  Potassium 3.4 - 5.3 mmol/L 3.9 4.6 3.9  Chloride 96 - 112 mEq/L - - -  CO2 19 - 32 mEq/L - - -  Calcium 8.4 - 10.5 mg/dL - - -  Total Protein 6.0 - 8.3 g/dL - - -  Total Bilirubin 0.3 - 1.2 mg/dL - - -  Alkaline Phos 25 - 125 U/L 42 43 -  AST 13 - 35 U/L 10(A) 10(A) -  ALT 7 - 35 U/L 7 8 -   Lab Results  Component Value Date   TSH 2.23 11/13/2015   Lab Results  Component Value Date   HGBA1C 5.5 10/01/2015   Lipid Panel     Component Value Date/Time   CHOL 125 02/26/2016   TRIG 134 02/26/2016   HDL 52 02/26/2016   LDLCALC 47 02/26/2016    Immunization History  Administered Date(s) Administered  . Influenza-Unspecified 04/11/2014, 04/15/2015  . PPD Test 11/12/2013, 04/03/2014, 03/20/2015  . Pneumococcal-Unspecified 07/05/2011    Assessement/plan   Annual exam Due for prevnar vaccine, wrriten for it to be provided. Will need mammogram and FOBT X 3 as below  Breast cancer screen Get mammogram scheduled to evaluate further  Colon cancer screen FOBT X 3  Hyperlipidemia Reviewed lipide panel. Continue zocor  Vitamin d def Continue vitamin d supplement  gerd Stable, off all meds at  present. monitor  Chronic pain syndrome Stable with current regimen. Continue percocet 5-325 mg bid with tylenol 650 mg bid and robaxin 500 mg daily prn and monitor  Dementia  Has depression with this. Continue supportive care for now. Start aricept 10 mg daily at bedtime and monitor  Hypothyroidism Stable tsh. continue levothyroxine 50 mcg daily.  Lab Results  Component Value Date   TSH 2.23 11/13/2015    Depression with bipolar disorder Currently on prozac 80 mg daily with depakote  and lamictal. monitor   Oneal Grout, MD Internal Medicine [facility] [address] [place], [place] [address] Cell Phone (Monday-Friday 8 am - 5 pm): [phone] On Call: [phone] and follow prompts after 5 pm and on weekends Office Phone: [phone] Office Fax: [phone]

## 2016-03-31 ENCOUNTER — Encounter: Payer: Self-pay | Admitting: Internal Medicine

## 2016-03-31 DIAGNOSIS — E785 Hyperlipidemia, unspecified: Secondary | ICD-10-CM | POA: Insufficient documentation

## 2016-03-31 HISTORY — DX: Hyperlipidemia, unspecified: E78.5

## 2016-04-07 ENCOUNTER — Other Ambulatory Visit: Payer: Self-pay | Admitting: Internal Medicine

## 2016-04-07 DIAGNOSIS — Z1231 Encounter for screening mammogram for malignant neoplasm of breast: Secondary | ICD-10-CM

## 2016-04-18 ENCOUNTER — Non-Acute Institutional Stay (SKILLED_NURSING_FACILITY): Payer: Medicare Other | Admitting: Internal Medicine

## 2016-04-18 ENCOUNTER — Encounter: Payer: Self-pay | Admitting: Internal Medicine

## 2016-04-18 DIAGNOSIS — G308 Other Alzheimer's disease: Secondary | ICD-10-CM

## 2016-04-18 DIAGNOSIS — E7849 Other hyperlipidemia: Secondary | ICD-10-CM

## 2016-04-18 DIAGNOSIS — F3177 Bipolar disorder, in partial remission, most recent episode mixed: Secondary | ICD-10-CM

## 2016-04-18 DIAGNOSIS — E784 Other hyperlipidemia: Secondary | ICD-10-CM

## 2016-04-18 DIAGNOSIS — F339 Major depressive disorder, recurrent, unspecified: Secondary | ICD-10-CM

## 2016-04-18 DIAGNOSIS — F0281 Dementia in other diseases classified elsewhere with behavioral disturbance: Secondary | ICD-10-CM | POA: Diagnosis not present

## 2016-04-18 NOTE — Progress Notes (Signed)
Patient ID: Pam RothmanConstance C Wolfgang, female   DOB: 11/01/1940, 75 y.o.   MRN: [id number]      [facility]    Chief Complaint  Patient presents with  . Medical Management of Chronic Issues    Routine Visit    Advanced Directives 01/14/2016  Does patient have an advance directive? Yes  Type of Advance Directive Out of facility DNR (pink MOST or yellow form)  Does patient want to make changes to advanced directive? No - Patient declined  Copy of advanced directive(s) in chart? Yes  Would patient like information on creating an advanced directive? -  Pre-existing out of facility DNR order (yellow form or pink MOST form) -    Allergies: valtrex  Code status: DNR  HPI 75 y/o female patient is seen for routine visit. She denies any concern. No new concern from nursing staff.  Review of Systems   Constitutional: Negative for fever HENT: Negative for congestion and sore throat.   Eyes: Negative for blurred vision.  Respiratory: Negative for cough, shortness of breath and wheezing.   Cardiovascular: Negative for chest pain, palpitations.  Gastrointestinal: Negative for heartburn, nausea, vomiting, abdominal pain  Genitourinary: Negative for dysuria.  Musculoskeletal: Negative for back pain, fall Neurological: Negative for dizziness Psychiatric/Behavioral: Positive for depression    Past Medical History:  Diagnosis Date  . Anxiety   . Bipolar 1 disorder (HCC)   . Depression   . Genital herpes   . GERD (gastroesophageal reflux disease)   . Hyperlipidemia 03/31/2016  . Spinal stenosis   . Thyroid disease    hypothyroid  . Vitamin D deficiency      [facility]   Medication List       Accurate as of 04/18/16  3:01 PM. Always use your most recent med list.          acetaminophen 650 MG CR tablet Commonly known as:  TYLENOL Take 650 mg by mouth 2 (two) times daily. Take 2 tablets = 1300 mg by mouth twice daily   aspirin  81 MG chewable tablet Chew 81 mg by mouth daily.   cholecalciferol 1000 units tablet Commonly known as:  VITAMIN D Take 2,000 Units by mouth daily.   divalproex 500 MG DR tablet Commonly known as:  DEPAKOTE Take 1,000 mg by mouth at bedtime.   donepezil 10 MG tablet Commonly known as:  ARICEPT Take 10 mg by mouth at bedtime.   FLUoxetine 40 MG capsule Commonly known as:  PROZAC Take 80 mg by mouth daily.   hydrocortisone cream 1 % Apply 1 application topically 3 (three) times daily as needed for itching.   lamoTRIgine 200 MG tablet Commonly known as:  LAMICTAL Take 200 mg by mouth daily.   levothyroxine 50 MCG tablet Commonly known as:  SYNTHROID, LEVOTHROID Take 50 mcg by mouth daily before breakfast.   methocarbamol 500 MG tablet Commonly known as:  ROBAXIN Take 500 mg by mouth daily as needed for muscle spasms (Give one tablet by mouth at 9am and 9pm).   nystatin cream Commonly known as:  MYCOSTATIN Apply 1 application topically 2 (two) times daily. Apply to red areas on vaginal and buttocks til resolved   oxyCODONE-acetaminophen 5-325 MG tablet Commonly known as:  PERCOCET/ROXICET Take 1 tablet by mouth daily as needed for severe pain.   oxyCODONE-acetaminophen 5-325 MG tablet Commonly known as:  PERCOCET/ROXICET Take one tablet by mouth every 12 hours for chronic pain. Do not  exceed 4gm of Tylenol in 24 hours   simvastatin 20 MG tablet Commonly known as:  ZOCOR Take 20 mg by mouth at bedtime.   vitamin B-12 1000 MCG tablet Commonly known as:  CYANOCOBALAMIN Take 1,000 mcg by mouth daily.       Physical exam BP 113/69   Pulse 71   Temp 98.8 F (37.1 C) (Oral)   Resp 20   Ht 5\' 4"  (1.626 m)   Wt 186 lb 8 oz (84.6 kg)   SpO2 98%   BMI 32.01 kg/m   Wt Readings from Last 3 Encounters:  04/18/16 186 lb 8 oz (84.6 kg)  03/28/16 187 lb 14.4 oz (85.2 kg)  02/24/16 189 lb 14.4 oz (86.1 kg)   General- elderly obese female in no acute distress Head-  atraumatic, normocephalic Eyes- no pallor, no icterus, no discharge Neck- no lymphadenopathy Nose- no nasal discharge Mouth- normal mucus membrane Cardiovascular- normal s1,s2, no murmur Respiratory- bilateral clear to auscultation Abdomen- bowel sounds present, soft, non tender Musculoskeletal- able to move all 4 extremities, generalized weakness Neurological- alert and oriented to person and place Skin- warm and dry Psychiatry- appears grumpy    Labs reviewed CBC Latest Ref Rng & Units 11/13/2015 10/01/2015 06/10/2015  WBC 10:3/mL 7.6 9.2 9.9  Hemoglobin 12.0 - 16.0 g/dL 11.8(A) 12.2 13.5  Hematocrit 36 - 46 % 36 38 41  Platelets 150 - 399 K/L 210 218 190   CMP Latest Ref Rng & Units 11/13/2015 10/01/2015 04/04/2014  Glucose 70 - 99 mg/dL - - -  BUN 4 - 21 mg/dL 14 13 20   Creatinine 0.5 - 1.1 mg/dL 0.6 0.7 0.6  Sodium 161 - 147 mmol/L 143 147 141  Potassium 3.4 - 5.3 mmol/L 3.9 4.6 3.9  Chloride 96 - 112 mEq/L - - -  CO2 19 - 32 mEq/L - - -  Calcium 8.4 - 10.5 mg/dL - - -  Total Protein 6.0 - 8.3 g/dL - - -  Total Bilirubin 0.3 - 1.2 mg/dL - - -  Alkaline Phos 25 - 125 U/L 42 43 -  AST 13 - 35 U/L 10(A) 10(A) -  ALT 7 - 35 U/L 7 8 -   Lab Results  Component Value Date   TSH 2.23 11/13/2015   Lab Results  Component Value Date   HGBA1C 5.5 10/01/2015   Lipid Panel     Component Value Date/Time   CHOL 125 02/26/2016   TRIG 134 02/26/2016   HDL 52 02/26/2016   LDLCALC 47 02/26/2016     Assessement/plan  Major chronic depression Ongoing, refuses being depressed today. Is in a bad mood and rude during this visit. Continue prozac 80 mg daily. To be followed by pscyh services.   Bipolar disorder Continue lamictal 200 mg daily, depakote and prozac  Dementia with behavioral disturbance Started on aricept, monitor clinically, continue supportive care  Hyperlipidemia LDL at goal. Continue zocor 20 mg daily   Oneal Grout, MD Internal Medicine [facility] [address] [place], [place] [address] Cell Phone (Monday-Friday 8 am - 5 pm): [phone] On Call: [phone] and follow prompts after 5 pm and on weekends Office Phone: [phone] Office Fax: [phone]

## 2016-04-19 DIAGNOSIS — F339 Major depressive disorder, recurrent, unspecified: Secondary | ICD-10-CM | POA: Insufficient documentation

## 2016-04-27 ENCOUNTER — Ambulatory Visit
Admission: RE | Admit: 2016-04-27 | Discharge: 2016-04-27 | Disposition: A | Discharge status: Home / Self Care | Payer: Medicare Other | Source: Ambulatory Visit | Attending: Internal Medicine | Admitting: Internal Medicine

## 2016-04-27 DIAGNOSIS — Z1231 Encounter for screening mammogram for malignant neoplasm of breast: Secondary | ICD-10-CM

## 2016-04-28 ENCOUNTER — Other Ambulatory Visit: Payer: Self-pay | Admitting: Internal Medicine

## 2016-04-28 DIAGNOSIS — R928 Other abnormal and inconclusive findings on diagnostic imaging of breast: Secondary | ICD-10-CM

## 2016-05-11 LAB — BASIC METABOLIC PANEL
BUN: 8 mg/dL (ref 4–21)
Creatinine: 0.6 mg/dL (ref 0.5–1.1)
Glucose: 92 mg/dL
Potassium: 3.2 mmol/L — AB (ref 3.4–5.3)
Sodium: 145 mmol/L (ref 137–147)

## 2016-05-11 LAB — HEPATIC FUNCTION PANEL
ALK PHOS: 41 U/L (ref 25–125)
ALT: 6 U/L — AB (ref 7–35)
AST: 10 U/L — AB (ref 13–35)
BILIRUBIN, TOTAL: 0.3 mg/dL

## 2016-05-11 LAB — CBC AND DIFFERENTIAL
HEMATOCRIT: 34 % — AB (ref 36–46)
HEMOGLOBIN: 11.2 g/dL — AB (ref 12.0–16.0)
PLATELETS: 191 10*3/uL (ref 150–399)
WBC: 6.4 10*3/mL

## 2016-06-09 ENCOUNTER — Encounter: Payer: Self-pay | Admitting: Internal Medicine

## 2016-06-09 ENCOUNTER — Non-Acute Institutional Stay (SKILLED_NURSING_FACILITY): Payer: Medicare Other | Admitting: Internal Medicine

## 2016-06-09 DIAGNOSIS — R634 Abnormal weight loss: Secondary | ICD-10-CM

## 2016-06-09 DIAGNOSIS — E038 Other specified hypothyroidism: Secondary | ICD-10-CM

## 2016-06-09 DIAGNOSIS — F339 Major depressive disorder, recurrent, unspecified: Secondary | ICD-10-CM | POA: Diagnosis not present

## 2016-06-09 NOTE — Progress Notes (Signed)
Patient ID: Pam Jackson, female   DOB: 05/29/1941, 75 y.o.   MRN: [id number]      [facility]    Chief Complaint  Patient presents with  . Medical Management of Chronic Issues    Routine Visit    Advanced Directives 01/14/2016  Does Patient Have a Medical Advance Directive? Yes  Type of Advance Directive Out of facility DNR (pink MOST or yellow form)  Does patient want to make changes to medical advance directive? No - Patient declined  Copy of Healthcare Power of Attorney in Chart? Yes  Would patient like information on creating a medical advance directive? -  Pre-existing out of facility DNR order (yellow form or pink MOST form) -    Allergies: valtrex  Code status: DNR  HPI 75 y/o female patient is seen for routine visit. She denies any concern. No new concern from nursing staff. Per dietary, she continues to lose weight and has lost 7.5% in 1 month. She eats 25-50% of her meals.   Review of Systems   Constitutional: Negative for fever HENT: Negative for congestion, dysphagia and sore throat.   Eyes: Negative for blurred vision.  Respiratory: Negative for cough, shortness of breath  Cardiovascular: Negative for chest pain, palpitations.  Gastrointestinal: Negative for heartburn, nausea, vomiting, abdominal pain  Genitourinary: Negative for dysuria.  Musculoskeletal: Negative for back pain, fall Neurological: Negative for dizziness Psychiatric/Behavioral: Positive for depression    Past Medical History:  Diagnosis Date  . Anxiety   . Bipolar 1 disorder (HCC)   . Depression   . Genital herpes   . GERD (gastroesophageal reflux disease)   . Hyperlipidemia 03/31/2016  . Spinal stenosis   . Thyroid disease    hypothyroid  . Vitamin D deficiency      Medication reviewed. See [facility]   Medication List       Accurate as of 06/09/16  3:58 PM. Always use your most recent med list.          acetaminophen 650 MG CR  tablet Commonly known as:  TYLENOL Take 1,300 mg by mouth 2 (two) times daily.   aspirin 81 MG chewable tablet Chew 81 mg by mouth daily.   cholecalciferol 1000 units tablet Commonly known as:  VITAMIN D Take 2,000 Units by mouth daily.   divalproex 500 MG DR tablet Commonly known as:  DEPAKOTE Take 1,000 mg by mouth at bedtime.   FLUoxetine 40 MG capsule Commonly known as:  PROZAC Take 80 mg by mouth daily.   hydrocortisone cream 1 % Apply 1 application topically 3 (three) times daily as needed for itching.   lamoTRIgine 200 MG tablet Commonly known as:  LAMICTAL Take 200 mg by mouth daily.   LATUDA 20 MG Tabs tablet Generic drug:  lurasidone Take 20 mg by mouth daily.   levothyroxine 50 MCG tablet Commonly known as:  SYNTHROID, LEVOTHROID Take 50 mcg by mouth daily before breakfast.   methocarbamol 500 MG tablet Commonly known as:  ROBAXIN Take 500 mg by mouth daily as needed for muscle spasms (Give one tablet by mouth at 9am and 9pm).   nystatin cream Commonly known as:  MYCOSTATIN Apply 1 application topically 2 (two) times daily. Apply to red areas on vaginal and buttocks til resolved   oxyCODONE-acetaminophen 5-325 MG tablet Commonly known as:  PERCOCET/ROXICET Take 1 tablet by mouth daily as needed for severe pain.   oxyCODONE-acetaminophen 5-325 MG tablet Commonly known as:  PERCOCET/ROXICET  Take one tablet by mouth every 12 hours for chronic pain. Do not exceed 4gm of Tylenol in 24 hours   simvastatin 20 MG tablet Commonly known as:  ZOCOR Take 20 mg by mouth at bedtime.   UNABLE TO FIND Med Name: Med pass 60 mL by mouth 2 times daily. Stop date 06/12/16   UNABLE TO FIND Med Name: Med pass 90 mL by mouth 2 times daily   vitamin B-12 1000 MCG tablet Commonly known as:  CYANOCOBALAMIN Take 1,000 mcg by mouth daily.       Physical exam BP 125/72   Pulse 73   Temp 97.9 F (36.6 C) (Oral)   Resp 20   Ht 5\' 4"  (1.626 m)   Wt 168 lb 8 oz  (76.4 kg)   SpO2 99%   BMI 28.92 kg/m   Wt Readings from Last 3 Encounters:  06/09/16 168 lb 8 oz (76.4 kg)  04/18/16 186 lb 8 oz (84.6 kg)  03/28/16 187 lb 14.4 oz (85.2 kg)   General- elderly overweight female in no acute distress Head- atraumatic, normocephalic Eyes- no pallor, no icterus, no discharge Neck- no lymphadenopathy Nose- no nasal discharge Mouth- normal mucus membrane Cardiovascular- normal s1,s2, no murmur Respiratory- bilateral clear to auscultation Abdomen- bowel sounds present, soft, non tender Musculoskeletal- able to move all 4 extremities, generalized weakness Neurological- alert and oriented to person and place only Skin- warm and dry Psychiatry- flat affect    Labs reviewed CBC Latest Ref Rng & Units 05/11/2016 11/13/2015 10/01/2015  WBC 10:3/mL 6.4 7.6 9.2  Hemoglobin 12.0 - 16.0 g/dL 11.2(A) 11.8(A) 12.2  Hematocrit 36 - 46 % 34(A) 36 38  Platelets 150 - 399 K/L 191 210 218   CMP Latest Ref Rng & Units 05/11/2016 11/13/2015 10/01/2015  Glucose 70 - 99 mg/dL - - -  BUN 4 - 21 mg/dL 8 14 13   Creatinine 0.5 - 1.1 mg/dL 0.6 0.6 0.7  Sodium 621137 - 147 mmol/L 145 143 147  Potassium 3.4 - 5.3 mmol/L 3.2(A) 3.9 4.6  Chloride 96 - 112 mEq/L - - -  CO2 19 - 32 mEq/L - - -  Calcium 8.4 - 10.5 mg/dL - - -  Total Protein 6.0 - 8.3 g/dL - - -  Total Bilirubin 0.3 - 1.2 mg/dL - - -  Alkaline Phos 25 - 125 U/L 41 42 43  AST 13 - 35 U/L 10(A) 10(A) 10(A)  ALT 7 - 35 U/L 6(A) 7 8   Lab Results  Component Value Date   TSH 2.23 11/13/2015   Lab Results  Component Value Date   HGBA1C 5.5 10/01/2015   Lipid Panel     Component Value Date/Time   CHOL 125 02/26/2016   TRIG 134 02/26/2016   HDL 52 02/26/2016   LDLCALC 47 02/26/2016     Assessement/plan  Weight loss Ongoing. Poor po intake likely contributing to it. Check tsh with her history of hypothyroidism. She is on prozac and this can contribute some to weight loss/ anorexia. Make adjustment to  prozac as below. Add remeron that will help with her mood and stimulate her appetite. Continue ehr supplements. Followed by RD.   Hypothyroidism Reviewed tsh, check new level. Continue levothyroxine 50 mcg daily  Major chronic depression Currently on prozac 80 mg daily. Decrease this to 40 mg daily for 1 week and then 20 mg daily x 1 week and 10 mg daily x 1 week and stop. In the meanwhile, start remeron 15 mg  daily x 1 week and then increase to 30 mg daily and monitor.     Oneal GroutMAHIMA Shawon Denzer, MD Internal Medicine [facility] [address] [place], [place] [address] Cell Phone (Monday-Friday 8 am - 5 pm): [phone] On Call: [phone] and follow prompts after 5 pm and on weekends Office Phone: [phone] Office Fax: [phone]

## 2016-07-25 ENCOUNTER — Encounter: Payer: Self-pay | Admitting: Internal Medicine

## 2016-07-25 ENCOUNTER — Non-Acute Institutional Stay (SKILLED_NURSING_FACILITY): Payer: Medicare Other | Admitting: Internal Medicine

## 2016-07-25 DIAGNOSIS — M48061 Spinal stenosis, lumbar region without neurogenic claudication: Secondary | ICD-10-CM | POA: Diagnosis not present

## 2016-07-25 DIAGNOSIS — G894 Chronic pain syndrome: Secondary | ICD-10-CM

## 2016-07-25 NOTE — Progress Notes (Signed)
Patient ID: Pam Jackson, female   DOB: 09-18-1940, 76 y.o.   MRN: [id number]      [facility]    Chief Complaint  Patient presents with  . Medical Management of Chronic Issues    Routine Visit     Advanced Directives 01/14/2016  Does Patient Have a Medical Advance Directive? Yes  Type of Advance Directive Out of facility DNR (pink MOST or yellow form)  Does patient want to make changes to medical advance directive? No - Patient declined  Copy of Healthcare Power of Attorney in Chart? Yes  Would patient like information on creating a medical advance directive? -  Pre-existing out of facility DNR order (yellow form or pink MOST form) -    Allergies: valtrex  Code status: DNR  HPI 76 y/o female patient is seen for routine visit. She denies any concern. No new concern from nursing staff. She is watching music videos this am.  Review of Systems   Constitutional: Negative for fever HENT: Negative for dysphagia and sore throat.   Respiratory: Negative for cough, shortness of breath  Cardiovascular: Negative for chest pain, palpitations.  Gastrointestinal: Negative for heartburn, nausea, vomiting, abdominal pain  Genitourinary: Negative for dysuria.  Musculoskeletal: Negative for back pain, fall Neurological: Negative for dizziness    Past Medical History:  Diagnosis Date  . Anxiety   . Bipolar 1 disorder (HCC)   . Depression   . Genital herpes   . GERD (gastroesophageal reflux disease)   . Hyperlipidemia 03/31/2016  . Spinal stenosis   . Thyroid disease    hypothyroid  . Vitamin D deficiency      Medication reviewed. See [facility] Allergies as of 07/25/2016      Reactions   Valtrex [valacyclovir Hcl] Other (See Comments)   Causes dizziness      Medication List       Accurate as of 07/25/16  5:01 PM. Always use your most recent med list.          acetaminophen 650 MG CR tablet Commonly known as:  TYLENOL Take 1,300 mg by  mouth 2 (two) times daily.   aspirin 81 MG chewable tablet Chew 81 mg by mouth daily.   cholecalciferol 1000 units tablet Commonly known as:  VITAMIN D Take 2,000 Units by mouth daily.   divalproex 500 MG DR tablet Commonly known as:  DEPAKOTE Take 1,000 mg by mouth at bedtime.   hydrocortisone cream 1 % Apply 1 application topically 3 (three) times daily as needed for itching.   lamoTRIgine 200 MG tablet Commonly known as:  LAMICTAL Take 200 mg by mouth daily.   LATUDA 20 MG Tabs tablet Generic drug:  lurasidone Take 20 mg by mouth daily.   levothyroxine 50 MCG tablet Commonly known as:  SYNTHROID, LEVOTHROID Take 50 mcg by mouth daily before breakfast.   methocarbamol 500 MG tablet Commonly known as:  ROBAXIN Take 500 mg by mouth daily as needed for muscle spasms (Give one tablet by mouth at 9am and 9pm).   mirtazapine 30 MG tablet Commonly known as:  REMERON Take 30 mg by mouth at bedtime.   multivitamin with minerals tablet Take 1 tablet by mouth daily.   nystatin cream Commonly known as:  MYCOSTATIN Apply 1 application topically 2 (two) times daily. Apply to red areas on vaginal and buttocks til resolved   oxyCODONE-acetaminophen 5-325 MG tablet Commonly known as:  PERCOCET/ROXICET Take 1 tablet by mouth daily as needed  for severe pain.   oxyCODONE-acetaminophen 5-325 MG tablet Commonly known as:  PERCOCET/ROXICET Take one tablet by mouth every 12 hours for chronic pain. Do not exceed 4gm of Tylenol in 24 hours   simvastatin 20 MG tablet Commonly known as:  ZOCOR Take 20 mg by mouth at bedtime.   UNABLE TO FIND Med Name: Med pass 90 mL by mouth 2 times daily   vitamin B-12 1000 MCG tablet Commonly known as:  CYANOCOBALAMIN Take 1,000 mcg by mouth daily.       Physical exam BP 128/72   Pulse 79   Temp (!) 96.2 F (35.7 C) (Oral)   Resp 18   Ht 5\' 4"  (1.626 m)   Wt 167 lb 8 oz (76 kg)   SpO2 93%   BMI 28.75 kg/m   Wt Readings from Last 3  Encounters:  07/25/16 167 lb 8 oz (76 kg)  06/09/16 168 lb 8 oz (76.4 kg)  04/18/16 186 lb 8 oz (84.6 kg)   General- elderly overweight female in no acute distress Head- atraumatic, normocephalic Neck- no lymphadenopathy Nose- no nasal discharge Mouth- normal mucus membrane Cardiovascular- normal s1,s2, no murmur Respiratory- bilateral clear to auscultation Abdomen- bowel sounds present, soft, non tender Musculoskeletal- able to move all 4 extremities, generalized weakness Neurological- alert and oriented to person and place only Skin- warm and dry Psychiatry- more interactive and appears to be in a good mood today   Labs reviewed CBC Latest Ref Rng & Units 05/11/2016 11/13/2015 10/01/2015  WBC 10:3/mL 6.4 7.6 9.2  Hemoglobin 12.0 - 16.0 g/dL 11.2(A) 11.8(A) 12.2  Hematocrit 36 - 46 % 34(A) 36 38  Platelets 150 - 399 K/L 191 210 218   CMP Latest Ref Rng & Units 05/11/2016 11/13/2015 10/01/2015  Glucose 70 - 99 mg/dL - - -  BUN 4 - 21 mg/dL 8 14 13   Creatinine 0.5 - 1.1 mg/dL 0.6 0.6 0.7  Sodium 191137 - 147 mmol/L 145 143 147  Potassium 3.4 - 5.3 mmol/L 3.2(A) 3.9 4.6  Chloride 96 - 112 mEq/L - - -  CO2 19 - 32 mEq/L - - -  Calcium 8.4 - 10.5 mg/dL - - -  Total Protein 6.0 - 8.3 g/dL - - -  Total Bilirubin 0.3 - 1.2 mg/dL - - -  Alkaline Phos 25 - 125 U/L 41 42 43  AST 13 - 35 U/L 10(A) 10(A) 10(A)  ALT 7 - 35 U/L 6(A) 7 8   Lab Results  Component Value Date   TSH 2.23 11/13/2015   Lab Results  Component Value Date   HGBA1C 5.5 10/01/2015   Lipid Panel     Component Value Date/Time   CHOL 125 02/26/2016   TRIG 134 02/26/2016   HDL 52 02/26/2016   LDLCALC 47 02/26/2016     Assessement/plan  Lumbar stenosis without neurogenic claudication Stable at present on current regimen of Percocet. See below. Fall precautions. Back Precautions to be taken.  Chronic pain syndrome Continue Robaxin 500 mg twice a day and daily as needed for muscle spasm and Percocet 5-325  milligrams 1 tablet twice a day and one tablet daily as needed for pain.   Oneal GroutMAHIMA Abdoulaye Drum, MD Internal Medicine [facility] [address] [place], [place] [address] Cell Phone (Monday-Friday 8 am - 5 pm): [phone] On Call: [phone] and follow prompts after 5 pm and on weekends Office Phone: [phone] Office Fax: [phone]

## 2016-09-12 ENCOUNTER — Non-Acute Institutional Stay (SKILLED_NURSING_FACILITY): Payer: Medicare Other | Admitting: Internal Medicine

## 2016-09-12 ENCOUNTER — Encounter: Payer: Self-pay | Admitting: Internal Medicine

## 2016-09-12 DIAGNOSIS — D519 Vitamin B12 deficiency anemia, unspecified: Secondary | ICD-10-CM | POA: Diagnosis not present

## 2016-09-12 DIAGNOSIS — E7849 Other hyperlipidemia: Secondary | ICD-10-CM

## 2016-09-12 DIAGNOSIS — E784 Other hyperlipidemia: Secondary | ICD-10-CM | POA: Diagnosis not present

## 2016-09-12 DIAGNOSIS — E039 Hypothyroidism, unspecified: Secondary | ICD-10-CM | POA: Diagnosis not present

## 2016-09-12 NOTE — Progress Notes (Signed)
Patient ID: Pam Jackson, female   DOB: [birth year], 76 y.o.   MRN: [id number]      [facility]    Chief Complaint  Patient presents with  . Medical Management of Chronic Issues    Routine Visit     Advanced Directives 01/14/2016  Does Patient Have a Medical Advance Directive? Yes  Type of Advance Directive Out of facility DNR (pink MOST or yellow form)  Does patient want to make changes to medical advance directive? No - Patient declined  Copy of Healthcare Power of Attorney in Chart? Yes  Would patient like information on creating a medical advance directive? -  Pre-existing out of facility DNR order (yellow form or pink MOST form) -    Allergies  Allergen Reactions  . Valtrex [Valacyclovir Hcl] Other (See Comments)    Causes dizziness    HPI 76 y/o female patient is seen for routine visit. She denies any concern. Per nursing, she has been getting skin care for rash to her back. She has been compliant with her medications.   Review of Systems   Constitutional: Negative for fever HENT: Negative for dysphagia and sore throat.   Respiratory: Negative for cough, shortness of breath  Cardiovascular: Negative for chest pain, palpitations.  Gastrointestinal: Negative for heartburn, nausea, vomiting, abdominal pain  Genitourinary: Negative for dysuria.  Musculoskeletal: Negative for back pain, fall Neurological: Negative for dizziness    Past Medical History:  Diagnosis Date  . Anxiety   . Bipolar 1 disorder (HCC)   . Depression   . Genital herpes   . GERD (gastroesophageal reflux disease)   . Hyperlipidemia 03/31/2016  . Spinal stenosis   . Thyroid disease    hypothyroid  . Vitamin D deficiency      Medication reviewed. See MAR Allergies as of 09/12/2016      Reactions   Valtrex [valacyclovir Hcl] Other (See Comments)   Causes dizziness      Medication List       Accurate as of 09/12/16 11:51 AM. Always use your most  recent med list.          acetaminophen 650 MG CR tablet Commonly known as:  TYLENOL Take 1,300 mg by mouth 2 (two) times daily.   aspirin 81 MG chewable tablet Chew 81 mg by mouth daily.   cholecalciferol 1000 units tablet Commonly known as:  VITAMIN D Take 2,000 Units by mouth daily.   divalproex 500 MG DR tablet Commonly known as:  DEPAKOTE Take 1,000 mg by mouth at bedtime.   hydrocortisone cream 1 % Apply 1 application topically 3 (three) times daily as needed for itching.   lamoTRIgine 200 MG tablet Commonly known as:  LAMICTAL Take 200 mg by mouth daily.   LATUDA 20 MG Tabs tablet Generic drug:  lurasidone Take 20 mg by mouth daily.   levothyroxine 50 MCG tablet Commonly known as:  SYNTHROID, LEVOTHROID Take 50 mcg by mouth daily before breakfast.   methocarbamol 500 MG tablet Commonly known as:  ROBAXIN Take 500 mg by mouth daily as needed for muscle spasms (Give one tablet by mouth at 9am and 9pm).   mirtazapine 30 MG tablet Commonly known as:  REMERON Take 30 mg by mouth at bedtime.   multivitamin with minerals tablet Take 1 tablet by mouth daily.   nystatin cream Commonly known as:  MYCOSTATIN Apply 1 application topically 2 (two) times daily. Apply to red areas on vaginal and buttocks til  resolved   oxyCODONE-acetaminophen 5-325 MG tablet Commonly known as:  PERCOCET/ROXICET Take 1 tablet by mouth daily as needed for severe pain.   oxyCODONE-acetaminophen 5-325 MG tablet Commonly known as:  PERCOCET/ROXICET Take one tablet by mouth every 12 hours for chronic pain. Do not exceed 4gm of Tylenol in 24 hours   PROBIOTIC ACIDOPHILUS PO Take 250 mg by mouth 2 (two) times daily. 09/14/16   silver sulfADIAZINE 1 % cream Commonly known as:  SILVADENE Apply 1 application topically 2 (two) times daily.   simvastatin 20 MG tablet Commonly known as:  ZOCOR Take 20 mg by mouth at bedtime.   UNABLE TO FIND Med Name: Med pass 90 mL by mouth 2 times  daily   vitamin B-12 1000 MCG tablet Commonly known as:  CYANOCOBALAMIN Take 1,000 mcg by mouth daily.       Physical exam BP 117/61   Pulse 80   Temp (!) 96.2 F (35.7 C) (Oral)   Resp 20   Ht 5\' 4"  (1.626 m)   Wt 174 lb 12.8 oz (79.3 kg)   SpO2 94%   BMI 30.00 kg/m   Wt Readings from Last 3 Encounters:  09/12/16 174 lb 12.8 oz (79.3 kg)  07/25/16 167 lb 8 oz (76 kg)  06/09/16 168 lb 8 oz (76.4 kg)   General- elderly obese female in no acute distress Head- atraumatic, normocephalic Neck- no lymphadenopathy Nose- no nasal discharge Mouth- normal mucus membrane Cardiovascular- normal s1,s2, no murmur Respiratory- bilateral clear to auscultation Abdomen- bowel sounds present, soft, non tender Musculoskeletal- able to move all 4 extremities, generalized weakness Neurological- alert and oriented to person and place only, not to time Skin- warm and dry, refuses to get her back examined Psychiatry- flat affect   Labs reviewed CBC Latest Ref Rng & Units 05/11/2016 11/13/2015 10/01/2015  WBC 10:3/mL 6.4 7.6 9.2  Hemoglobin 12.0 - 16.0 g/dL 11.2(A) 11.8(A) 12.2  Hematocrit 36 - 46 % 34(A) 36 38  Platelets 150 - 399 K/L 191 210 218   CMP Latest Ref Rng & Units 05/11/2016 11/13/2015 10/01/2015  Glucose 70 - 99 mg/dL - - -  BUN 4 - 21 mg/dL 8 14 13   Creatinine 0.5 - 1.1 mg/dL 0.6 0.6 0.7  Sodium 161137 - 147 mmol/L 145 143 147  Potassium 3.4 - 5.3 mmol/L 3.2(A) 3.9 4.6  Chloride 96 - 112 mEq/L - - -  CO2 19 - 32 mEq/L - - -  Calcium 8.4 - 10.5 mg/dL - - -  Total Protein 6.0 - 8.3 g/dL - - -  Total Bilirubin 0.3 - 1.2 mg/dL - - -  Alkaline Phos 25 - 125 U/L 41 42 43  AST 13 - 35 U/L 10(A) 10(A) 10(A)  ALT 7 - 35 U/L 6(A) 7 8   Lab Results  Component Value Date   TSH 2.23 11/13/2015   Lab Results  Component Value Date   HGBA1C 5.5 10/01/2015   Lipid Panel     Component Value Date/Time   CHOL 125 02/26/2016   TRIG 134 02/26/2016   HDL 52 02/26/2016   LDLCALC 47  02/26/2016     Assessement/plan  Acquired hypothyroidism Lab Results  Component Value Date   TSH 2.23 11/13/2015   Continue levothyroxine 50 mcg daily  Hyperlipidemia Lipid Panel     Component Value Date/Time   CHOL 125 02/26/2016   TRIG 134 02/26/2016   HDL 52 02/26/2016   LDLCALC 47 02/26/2016   LDL at goal. Continue simvastatin  20 mg daily  Vitamin b12 deficiency Continue b12 supplement 1000 mcg daily and monitor    Oneal Grout, MD Internal Medicine [facility] [address] [place], [place] [address] Cell Phone (Monday-Friday 8 am - 5 pm): [phone] On Call: [phone] and follow prompts after 5 pm and on weekends Office Phone: [phone] Office Fax: [phone]

## 2016-10-04 LAB — HEPATIC FUNCTION PANEL
ALT: 9 U/L (ref 7–35)
AST: 11 U/L — AB (ref 13–35)
Alkaline Phosphatase: 64 U/L (ref 25–125)
BILIRUBIN, TOTAL: 0.2 mg/dL

## 2016-10-04 LAB — CBC AND DIFFERENTIAL
HCT: 34 % — AB (ref 36–46)
HEMOGLOBIN: 10.6 g/dL — AB (ref 12.0–16.0)
Platelets: 255 10*3/uL (ref 150–399)
WBC: 11.6 10^3/mL

## 2016-10-04 LAB — BASIC METABOLIC PANEL
BUN: 20 mg/dL (ref 4–21)
CREATININE: 0.7 mg/dL (ref 0.5–1.1)
Glucose: 115 mg/dL
Potassium: 4.5 mmol/L (ref 3.4–5.3)
SODIUM: 147 mmol/L (ref 137–147)

## 2016-10-04 LAB — LIPID PANEL
CHOLESTEROL: 155 mg/dL (ref 0–200)
HDL: 41 mg/dL (ref 35–70)
LDL Cholesterol: 69 mg/dL
TRIGLYCERIDES: 225 mg/dL — AB (ref 40–160)

## 2016-10-04 LAB — HEMOGLOBIN A1C: HEMOGLOBIN A1C: 5.9

## 2016-10-04 LAB — TSH: TSH: 2.97 u[IU]/mL (ref 0.41–5.90)

## 2016-10-20 ENCOUNTER — Non-Acute Institutional Stay (SKILLED_NURSING_FACILITY): Payer: Medicare Other | Admitting: Internal Medicine

## 2016-10-20 ENCOUNTER — Encounter: Payer: Self-pay | Admitting: Internal Medicine

## 2016-10-20 DIAGNOSIS — R21 Rash and other nonspecific skin eruption: Secondary | ICD-10-CM | POA: Diagnosis not present

## 2016-10-20 DIAGNOSIS — E039 Hypothyroidism, unspecified: Secondary | ICD-10-CM | POA: Diagnosis not present

## 2016-10-20 DIAGNOSIS — N952 Postmenopausal atrophic vaginitis: Secondary | ICD-10-CM | POA: Diagnosis not present

## 2016-10-20 DIAGNOSIS — M62838 Other muscle spasm: Secondary | ICD-10-CM

## 2016-10-20 DIAGNOSIS — R300 Dysuria: Secondary | ICD-10-CM

## 2016-10-20 NOTE — Progress Notes (Signed)
Patient ID: Pam Jackson, female   DOB: 1941-03-04, 76 y.o.   MRN: [id number]      [facility]    Chief Complaint  Patient presents with  . Medical Management of Chronic Issues    Routine Visit     Advanced Directives 01/14/2016  Does Patient Have a Medical Advance Directive? Yes  Type of Advance Directive Out of facility DNR (pink MOST or yellow form)  Does patient want to make changes to medical advance directive? No - Patient declined  Copy of Healthcare Power of Attorney in Chart? Yes  Would patient like information on creating a medical advance directive? -  Pre-existing out of facility DNR order (yellow form or pink MOST form) -    Allergies  Allergen Reactions  . Valtrex [Valacyclovir Hcl] Other (See Comments)    Causes dizziness   Code status: DNR  HPI 76 y/o female patient is seen for routine visit. She complaints of spasm to her legs right > left. She also complaints of vaginal itching for few days. Per CNA, she has noticed scratch marks and raw area around her vagina. She complaints of discomfort with urination. Denies flank pain.   Review of Systems   Constitutional: Negative for fever, chills HENT: Negative for dysphagia and sore throat.   Respiratory: Negative for cough, shortness of breath and wheezing  Cardiovascular: Negative for chest pain, palpitations.  Gastrointestinal: Negative for heartburn, nausea, vomiting, abdominal pain  Musculoskeletal: Negative for back pain, fall Neurological: Negative for dizziness    Past Medical History:  Diagnosis Date  . Anxiety   . Bipolar 1 disorder (HCC)   . Depression   . Genital herpes   . GERD (gastroesophageal reflux disease)   . Hyperlipidemia 03/31/2016  . Spinal stenosis   . Thyroid disease    hypothyroid  . Vitamin D deficiency      Medication reviewed. See MAR Allergies as of 10/20/2016      Reactions   Valtrex [valacyclovir Hcl] Other (See Comments)   Causes  dizziness      Medication List       Accurate as of 10/20/16  1:26 PM. Always use your most recent med list.          acetaminophen 650 MG CR tablet Commonly known as:  TYLENOL Take 1,300 mg by mouth 2 (two) times daily.   aspirin 81 MG chewable tablet Chew 81 mg by mouth daily.   Cholecalciferol 50000 units capsule Take 50,000 Units by mouth once a week. Stop date 01/02/17. Then take monthly   divalproex 500 MG DR tablet Commonly known as:  DEPAKOTE Take 1,000 mg by mouth at bedtime.   hydrocortisone cream 1 % Apply 1 application topically 3 (three) times daily as needed for itching.   lamoTRIgine 200 MG tablet Commonly known as:  LAMICTAL Take 200 mg by mouth daily.   LATUDA 20 MG Tabs tablet Generic drug:  lurasidone Take 20 mg by mouth daily.   levothyroxine 50 MCG tablet Commonly known as:  SYNTHROID, LEVOTHROID Take 50 mcg by mouth daily before breakfast.   methocarbamol 500 MG tablet Commonly known as:  ROBAXIN Take 500 mg by mouth daily as needed for muscle spasms (Give one tablet by mouth at 9am and 9pm).   mirtazapine 30 MG tablet Commonly known as:  REMERON Take 30 mg by mouth at bedtime.   multivitamin with minerals tablet Take 1 tablet by mouth daily.   nystatin cream Commonly  known as:  MYCOSTATIN Apply 1 application topically 2 (two) times daily. Apply to red areas on vaginal and buttocks til resolved   oxyCODONE-acetaminophen 5-325 MG tablet Commonly known as:  PERCOCET/ROXICET Take 1 tablet by mouth daily as needed for severe pain.   oxyCODONE-acetaminophen 5-325 MG tablet Commonly known as:  PERCOCET/ROXICET Take one tablet by mouth every 12 hours for chronic pain. Do not exceed 4gm of Tylenol in 24 hours   simvastatin 20 MG tablet Commonly known as:  ZOCOR Take 20 mg by mouth at bedtime.   UNABLE TO FIND Med Name: Med pass 90 mL by mouth 2 times daily   vitamin B-12 1000 MCG tablet Commonly known as:  CYANOCOBALAMIN Take 1,000  mcg by mouth daily.       Physical exam BP 131/65   Pulse 80   Temp 97 F (36.1 C) (Oral)   Resp 20   Ht  (1.626 m)   Wt 173 lb (78.5 kg)   SpO2 93%   BMI 29.70 kg/m   Wt Readings from Last 3 Encounters:  10/20/16 173 lb (78.5 kg)  09/12/16 174 lb 12.8 oz (79.3 kg)  07/25/16 167 lb 8 oz (76 kg)   General- elderly overweight female in no acute distress Head- atraumatic, normocephalic Neck- no lymphadenopathy Nose- no nasal discharge Mouth- normal mucus membrane Cardiovascular- normal s1,s2, no murmur Respiratory- bilateral clear to auscultation Abdomen- bowel sounds present, soft, non tender, no guarding or rigidity Genitalia- scratch marks to her labia with red raw area, dry vaginal area on exam, no drainage, no bleed Musculoskeletal- able to move all 4 extremities, generalized weakness lower extremities > upper extremities Neurological- alert and oriented to person and place only, not to time Skin- warm and dry, erythematous area to anterior neck crease, no signs of infection Psychiatry- flat affect, hateful and mean during this visit   Labs reviewed CBC Latest Ref Rng & Units 10/04/2016 05/11/2016 11/13/2015  WBC 10:3/mL 11.6 6.4 7.6  Hemoglobin 12.0 - 16.0 g/dL 10.6(A) 11.2(A) 11.8(A)  Hematocrit 36 - 46 % 34(A) 34(A) 36  Platelets 150 - 399 K/L 255 191 210   CMP Latest Ref Rng & Units 10/04/2016 05/11/2016 11/13/2015  Glucose 70 - 99 mg/dL - - -  BUN 4 - 21 mg/dL Creatinine 0.5 - 1.1 mg/dL 0.7 0.6 0.6  Sodium 161 - 147 mmol/L 147 145 143  Potassium 3.4 - 5.3 mmol/L 4.5 3.2(A) 3.9  Chloride 96 - 112 mEq/L - - -  CO2 19 - 32 mEq/L - - -  Calcium 8.4 - 10.5 mg/dL - - -  Total Protein 6.0 - 8.3 g/dL - - -  Total Bilirubin 0.3 - 1.2 mg/dL - - -  Alkaline Phos 25 - 125 U/L 64 41 42  AST 13 - 35 U/L 11(A) 10(A) 10(A)  ALT 7 - 35 U/L 9 6(A) 7   Lab Results  Component Value Date   TSH 2.97 10/04/2016   Lab Results  Component Value Date   HGBA1C 5.9  10/04/2016   Lipid Panel     Component Value Date/Time   CHOL 155 10/04/2016   TRIG 225 (A) 10/04/2016   HDL 41 10/04/2016   LDLCALC 69 10/04/2016     Assessement/plan  Neck rash Keep skin area clean and dry and apply hydrocortisone cream 1% bid x 10 days and reassess if no improvement  Atrophic vaginitis Keep perineal area clean and dry. Add estrogen vaginal cream daily x 2  weeks, then 3 days a week and monitor. Avoid scratching the area. Monitor for signs of infection  Dysuria Send u/a with c/s, encouraged hydration  Leg spasm Currently on robaxin 500 mg bid with additional dosing at 2 pm if needed. Change this to 500 mg tid and monitor. Has lumbar spinal stenosis with leg pain. Continue pain med, no changes made. Get PMR consult.   Acquired hypothyroidism tsh 4/3 2.972. Continue levothyroxine 50 mcg daily    Oneal Grout, MD Internal Medicine [facility] [address] [place], [place] [address] Cell Phone (Monday-Friday 8 am - 5 pm): [phone] On Call: [phone] and follow prompts after 5 pm and on weekends Office Phone: [phone] Office Fax: [phone]

## 2016-12-01 ENCOUNTER — Non-Acute Institutional Stay (SKILLED_NURSING_FACILITY): Payer: Medicare Other

## 2016-12-01 DIAGNOSIS — Z0000 Encounter for general adult medical examination without abnormal findings: Secondary | ICD-10-CM

## 2016-12-01 NOTE — Progress Notes (Signed)
Subjective:   Pam Jackson is a 76 y.o. female who presents for an Initial Medicare Annual Wellness Visit at [facility]     Objective:    Today's Vitals   12/01/16 1400  BP: 120/72  Pulse: 85  Temp: 98 F (36.7 C)  TempSrc: Oral  SpO2: 94%  Weight: 173 lb (78.5 kg)  Height: 5\' 4"  (1.626 m)   Body mass index is 29.7 kg/m.   Current Medications (verified) Outpatient Encounter Prescriptions as of 12/01/2016  Medication Sig  . acetaminophen (TYLENOL) 650 MG CR tablet Take 1,300 mg by mouth 2 (two) times daily.   Marland Kitchen aspirin 81 MG chewable tablet Chew 81 mg by mouth daily.  . Cholecalciferol 50000 units capsule Take 50,000 Units by mouth once a week. Stop date 01/02/17. Then take monthly  . divalproex (DEPAKOTE) 500 MG DR tablet Take 1,000 mg by mouth at bedtime.   . hydrocortisone cream 1 % Apply 1 application topically 3 (three) times daily as needed for itching.  . lamoTRIgine (LAMICTAL) 200 MG tablet Take 200 mg by mouth daily.  Marland Kitchen levothyroxine (SYNTHROID, LEVOTHROID) 50 MCG tablet Take 50 mcg by mouth daily before breakfast.  . lurasidone (LATUDA) 20 MG TABS tablet Take 20 mg by mouth daily.  . methocarbamol (ROBAXIN) 500 MG tablet Take 500 mg by mouth daily as needed for muscle spasms (Give one tablet by mouth at 9am and 9pm).   . mirtazapine (REMERON) 30 MG tablet Take 30 mg by mouth at bedtime.  . Multiple Vitamins-Minerals (MULTIVITAMIN WITH MINERALS) tablet Take 1 tablet by mouth daily.  Marland Kitchen nystatin cream (MYCOSTATIN) Apply 1 application topically 2 (two) times daily. Apply to red areas on vaginal and buttocks til resolved  . oxyCODONE-acetaminophen (PERCOCET/ROXICET) 5-325 MG tablet Take 1 tablet by mouth daily as needed for severe pain.  Marland Kitchen oxyCODONE-acetaminophen (PERCOCET/ROXICET) 5-325 MG tablet Take one tablet by mouth every 12 hours for chronic pain. Do not exceed 4gm of Tylenol in 24 hours  . simvastatin (ZOCOR) 20 MG tablet Take 20 mg by mouth  at bedtime.  Marland Kitchen UNABLE TO FIND Med Name: Med pass 90 mL by mouth 2 times daily  . vitamin B-12 (CYANOCOBALAMIN) 1000 MCG tablet Take 1,000 mcg by mouth daily.   No facility-administered encounter medications on file as of 12/01/2016.     Allergies (verified) Valtrex [valacyclovir hcl]   History: Past Medical History:  Diagnosis Date  . Anxiety   . Bipolar 1 disorder (HCC)   . Depression   . Genital herpes   . GERD (gastroesophageal reflux disease)   . Hyperlipidemia 03/31/2016  . Spinal stenosis   . Thyroid disease    hypothyroid  . Vitamin D deficiency    Past Surgical History:  Procedure Laterality Date  . ABDOMINAL HYSTERECTOMY    . HAMMER TOE SURGERY    . NASAL SINUS SURGERY     Family History  Problem Relation Age of Onset  . Heart disease Father   . Hyperlipidemia Father   . Cancer Sister   . Hyperlipidemia Sister   . Hyperlipidemia Brother   . Mental illness Brother    Social History   Occupational History  . Not on file.   Social History Main Topics  . Smoking status: Never Smoker  . Smokeless tobacco: Never Used  . Alcohol use No  . Drug use: No  . Sexual activity: Not Currently    Tobacco Counseling Counseling given: Not Answered   Activities of  Daily Living In your present state of health, do you have any difficulty performing the following activities: 12/01/2016  Hearing? N  Vision? N  Difficulty concentrating or making decisions? N  Walking or climbing stairs? Y  Dressing or bathing? Y  Doing errands, shopping? Y  Preparing Food and eating ? Y  Using the Toilet? Y  In the past six months, have you accidently leaked urine? Y  Do you have problems with loss of bowel control? N  Managing your Medications? Y  Managing your Finances? Y  Housekeeping or managing your Housekeeping? Y  Some recent data might be hidden    Immunizations and Health Maintenance Immunization History  Administered Date(s) Administered  . Influenza-Unspecified  04/11/2014, 04/15/2015, 07/27/2016  . PPD Test 11/12/2013, 04/03/2014, 03/20/2015  . Pneumococcal-Unspecified 07/05/2011   There are no preventive care reminders to display for this patient.  Patient Care Team: Tally JoeSwayne, David, MD as PCP - General (Family Medicine)  Indicate any recent Medical Services you may have received from other than [facility] providers in the past year (date may be approximate).     Assessment:   This [facility]   Hearing/Vision screen No exam data present  Dietary issues and exercise activities discussed: Current Exercise Habits: The patient does not participate in regular exercise at present, Exercise limited by: orthopedic condition(s)  Goals    . Maintain Lifestyle          Starting today pt will maintain lifestyle.       Depression Screen PHQ 2/9 Scores 12/01/2016  PHQ - 2 Score 3  PHQ- 9 Score 4    Fall Risk Fall Risk  12/01/2016  Falls in the past year? Yes  Number falls in past yr: 1  Injury with Fall? Yes    Cognitive Function:     6CIT Screen 12/01/2016  What Year? 4 points  What month? 3 points  What time? 0 points  Count back from 20 4 points  Months in reverse 4 points  Repeat phrase 8 points  Total Score 23    Screening Tests Health Maintenance  Topic Date Due  . PNA vac Low Risk Adult (2 of 2 - PCV13) 07/04/2017 (Originally 07/04/2012)  . DEXA SCAN  07/05/2023 (Originally 04/03/2008)  . TETANUS/TDAP  07/05/2023 (Originally 04/03/1962)  . COLONOSCOPY  10/21/2026 (Originally 04/03/1993)  . INFLUENZA VACCINE  02/01/2017  . MAMMOGRAM  04/27/2018      Plan:    I have personally reviewed and addressed the Medicare Annual Wellness questionnaire and have noted the following in the patient's chart:  A. Medical and social history B. Use of alcohol, tobacco or illicit drugs  C. Current medications and supplements D. Functional ability and status E.  Nutritional status F.  Physical  activity G. Advance directives H. List of other physicians I.  Hospitalizations, surgeries, and ER visits in previous 12 months J.  Vitals K. Screenings to include hearing, vision, cognitive, depression L. Referrals and appointments - none  In addition, I have reviewed and discussed with patient certain preventive protocols, quality metrics, and best practice recommendations. A written personalized care plan for preventive services as well as general preventive health recommendations were provided to patient.  See attached scanned questionnaire for additional information.   Signed,   Annetta MawSara Gonthier, RN Nurse Health Advisor   Quick Notes   Health Maintenance: PNA 23 due, but pt is leaving facility today.     Abnormal Screen: 6 CIT-23  Patient Concerns: None      Nurse Concerns: None

## 2016-12-01 NOTE — Patient Instructions (Signed)
Pam Jackson , Thank you for taking time to come for your Medicare Wellness Visit. I appreciate your ongoing commitment to your health goals. Please review the following plan we discussed and let me know if I can assist you in the future.   Screening recommendations/referrals: Colonoscopy long term resident Mammogram long term resident Bone Density due. Recommended yearly ophthalmology/optometry visit for glaucoma screening and checkup Recommended yearly dental visit for hygiene and checkup  Vaccinations: Influenza vaccine up to date Pneumococcal vaccine 23 due. Tdap vaccine not in records Shingles vaccine not in records  Advanced directives: Need copy for chart  Conditions/risks identified: None  Next appointment: None upcoming   Preventive Care 65 Years and Older, Female Preventive care refers to lifestyle choices and visits with your health care provider that can promote health and wellness. What does preventive care include?  A yearly physical exam. This is also called an annual well check.  Dental exams once or twice a year.  Routine eye exams. Ask your health care provider how often you should have your eyes checked.  Personal lifestyle choices, including:  Daily care of your teeth and gums.  Regular physical activity.  Eating a healthy diet.  Avoiding tobacco and drug use.  Limiting alcohol use.  Practicing safe sex.  Taking low-dose aspirin every day.  Taking vitamin and mineral supplements as recommended by your health care provider. What happens during an annual well check? The services and screenings done by your health care provider during your annual well check will depend on your age, overall health, lifestyle risk factors, and family history of disease. Counseling  Your health care provider may ask you questions about your:  Alcohol use.  Tobacco use.  Drug use.  Emotional well-being.  Home and relationship well-being.  Sexual  activity.  Eating habits.  History of falls.  Memory and ability to understand (cognition).  Work and work Astronomerenvironment.  Reproductive health. Screening  You may have the following tests or measurements:  Height, weight, and BMI.  Blood pressure.  Lipid and cholesterol levels. These may be checked every 5 years, or more frequently if you are over 74 years old.  Skin check.  Lung cancer screening. You may have this screening every year starting at age 74 if you have a 30-pack-year history of smoking and currently smoke or have quit within the past 15 years.  Fecal occult blood test (FOBT) of the stool. You may have this test every year starting at age 74.  Flexible sigmoidoscopy or colonoscopy. You may have a sigmoidoscopy every 5 years or a colonoscopy every 10 years starting at age 74.  Hepatitis C blood test.  Hepatitis B blood test.  Sexually transmitted disease (STD) testing.  Diabetes screening. This is done by checking your blood sugar (glucose) after you have not eaten for a while (fasting). You may have this done every 1-3 years.  Bone density scan. This is done to screen for osteoporosis. You may have this done starting at age 74.  Mammogram. This may be done every 1-2 years. Talk to your health care provider about how often you should have regular mammograms. Talk with your health care provider about your test results, treatment options, and if necessary, the need for more tests. Vaccines  Your health care provider may recommend certain vaccines, such as:  Influenza vaccine. This is recommended every year.  Tetanus, diphtheria, and acellular pertussis (Tdap, Td) vaccine. You may need a Td booster every 10 years.  Zoster vaccine. You  may need this after age 85.  Pneumococcal 13-valent conjugate (PCV13) vaccine. One dose is recommended after age 46.  Pneumococcal polysaccharide (PPSV23) vaccine. One dose is recommended after age 94. Talk to your health care  provider about which screenings and vaccines you need and how often you need them. This information is not intended to replace advice given to you by your health care provider. Make sure you discuss any questions you have with your health care provider. Document Released: 07/17/2015 Document Revised: 03/09/2016 Document Reviewed: 04/21/2015 Elsevier Interactive Patient Education  2017 [employer] Prevention in the Home Falls can cause injuries. They can happen to people of all ages. There are many things you can do to make your home safe and to help prevent falls. What can I do on the outside of my home?  Regularly fix the edges of walkways and driveways and fix any cracks.  Remove anything that might make you trip as you walk through a door, such as a raised step or threshold.  Trim any bushes or trees on the path to your home.  Use bright outdoor lighting.  Clear any walking paths of anything that might make someone trip, such as rocks or tools.  Regularly check to see if handrails are loose or broken. Make sure that both sides of any steps have handrails.  Any raised decks and porches should have guardrails on the edges.  Have any leaves, snow, or ice cleared regularly.  Use sand or salt on walking paths during winter.  Clean up any spills in your garage right away. This includes oil or grease spills. What can I do in the bathroom?  Use night lights.  Install grab bars by the toilet and in the tub and shower. Do not use towel bars as grab bars.  Use non-skid mats or decals in the tub or shower.  If you need to sit down in the shower, use a plastic, non-slip stool.  Keep the floor dry. Clean up any water that spills on the floor as soon as it happens.  Remove soap buildup in the tub or shower regularly.  Attach bath mats securely with double-sided non-slip rug tape.  Do not have throw rugs and other things on the floor that can make you trip. What can I do in  the bedroom?  Use night lights.  Make sure that you have a light by your bed that is easy to reach.  Do not use any sheets or blankets that are too big for your bed. They should not hang down onto the floor.  Have a firm chair that has side arms. You can use this for support while you get dressed.  Do not have throw rugs and other things on the floor that can make you trip. What can I do in the kitchen?  Clean up any spills right away.  Avoid walking on wet floors.  Keep items that you use a lot in easy-to-reach places.  If you need to reach something above you, use a strong step stool that has a grab bar.  Keep electrical cords out of the way.  Do not use floor polish or wax that makes floors slippery. If you must use wax, use non-skid floor wax.  Do not have throw rugs and other things on the floor that can make you trip. What can I do with my stairs?  Do not leave any items on the stairs.  Make sure that there are handrails on both  sides of the stairs and use them. Fix handrails that are broken or loose. Make sure that handrails are as long as the stairways.  Check any carpeting to make sure that it is firmly attached to the stairs. Fix any carpet that is loose or worn.  Avoid having throw rugs at the top or bottom of the stairs. If you do have throw rugs, attach them to the floor with carpet tape.  Make sure that you have a light switch at the top of the stairs and the bottom of the stairs. If you do not have them, ask someone to add them for you. What else can I do to help prevent falls?  Wear shoes that:  Do not have high heels.  Have rubber bottoms.  Are comfortable and fit you well.  Are closed at the toe. Do not wear sandals.  If you use a stepladder:  Make sure that it is fully opened. Do not climb a closed stepladder.  Make sure that both sides of the stepladder are locked into place.  Ask someone to hold it for you, if possible.  Clearly mark and  make sure that you can see:  Any grab bars or handrails.  First and last steps.  Where the edge of each step is.  Use tools that help you move around (mobility aids) if they are needed. These include:  Canes.  Walkers.  Scooters.  Crutches.  Turn on the lights when you go into a dark area. Replace any light bulbs as soon as they burn out.  Set up your furniture so you have a clear path. Avoid moving your furniture around.  If any of your floors are uneven, fix them.  If there are any pets around you, be aware of where they are.  Review your medicines with your doctor. Some medicines can make you feel dizzy. This can increase your chance of falling. Ask your doctor what other things that you can do to help prevent falls. This information is not intended to replace advice given to you by your health care provider. Make sure you discuss any questions you have with your health care provider. Document Released: 04/16/2009 Document Revised: 11/26/2015 Document Reviewed: 07/25/2014 Elsevier Interactive Patient Education  2017 [employer].

## 2017-02-15 ENCOUNTER — Other Ambulatory Visit: Payer: Self-pay | Admitting: Adult Health

## 2017-02-15 DIAGNOSIS — Z8673 Personal history of transient ischemic attack (TIA), and cerebral infarction without residual deficits: Secondary | ICD-10-CM

## 2017-02-27 ENCOUNTER — Ambulatory Visit
Admission: RE | Admit: 2017-02-27 | Discharge: 2017-02-27 | Disposition: A | Discharge status: Home / Self Care | Payer: Medicare Other | Source: Ambulatory Visit | Attending: Adult Health | Admitting: Adult Health

## 2017-02-27 DIAGNOSIS — Z8673 Personal history of transient ischemic attack (TIA), and cerebral infarction without residual deficits: Secondary | ICD-10-CM | POA: Diagnosis present

## 2018-01-31 ENCOUNTER — Other Ambulatory Visit
Admission: RE | Admit: 2018-01-31 | Discharge: 2018-01-31 | Disposition: A | Discharge status: Home / Self Care | Payer: Medicare Other | Source: Other Acute Inpatient Hospital | Attending: Family Medicine | Admitting: Family Medicine

## 2018-01-31 DIAGNOSIS — L0390 Cellulitis, unspecified: Secondary | ICD-10-CM | POA: Diagnosis present

## 2018-02-03 LAB — AEROBIC CULTURE  (SUPERFICIAL SPECIMEN): GRAM STAIN: NONE SEEN

## 2018-02-03 LAB — AEROBIC CULTURE W GRAM STAIN (SUPERFICIAL SPECIMEN)

## 2018-02-08 ENCOUNTER — Other Ambulatory Visit
Admission: RE | Admit: 2018-02-08 | Discharge: 2018-02-08 | Disposition: A | Discharge status: Home / Self Care | Payer: Medicare Other | Source: Ambulatory Visit | Attending: Family Medicine | Admitting: Family Medicine

## 2018-02-08 DIAGNOSIS — L03012 Cellulitis of left finger: Secondary | ICD-10-CM | POA: Insufficient documentation

## 2018-02-08 LAB — GENTAMICIN LEVEL, PEAK: Gentamicin Pk: 6 ug/mL (ref 5.0–10.0)

## 2018-09-21 ENCOUNTER — Other Ambulatory Visit: Payer: Self-pay

## 2018-09-21 NOTE — Patient Outreach (Signed)
[facility] Customer service manager [facility]) Care Management  09/21/2018  Pam Jackson [birth year] [id number]   Medication Adherence call to Pam Jackson patient is at a long term facility at this time patient is showing past due on Simvastatin 20 mg under [facility] Ins.   Lillia Abed CPhT Pharmacy Technician [facility] Care Management Direct Dial [phone]  Fax [phone] [email or web address]

## 2019-07-12 ENCOUNTER — Other Ambulatory Visit: Payer: Self-pay

## 2019-07-12 ENCOUNTER — Emergency Department: Payer: Medicare Other

## 2019-07-12 ENCOUNTER — Emergency Department
Admission: EM | Admit: 2019-07-12 | Discharge: 2019-07-12 | Disposition: A | Discharge status: Home / Self Care | Payer: Medicare Other | Attending: Emergency Medicine | Admitting: Emergency Medicine

## 2019-07-12 ENCOUNTER — Encounter: Payer: Self-pay | Admitting: Emergency Medicine

## 2019-07-12 DIAGNOSIS — E039 Hypothyroidism, unspecified: Secondary | ICD-10-CM | POA: Insufficient documentation

## 2019-07-12 DIAGNOSIS — J189 Pneumonia, unspecified organism: Secondary | ICD-10-CM | POA: Diagnosis not present

## 2019-07-12 DIAGNOSIS — F0390 Unspecified dementia without behavioral disturbance: Secondary | ICD-10-CM | POA: Insufficient documentation

## 2019-07-12 DIAGNOSIS — R0602 Shortness of breath: Secondary | ICD-10-CM | POA: Diagnosis present

## 2019-07-12 LAB — TROPONIN I (HIGH SENSITIVITY): Troponin I (High Sensitivity): 5 ng/L (ref <18)

## 2019-07-12 LAB — CBC WITH DIFFERENTIAL/PLATELET
Abs Immature Granulocytes: 0.3 10*3/uL — ABNORMAL HIGH (ref 0.00–0.07)
Basophils Absolute: 0 10*3/uL (ref 0.0–0.1)
Basophils Relative: 0 %
Eosinophils Absolute: 0 10*3/uL (ref 0.0–0.5)
Eosinophils Relative: 0 %
HCT: 40.1 % (ref 36.0–46.0)
Hemoglobin: 13.3 g/dL (ref 12.0–15.0)
Immature Granulocytes: 2 %
Lymphocytes Relative: 12 %
Lymphs Abs: 1.8 10*3/uL (ref 0.7–4.0)
MCH: 32.5 pg (ref 26.0–34.0)
MCHC: 33.2 g/dL (ref 30.0–36.0)
MCV: 98 fL (ref 80.0–100.0)
Monocytes Absolute: 0.6 10*3/uL (ref 0.1–1.0)
Monocytes Relative: 4 %
Neutro Abs: 12.1 10*3/uL — ABNORMAL HIGH (ref 1.7–7.7)
Neutrophils Relative %: 82 %
Platelets: 270 10*3/uL (ref 150–400)
RBC: 4.09 MIL/uL (ref 3.87–5.11)
RDW: 15.8 % — ABNORMAL HIGH (ref 11.5–15.5)
WBC: 14.8 10*3/uL — ABNORMAL HIGH (ref 4.0–10.5)
nRBC: 0 % (ref 0.0–0.2)

## 2019-07-12 LAB — BASIC METABOLIC PANEL
Anion gap: 10 (ref 5–15)
BUN: 20 mg/dL (ref 8–23)
CO2: 29 mmol/L (ref 22–32)
Calcium: 8.7 mg/dL — ABNORMAL LOW (ref 8.9–10.3)
Chloride: 101 mmol/L (ref 98–111)
Creatinine, Ser: 0.54 mg/dL (ref 0.44–1.00)
GFR calc Af Amer: >60 mL/min (ref >60)
GFR calc non Af Amer: >60 mL/min (ref >60)
Glucose, Bld: 172 mg/dL — ABNORMAL HIGH (ref 70–99)
Potassium: 3 mmol/L — ABNORMAL LOW (ref 3.5–5.1)
Sodium: 140 mmol/L (ref 135–145)

## 2019-07-12 LAB — BLOOD GAS, VENOUS
Acid-Base Excess: 8.6 mmol/L — ABNORMAL HIGH (ref 0.0–2.0)
Bicarbonate: 33.5 mmol/L — ABNORMAL HIGH (ref 20.0–28.0)
O2 Saturation: 68.5 %
Patient temperature: 37
pCO2, Ven: 46 mmHg (ref 44.0–60.0)
pH, Ven: 7.47 — ABNORMAL HIGH (ref 7.250–7.430)
pO2, Ven: 33 mmHg (ref 32.0–45.0)

## 2019-07-12 LAB — BRAIN NATRIURETIC PEPTIDE: B Natriuretic Peptide: 90 pg/mL (ref 0.0–100.0)

## 2019-07-12 MED ORDER — VANCOMYCIN HCL IN DEXTROSE 1-5 GM/200ML-% IV SOLN
1000.0000 mg | Freq: Once | INTRAVENOUS | Status: AC
  Administered 2019-07-12: 1000 mg via INTRAVENOUS
  Filled 2019-07-12: qty 200

## 2019-07-12 MED ORDER — IOHEXOL 350 MG/ML SOLN
75.0000 mL | Freq: Once | INTRAVENOUS | Status: AC | PRN
  Administered 2019-07-12: 18:00:00 75 mL via INTRAVENOUS

## 2019-07-12 MED ORDER — PIPERACILLIN-TAZOBACTAM 3.375 G IVPB 30 MIN
3.3750 g | Freq: Once | INTRAVENOUS | Status: AC
  Administered 2019-07-12: 3.375 g via INTRAVENOUS
  Filled 2019-07-12: qty 50

## 2019-07-12 MED ORDER — AMOXICILLIN-POT CLAVULANATE 875-125 MG PO TABS: 1.0000 | ORAL_TABLET | Freq: Two times a day (BID) | ORAL | 0 refills | Status: AC

## 2019-07-12 MED ORDER — AMOXICILLIN-POT CLAVULANATE 875-125 MG PO TABS: 1.0000 | ORAL_TABLET | Freq: Two times a day (BID) | ORAL | 0 refills | Status: DC

## 2019-07-12 NOTE — ED Triage Notes (Signed)
Arrives from [facility] with c/o worsening SOB and sats x 2 days.  Per report, patient had CXR that showed left pneumothorax.  Patient had been placed on 4l/ [place] [facility] after COVID diagnosis in November. EMS also indicates that pt has altered mental status.   Pt arrives with most form

## 2019-07-12 NOTE — ED Notes (Signed)
Pt found to be incontinent of urine with a wet brief. Pericare performed by this RN and Gust Brooms, Charity fundraiser. Purewick external catheter placed at this time.

## 2019-07-12 NOTE — ED Provider Notes (Signed)
[facility] Emergency Department Provider Note       Time seen: ----------------------------------------- 4:47 PM on 07/12/2019 ----------------------------------------- Patient cannot give further review of systems or report.  I have reviewed the triage vital signs and the nursing notes.  HISTORY   Chief Complaint Chest Injury    HPI Pam Jackson is a 79 y.o. female with a history of anxiety, bipolar disorder, depression, GERD, hyperlipidemia, spinal stenosis who presents to the ED for worsening shortness of breath and oxygen saturations over the past 2 days.  Per report she had a chest x-ray that showed a large pleural effusion and pneumothorax.  She has been placed on 4 L nasal cannula after Covid diagnosis in November.  She also has altered mental status.  She has a DNR with a MOST form that says comfort measures.  Past Medical History:  Diagnosis Date  . Anxiety   . Bipolar 1 disorder ([place])   . Depression   . Genital herpes   . GERD (gastroesophageal reflux disease)   . Hyperlipidemia 03/31/2016  . Spinal stenosis   . Thyroid disease    hypothyroid  . Vitamin D deficiency     Patient Active Problem List   Diagnosis Date Noted  . Chronic major depressive disorder, recurrent episode ([place]) 04/19/2016  . Hyperlipidemia 03/31/2016  . Bipolar disorder with depression ([place]) 02/24/2016  . Chronic pain syndrome 11/25/2015  . B12 deficiency anemia 05/13/2015  . Dementia with behavioral disturbance ([place]) 01/20/2015  . Mixed bipolar I disorder ([place]) 01/20/2015  . Thyroid activity decreased 11/07/2014  . Spinal stenosis, lumbar region, without neurogenic claudication 10/20/2014  . Gastroesophageal reflux disease without esophagitis 10/20/2014  . Vitamin D deficiency 10/08/2014  . Hypothyroidism 04/06/2014  . Lumbar stenosis without neurogenic claudication 04/06/2014  . GERD (gastroesophageal reflux disease) 04/06/2014  . Constipation 04/06/2014   . Bipolar disorder ([place]) 04/06/2014  . Depression 04/06/2014  . UTI (lower urinary tract infection) 12/08/2013    Past Surgical History:  Procedure Laterality Date  . ABDOMINAL HYSTERECTOMY    . HAMMER TOE SURGERY    . NASAL SINUS SURGERY      Allergies Valtrex [valacyclovir hcl]  Social History Social History   Tobacco Use  . Smoking status: Never Smoker  . Smokeless tobacco: Never Used  Substance Use Topics  . Alcohol use: No  . Drug use: No   Review of Systems Unknown, patient has a history of dementia  All systems negative/normal/unremarkable except as stated in the HPI  ____________________________________________   PHYSICAL EXAM:  VITAL SIGNS: ED Triage Vitals  Enc Vitals Group     BP 07/12/19 1636 115/67     Pulse Rate 07/12/19 1636 80     Resp 07/12/19 1636 13     Temp 07/12/19 1636 98.5 F (36.9 C)     Temp Source 07/12/19 1636 Oral     SpO2 07/12/19 1636 99 %     Weight 07/12/19 1638 173 lb 1 oz (78.5 kg)     Height 07/12/19 1638 5\' 3"  (1.6 m)     Head Circumference --      [facility] Flow --      Pain Score --      Pain Loc --      Pain Edu? --      Excl. in [place]? --     Constitutional: Alert but disoriented, no obvious distress  Eyes: Conjunctivae are normal. Normal extraocular movements. ENT      Head: Normocephalic and atraumatic.  Nose: No congestion/rhinnorhea.      Mouth/Throat: Mucous membranes are moist.      Neck: No stridor. Cardiovascular: Normal rate, regular rhythm. No murmurs, rubs, or gallops. Respiratory: Hypoventilation with crackles on the left side Gastrointestinal: Soft and nontender. Normal bowel sounds Musculoskeletal: Limited range of motion of the extremities Neurologic: No gross focal neurologic deficits are appreciated.  Generalized weakness Skin:  Skin is warm, dry and intact. No rash noted. Psychiatric: Flat affect ____________________________________________  EKG: Interpreted by me.  Sinus rhythm with rate of  82 bpm, normal PR interval, normal QRS, normal QT  ____________________________________________  ED COURSE:  As part of my medical decision making, I reviewed the following data within the electronic MEDICAL RECORD NUMBER History obtained from family if available, nursing notes, old chart and ekg, as well as notes from prior ED visits. Patient presented for dyspnea, we will assess with labs and imaging as indicated at this time.   Procedures  [name] was evaluated in Emergency Department on 07/12/2019 for the symptoms described in the history of present illness. She was evaluated in the context of the global COVID-19 pandemic, which necessitated consideration that the patient might be at risk for infection with the SARS-CoV-2 virus that causes COVID-19. Institutional protocols and algorithms that pertain to the evaluation of patients at risk for COVID-19 are in a state of rapid change based on information released by regulatory bodies including the CDC and federal and state organizations. These policies and algorithms were followed during the patient's care in the ED.  ____________________________________________   LABS (pertinent positives/negatives)  Labs Reviewed  CBC WITH DIFFERENTIAL/PLATELET - Abnormal; Notable for the following components:      Result Value   WBC 14.8 (*)    RDW 15.8 (*)    Neutro Abs 12.1 (*)    Abs Immature Granulocytes 0.30 (*)    All other components within normal limits  BASIC METABOLIC PANEL - Abnormal; Notable for the following components:   Potassium 3.0 (*)    Glucose, Bld 172 (*)    Calcium 8.7 (*)    All other components within normal limits  BLOOD GAS, VENOUS - Abnormal; Notable for the following components:   pH, Ven 7.47 (*)    Bicarbonate 33.5 (*)    Acid-Base Excess 8.6 (*)    All other components within normal limits  CULTURE, BLOOD (ROUTINE X 2)  CULTURE, BLOOD (ROUTINE X 2)  BRAIN NATRIURETIC PEPTIDE  TROPONIN I (HIGH SENSITIVITY)     RADIOLOGY Images were viewed by me  Chest x-ray  IMPRESSION:  New opacity at the left mid to lower lung, suspect combination of  pleural fluid and airspace disease/atelectasis/possible pneumonia  IMPRESSION:  No evidence of pulmonary emboli.   Considerable mucous plugging within the left lower lobe bronchus  with associated left lower lobe consolidation. This likely  represents focal pneumonia although aspiration could present in this  fashion.   Hiatal hernia   Aortic Atherosclerosis (ICD10-I70.0).  ____________________________________________   DIFFERENTIAL DIAGNOSIS   CHF, COPD, pneumonia, pneumothorax, COVID-19  FINAL ASSESSMENT AND PLAN  Dyspnea, hypoxia, healthcare associated pneumonia   Plan: The patient had presented for outpatient chest x-ray which revealed a pneumothorax and pleural effusion. Patient's labs did reveal leukocytosis. Patient's imaging revealed a left lower lobe consolidation for which she was given Vanco and Zosyn.  She possibly could have aspirated as well.  She is DNR and on comfort care.  I will encourage oral antibiotics and outpatient follow-up as needed.  Ulice Dash, MD    Note: This note was generated in part or whole with voice recognition software. Voice recognition is usually quite accurate but there are transcription errors that can and very often do occur. I apologize for any typographical errors that were not detected and corrected.     Emily Filbert, MD 07/12/19 [phone]

## 2019-07-12 NOTE — ED Notes (Signed)
Called lab to inform them that I can't access sunquest. Labwork sent with chart labels.

## 2019-07-12 NOTE — ED Notes (Signed)
2nd trop and 2nd bc sent to lab

## 2019-07-17 LAB — CULTURE, BLOOD (ROUTINE X 2)
Culture: NO GROWTH
Culture: NO GROWTH
Special Requests: ADEQUATE
Special Requests: ADEQUATE

## 2020-06-02 ENCOUNTER — Emergency Department
Admission: EM | Admit: 2020-06-02 | Discharge: 2020-06-03 | Disposition: E | Payer: Medicare Other | Attending: Emergency Medicine | Admitting: Emergency Medicine

## 2020-06-02 ENCOUNTER — Emergency Department: Payer: Medicare Other

## 2020-06-02 ENCOUNTER — Other Ambulatory Visit: Payer: Self-pay

## 2020-06-02 DIAGNOSIS — E039 Hypothyroidism, unspecified: Secondary | ICD-10-CM | POA: Insufficient documentation

## 2020-06-02 DIAGNOSIS — J690 Pneumonitis due to inhalation of food and vomit: Secondary | ICD-10-CM | POA: Insufficient documentation

## 2020-06-02 DIAGNOSIS — Z79899 Other long term (current) drug therapy: Secondary | ICD-10-CM | POA: Insufficient documentation

## 2020-06-02 DIAGNOSIS — N390 Urinary tract infection, site not specified: Secondary | ICD-10-CM | POA: Diagnosis not present

## 2020-06-02 DIAGNOSIS — Z7982 Long term (current) use of aspirin: Secondary | ICD-10-CM | POA: Diagnosis not present

## 2020-06-02 DIAGNOSIS — R0989 Other specified symptoms and signs involving the circulatory and respiratory systems: Secondary | ICD-10-CM | POA: Diagnosis present

## 2020-06-02 DIAGNOSIS — A419 Sepsis, unspecified organism: Secondary | ICD-10-CM | POA: Diagnosis not present

## 2020-06-02 DIAGNOSIS — F0391 Unspecified dementia with behavioral disturbance: Secondary | ICD-10-CM | POA: Insufficient documentation

## 2020-06-02 LAB — CBC WITH DIFFERENTIAL/PLATELET
Abs Immature Granulocytes: 0.29 10*3/uL — ABNORMAL HIGH (ref 0.00–0.07)
Basophils Absolute: 0.1 10*3/uL (ref 0.0–0.1)
Basophils Relative: 0 %
Eosinophils Absolute: 0.3 10*3/uL (ref 0.0–0.5)
Eosinophils Relative: 1 %
HCT: 46.1 % — ABNORMAL HIGH (ref 36.0–46.0)
Hemoglobin: 14.3 g/dL (ref 12.0–15.0)
Immature Granulocytes: 1 %
Lymphocytes Relative: 67 %
Lymphs Abs: 13.2 10*3/uL — ABNORMAL HIGH (ref 0.7–4.0)
MCH: 31.7 pg (ref 26.0–34.0)
MCHC: 31 g/dL (ref 30.0–36.0)
MCV: 102.2 fL — ABNORMAL HIGH (ref 80.0–100.0)
Monocytes Absolute: 0.6 10*3/uL (ref 0.1–1.0)
Monocytes Relative: 3 %
Neutro Abs: 5.6 10*3/uL (ref 1.7–7.7)
Neutrophils Relative %: 28 %
Platelets: 240 10*3/uL (ref 150–400)
RBC: 4.51 MIL/uL (ref 3.87–5.11)
RDW: 15.5 % (ref 11.5–15.5)
WBC: 20.1 10*3/uL — ABNORMAL HIGH (ref 4.0–10.5)
nRBC: 0 % (ref 0.0–0.2)

## 2020-06-02 LAB — URINALYSIS, COMPLETE (UACMP) WITH MICROSCOPIC
Bilirubin Urine: NEGATIVE
Glucose, UA: NEGATIVE mg/dL
Ketones, ur: NEGATIVE mg/dL
Nitrite: POSITIVE — AB
Protein, ur: 100 mg/dL — AB
Specific Gravity, Urine: 1.026 (ref 1.005–1.030)
WBC, UA: >50 WBC/hpf — ABNORMAL HIGH (ref 0–5)
pH: 5 (ref 5.0–8.0)

## 2020-06-02 LAB — COMPREHENSIVE METABOLIC PANEL
ALT: 61 U/L — ABNORMAL HIGH (ref 0–44)
AST: 139 U/L — ABNORMAL HIGH (ref 15–41)
Albumin: 2.8 g/dL — ABNORMAL LOW (ref 3.5–5.0)
Alkaline Phosphatase: 89 U/L (ref 38–126)
Anion gap: 15 (ref 5–15)
BUN: 15 mg/dL (ref 8–23)
CO2: 23 mmol/L (ref 22–32)
Calcium: 8.8 mg/dL — ABNORMAL LOW (ref 8.9–10.3)
Chloride: 106 mmol/L (ref 98–111)
Creatinine, Ser: 0.88 mg/dL (ref 0.44–1.00)
GFR, Estimated: >60 mL/min (ref >60)
Glucose, Bld: 183 mg/dL — ABNORMAL HIGH (ref 70–99)
Potassium: 3.8 mmol/L (ref 3.5–5.1)
Sodium: 144 mmol/L (ref 135–145)
Total Bilirubin: 0.4 mg/dL (ref 0.3–1.2)
Total Protein: 6.3 g/dL — ABNORMAL LOW (ref 6.5–8.1)

## 2020-06-02 LAB — BRAIN NATRIURETIC PEPTIDE: B Natriuretic Peptide: 131.4 pg/mL — ABNORMAL HIGH (ref 0.0–100.0)

## 2020-06-02 LAB — LACTIC ACID, PLASMA: Lactic Acid, Venous: 6.7 mmol/L (ref 0.5–1.9)

## 2020-06-02 LAB — TROPONIN I (HIGH SENSITIVITY): Troponin I (High Sensitivity): 12 ng/L (ref <18)

## 2020-06-02 MED ORDER — LACTATED RINGERS IV BOLUS
1000.0000 mL | Freq: Once | INTRAVENOUS | Status: AC
  Administered 2020-06-02: 1000 mL via INTRAVENOUS

## 2020-06-02 MED ORDER — SODIUM CHLORIDE 0.9 % IV BOLUS
1000.0000 mL | Freq: Once | INTRAVENOUS | Status: AC
  Administered 2020-06-02: 1000 mL via INTRAVENOUS

## 2020-06-02 MED ORDER — SODIUM CHLORIDE 0.9 % IV SOLN
1.0000 g | Freq: Once | INTRAVENOUS | Status: AC
  Administered 2020-06-02: 1 g via INTRAVENOUS
  Filled 2020-06-02: qty 1

## 2020-06-03 NOTE — Progress Notes (Signed)
Chaplain provided supportive presence to family (including daughter and partner, sister) at time of admission, during discussions, extubation, and death. Family reports Pam Jackson had expressed for many years she was read to "go see Jesus." She had recent changes in behavior including stopping eating and withdrawing from people. Pam Jackson faith helped her with her lifelong struggle with depression. Once family understood her prognosis they were understanding and accepting. They were able to share stories before leaving.  Family will be using [facility] in [place].     06/30/2020 2100  Clinical Encounter Type  Visited With Patient and family together  Visit Type ED;Death  Referral From Physician  Spiritual Encounters  Spiritual Needs Emotional;Grief support;Prayer

## 2020-06-03 NOTE — ED Triage Notes (Addendum)
Pt here via [facility] from [facility] after choking episode on goats cheese followed by cardiac arrest. EMS performed 1 round of cpr, pulses returned at 1810.  Pt here with IO present to R knee, king airway in place. No breath sounds auscultated on R lower.   Per EMS pt has copy of MOST form, no original copy available from facility.   Pt intubated on arrival to ED by MD Malinda. 23 @ R lip.

## 2020-06-03 NOTE — ED Notes (Signed)
Pt is extubated. Pt family and chaplain at bedside with pt.

## 2020-06-03 NOTE — ED Notes (Signed)
Jewelry and belongings given to pt daughter.

## 2020-06-03 NOTE — ED Provider Notes (Addendum)
[facility] Emergency Department Provider Note   ____________________________________________   First MD Initiated Contact with Patient 06/14/20 1856     (approximate)  I have reviewed the triage vital signs and the nursing notes.   HISTORY  Chief Complaint Choking History limited by patient's unresponsiveness  HPI Pam Jackson is a 79 y.o. female who was DNR/DNI but was changed to full code after she choked.  She was eating and choked on goat she is had a cardiac arrest got one round of CPR and was intubated with [facility] airway.  She has an IO in the right knee.  In the emergency room patient arrived with a heart rate of 131 bilateral breath sounds King airway in place.  King airway was removed to go cheese was sucked out of the mouth and patient was intubated under direct vision using glide scope seven and half ET tube.  Patient tolerated this well.  She is making some respiratory movements and chewing a little bit.         Past Medical History:  Diagnosis Date  . Anxiety   . Bipolar 1 disorder (HCC)   . Depression   . Genital herpes   . GERD (gastroesophageal reflux disease)   . Hyperlipidemia 03/31/2016  . Spinal stenosis   . Thyroid disease    hypothyroid  . Vitamin D deficiency     Patient Active Problem List   Diagnosis Date Noted  . Chronic major depressive disorder, recurrent episode (HCC) 04/19/2016  . Hyperlipidemia 03/31/2016  . Bipolar disorder with depression (HCC) 02/24/2016  . Chronic pain syndrome 11/25/2015  . B12 deficiency anemia 05/13/2015  . Dementia with behavioral disturbance (HCC) 01/20/2015  . Mixed bipolar I disorder (HCC) 01/20/2015  . Thyroid activity decreased 11/07/2014  . Spinal stenosis, lumbar region, without neurogenic claudication 10/20/2014  . Gastroesophageal reflux disease without esophagitis 10/20/2014  . Vitamin D deficiency 10/08/2014  . Hypothyroidism 04/06/2014  . Lumbar stenosis without  neurogenic claudication 04/06/2014  . GERD (gastroesophageal reflux disease) 04/06/2014  . Constipation 04/06/2014  . Bipolar disorder (HCC) 04/06/2014  . Depression 04/06/2014  . UTI (lower urinary tract infection) 12/08/2013    Past Surgical History:  Procedure Laterality Date  . ABDOMINAL HYSTERECTOMY    . HAMMER TOE SURGERY    . NASAL SINUS SURGERY      Prior to Admission medications   Medication Sig Start Date End Date Taking? Authorizing Provider  acetaminophen (TYLENOL) 650 MG CR tablet Take 1,300 mg by mouth 2 (two) times daily.     [provider]  aspirin 81 MG chewable tablet Chew 81 mg by mouth daily.    [provider]  Cholecalciferol 50000 units capsule Take 50,000 Units by mouth once a week. Stop date 01/02/17. Then take monthly    [provider]  divalproex (DEPAKOTE) 500 MG DR tablet Take 1,000 mg by mouth at bedtime.     [provider]  hydrocortisone cream 1 % Apply 1 application topically 3 (three) times daily as needed for itching.    [provider]  lamoTRIgine (LAMICTAL) 200 MG tablet Take 200 mg by mouth daily.    [provider]  levothyroxine (SYNTHROID, LEVOTHROID) 50 MCG tablet Take 50 mcg by mouth daily before breakfast.    [provider]  lurasidone (LATUDA) 20 MG TABS tablet Take 20 mg by mouth daily.    [provider]  methocarbamol (ROBAXIN) 500 MG tablet Take 500 mg by mouth daily as  needed for muscle spasms (Give one tablet by mouth at 9am and 9pm).     [provider]  mirtazapine (REMERON) 30 MG tablet Take 30 mg by mouth at bedtime.    [provider]  Multiple Vitamins-Minerals (MULTIVITAMIN WITH MINERALS) tablet Take 1 tablet by mouth daily.    [provider]  nystatin cream (MYCOSTATIN) Apply 1 application topically 2 (two) times daily. Apply to red areas on vaginal and buttocks til resolved    [provider]  oxyCODONE-acetaminophen  (PERCOCET/ROXICET) 5-325 MG tablet Take 1 tablet by mouth daily as needed for severe pain.    [provider]  oxyCODONE-acetaminophen (PERCOCET/ROXICET) 5-325 MG tablet Take one tablet by mouth every 12 hours for chronic pain. Do not exceed 4gm of Tylenol in 24 hours 02/26/16   Kirt Boysarter, Monica, DO  simvastatin (ZOCOR) 20 MG tablet Take 20 mg by mouth at bedtime.    [provider]  UNABLE TO FIND Med Name: Med pass 90 mL by mouth 2 times daily    [provider]  vitamin B-12 (CYANOCOBALAMIN) 1000 MCG tablet Take 1,000 mcg by mouth daily.    [provider]    Allergies Valtrex [valacyclovir hcl]  Family History  Problem Relation Age of Onset  . Heart disease Father   . Hyperlipidemia Father   . Cancer Sister   . Hyperlipidemia Sister   . Hyperlipidemia Brother   . Mental illness Brother     Social History Social History   Tobacco Use  . Smoking status: Never Smoker  . Smokeless tobacco: Never Used  Substance Use Topics  . Alcohol use: No  . Drug use: No    Review of Systems Unavailable due to unresponsiveness ____________________________________________   PHYSICAL EXAM:  VITAL SIGNS: ED Triage Vitals  Enc Vitals Group     BP --      Pulse Rate 2020/01/24 1849 (!) 130     Resp 2020/01/24 1849 16     Temp 2020/01/24 1857 (!) 95.4 F (35.2 C)     Temp src --      SpO2 2020/01/24 1849 100 %     Weight 2020/01/24 1850 221 lb 9 oz (100.5 kg)     Height 2020/01/24 1850 5\' 3"  (1.6 m)     Head Circumference --      Peak Flow --      Pain Score --      Pain Loc --      Pain Edu? --      Excl. in GC? --     Constitutional: Unresponsive Eyes: Conjunctivae are normal.  Head: Atraumatic. Nose: No congestion/rhinnorhea. Mouth/Throat: Mucous membranes are moist.  Oropharynx non-erythematous.  There is still some goat cheese in the throat. Neck: No stridor.  Cardiovascular: Rapid rate, regular rhythm. Grossly normal heart sounds.  Good peripheral  circulation. Respiratory: Occasional slight respiratory effort.  No retractions. Lungs scattered crackles Gastrointestinal: Soft and nontender.  Somewhat distended . No abdominal bruits.  Musculoskeletal: No lower extremity tenderness nor edema.   Neurologic: Patient unresponsive is making some movements with her mouth and tongue. Skin:  Skin is warm, dry and intact. No rash noted.   ____________________________________________   LABS (all labs ordered are listed, but only abnormal results are displayed)  Labs Reviewed  COMPREHENSIVE METABOLIC PANEL - Abnormal; Notable for the following components:      Result Value   Glucose, Bld 183 (*)    Calcium 8.8 (*)    Total Protein 6.3 (*)  Albumin 2.8 (*)    AST 139 (*)    ALT 61 (*)    All other components within normal limits  BRAIN NATRIURETIC PEPTIDE - Abnormal; Notable for the following components:   B Natriuretic Peptide 131.4 (*)    All other components within normal limits  LACTIC ACID, PLASMA - Abnormal; Notable for the following components:   Lactic Acid, Venous 6.7 (*)    All other components within normal limits  CBC WITH DIFFERENTIAL/PLATELET - Abnormal; Notable for the following components:   WBC 20.1 (*)    HCT 46.1 (*)    MCV 102.2 (*)    Lymphs Abs 13.2 (*)    Abs Immature Granulocytes 0.29 (*)    All other components within normal limits  URINALYSIS, COMPLETE (UACMP) WITH MICROSCOPIC - Abnormal; Notable for the following components:   Color, Urine AMBER (*)    APPearance TURBID (*)    Hgb urine dipstick SMALL (*)    Protein, ur 100 (*)    Nitrite POSITIVE (*)    Leukocytes,Ua LARGE (*)    WBC, UA >50 (*)    Bacteria, UA MANY (*)    All other components within normal limits  LACTIC ACID, PLASMA  PATHOLOGIST SMEAR REVIEW  TROPONIN I (HIGH SENSITIVITY)  TROPONIN I (HIGH SENSITIVITY)   ____________________________________________  EKG  EKG read interpreted by me shows sinus tach rate of 131 normal axis  no acute ST-T wave changes there is one PVC I can see. ____________________________________________  RADIOLOGY Jill Poling, personally viewed and evaluated these images (plain radiographs) as part of my medical decision making, as well as reviewing the written report by the radiologist.  ED MD interpretation:       Official radiology report(s): DG Chest Portable 1 View  Result Date: 06-12-2020 CLINICAL DATA:  Check endotracheal tube placement EXAM: PORTABLE CHEST 1 VIEW COMPARISON:  Film from earlier in the same day. FINDINGS: Cardiac shadow is within normal limits. Endotracheal tube and gastric catheter are again seen in satisfactory position. Endotracheal tube is approximately 3.4 cm above the carina. Right lung is well aerated without focal infiltrate. Left lung demonstrates a large pneumothorax which appears to have increased slightly in the interval from the prior exam. No significant mediastinal shift is noted. IMPRESSION: Large left pneumothorax slightly increased when compared with the prior exam. Tubes and lines as described above. Electronically Signed   By: Alcide Clever M.D.   On: 12-Jun-2020 19:58   DG Chest Portable 1 View  Result Date: June 12, 2020 CLINICAL DATA:  79 year old female status post intubation. EXAM: PORTABLE CHEST 1 VIEW COMPARISON:  Chest radiograph dated 07/12/2019. FINDINGS: There is a large left pneumothorax measuring approximately 2.7 cm in thickness to the left lateral pleural surface and greater than 20%. The right lung is clear. The endotracheal tube with poorly visualized tip appears above the carina enteric tube extends below the diaphragm with tip beyond the inferior margin of the image. The cardiac silhouette is within limits. Atherosclerotic calcification of the aorta. No acute osseous pathology. IMPRESSION: 1. Large left pneumothorax. 2. Endotracheal tube above the carina. These results were called by telephone at the time of interpretation on 2020-06-12  at 7:34 pm to provider [facility] , who verbally acknowledged these results. Electronically Signed   By: Elgie Collard M.D.   On: 2020-06-12 19:40   DG Abd Portable 1 View  Result Date: 06-12-20 CLINICAL DATA:  Intubated EXAM: PORTABLE ABDOMEN - 1 VIEW COMPARISON:  None. FINDINGS: Supine  view of the upper abdomen demonstrates enteric catheter tip and side port projecting over the gastric body. Bowel gas pattern is unremarkable. Incidental note is made of a large left-sided pneumothorax with gas dissecting into the costophrenic angle and likely within the retroperitoneal space. IMPRESSION: 1. Enteric catheter as above. 2. Large left pneumothorax. This is Jackson visualized on corresponding chest x-ray. Critical Value/emergent results were called by telephone by Dr. Gwenyth Bender at the time of interpretation on 06-29-20 at 7:42 pm to provider Dorothea Glassman who verbally acknowledged these results. Electronically Signed   By: Sharlet Salina M.D.   On: June 29, 2020 19:42    ____________________________________________   PROCEDURES  Procedure(s) performed (including Critical Care): Critical care time 1-1/2 hours.  This includes intubating the patient evaluating the patient and spending a good deal of time speaking to the family about the patient's CODE STATUS and finally making her comfort care only.  Patient was extubated at the family's request with the family in the room.  Patient expired shortly thereafter.  Patient's family did not want a chest tube placed.  They did not want oxygen either.  Daughter and the sister were in the room with the patient when she expired.  Patient did have a no code order that was in place but apparently had not been renewed.  Procedures patient intubated using a glide scope with a #7 and half ET tube tube visualized going through the cords after suctioning was done to clear airway.  Good bilateral breath sounds in the axilla initially and then later decreased on the left.   Good color change.  Tube was pulled back a centimeter chest x-ray was examined and found to have a pneumothorax.  Please see below   ____________________________________________   INITIAL IMPRESSION / ASSESSMENT AND PLAN / ED COURSE  Urine was cloudy yellow no sign of blood in it After patient was intubated suction was done and a lot of milky white cheesy material was suctioned out of her long several times.  This was the same type of material that I had sucked out of her posterior pharynx with the [place] before intubating her. After discussing with the family and the chaplain present in detail the decision was made to make the patient comfort care only.  No chest tube was going to be placed.  Family did not want oxygen.  We had already started IV antibiotics and fluids.  Patient was then extubated and rapidly became hypoxic and expired dying at 8:32 PM.           ____________________________________________   FINAL CLINICAL IMPRESSION(S) / ED DIAGNOSES  Final diagnoses:  Sepsis, due to unspecified organism, unspecified whether acute organ dysfunction present [facility])  Urinary tract infection without hematuria, site unspecified  Aspiration pneumonia, unspecified aspiration pneumonia type, unspecified laterality, unspecified part of lung [facility])     ED Discharge Orders    None      *Please note:  Pam Jackson was evaluated in Emergency Department on 06/29/2020 for the symptoms described in the history of present illness. She was evaluated in the context of the global COVID-19 pandemic, which necessitated consideration that the patient might be at risk for infection with the SARS-CoV-2 virus that causes COVID-19. Institutional protocols and algorithms that pertain to the evaluation of patients at risk for COVID-19 are in a state of rapid change based on information released by regulatory bodies including the CDC and federal and state organizations. These policies and algorithms  were followed during the patient's care  in the ED.  Some ED evaluations and interventions may be delayed as a result of limited staffing during and the pandemic.*   Note:  This document was prepared using Dragon voice recognition software and may include unintentional dictation errors.    Arnaldo Natal, MD 2020/06/25 2029    Arnaldo Natal, MD 06-25-20 2033

## 2020-06-03 NOTE — ED Notes (Signed)
XRAY AT BEDSIDE

## 2020-06-03 NOTE — ED Notes (Signed)
ET tube withdrawn by 1cm by RT and MD Malinda.

## 2020-06-03 NOTE — ED Notes (Addendum)
Time of death declared at 2032. Asystole recording captured. Darnelle Catalan, MD notified.

## 2020-06-03 NOTE — ED Notes (Signed)
Family in to bedside with chaplan at this time.

## 2020-06-03 NOTE — ED Notes (Addendum)
Date and time results received: Jun 19, 2020    Test: Lactic Acid Critical Value: 6.7  Name of Provider Notified: Darnelle Catalan, MD

## 2020-06-03 NOTE — Progress Notes (Signed)
Patient extubated to room air/comfort measures by Dr. Darnelle Catalan.

## 2020-06-03 NOTE — ED Notes (Addendum)
Pt family decided to have Malinda, MD to remove tubing. Comfort measures initiated.

## 2020-06-03 DEATH — deceased
